# Patient Record
Sex: Female | Born: 1990 | Race: Black or African American | Hispanic: No | Marital: Single | Smoking: Never smoker
Health system: Southern US, Community
[De-identification: ages and names within clinical notes are randomized; demographics above are authoritative.]

## PROBLEM LIST (undated history)

## (undated) ENCOUNTER — Emergency Department (HOSPITAL_COMMUNITY): Discharge status: Against Medical Advice | Payer: BC Managed Care – PPO

## (undated) DIAGNOSIS — R87629 Unspecified abnormal cytological findings in specimens from vagina: Secondary | ICD-10-CM

## (undated) DIAGNOSIS — K219 Gastro-esophageal reflux disease without esophagitis: Secondary | ICD-10-CM

## (undated) DIAGNOSIS — Z8619 Personal history of other infectious and parasitic diseases: Secondary | ICD-10-CM

## (undated) HISTORY — PX: WISDOM TOOTH EXTRACTION: SHX21

---
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
## 2008-01-24 ENCOUNTER — Emergency Department (HOSPITAL_COMMUNITY): Admission: EM | Admit: 2008-01-24 | Discharge: 2008-01-24 | Payer: Self-pay | Admitting: Emergency Medicine

## 2008-07-31 ENCOUNTER — Emergency Department (HOSPITAL_COMMUNITY): Admission: EM | Admit: 2008-07-31 | Discharge: 2008-07-31 | Payer: Self-pay | Admitting: *Deleted

## 2008-08-02 ENCOUNTER — Emergency Department (HOSPITAL_COMMUNITY): Admission: EM | Admit: 2008-08-02 | Discharge: 2008-08-02 | Payer: Self-pay | Admitting: Emergency Medicine

## 2008-11-04 ENCOUNTER — Emergency Department (HOSPITAL_COMMUNITY): Admission: EM | Admit: 2008-11-04 | Discharge: 2008-11-04 | Payer: Self-pay | Admitting: Emergency Medicine

## 2008-11-05 ENCOUNTER — Emergency Department (HOSPITAL_COMMUNITY): Admission: EM | Admit: 2008-11-05 | Discharge: 2008-11-05 | Payer: Self-pay | Admitting: Emergency Medicine

## 2009-01-29 ENCOUNTER — Emergency Department (HOSPITAL_COMMUNITY): Admission: EM | Admit: 2009-01-29 | Discharge: 2009-01-30 | Payer: Self-pay | Admitting: Emergency Medicine

## 2009-02-28 ENCOUNTER — Emergency Department (HOSPITAL_COMMUNITY): Admission: EM | Admit: 2009-02-28 | Discharge: 2009-02-28 | Payer: Self-pay | Admitting: Emergency Medicine

## 2009-05-08 ENCOUNTER — Ambulatory Visit (HOSPITAL_COMMUNITY): Admission: RE | Admit: 2009-05-08 | Discharge: 2009-05-08 | Payer: Self-pay | Admitting: Obstetrics

## 2009-05-10 ENCOUNTER — Inpatient Hospital Stay (HOSPITAL_COMMUNITY): Admission: AD | Admit: 2009-05-10 | Discharge: 2009-05-10 | Payer: Self-pay | Admitting: Obstetrics

## 2009-07-22 ENCOUNTER — Ambulatory Visit (HOSPITAL_COMMUNITY): Admission: RE | Admit: 2009-07-22 | Discharge: 2009-07-22 | Payer: Self-pay | Admitting: Obstetrics

## 2009-10-01 ENCOUNTER — Inpatient Hospital Stay (HOSPITAL_COMMUNITY): Admission: AD | Admit: 2009-10-01 | Discharge: 2009-10-01 | Payer: Self-pay | Admitting: Obstetrics

## 2009-10-02 ENCOUNTER — Inpatient Hospital Stay (HOSPITAL_COMMUNITY)
Admission: AD | Admit: 2009-10-02 | Discharge: 2009-10-04 | Payer: Self-pay | Source: Home / Self Care | Admitting: Obstetrics

## 2009-10-02 ENCOUNTER — Encounter (INDEPENDENT_AMBULATORY_CARE_PROVIDER_SITE_OTHER): Payer: Self-pay | Admitting: Obstetrics

## 2010-06-21 LAB — RPR: RPR Ser Ql: NONREACTIVE

## 2010-06-21 LAB — CBC
HCT: 18.8 % — ABNORMAL LOW (ref 36.0–46.0)
HCT: 37 % (ref 36.0–46.0)
HCT: 40.4 % (ref 36.0–46.0)
Hemoglobin: 6.4 g/dL — CL (ref 12.0–15.0)
Hemoglobin: 12.3 g/dL (ref 12.0–15.0)
Hemoglobin: 13.4 g/dL (ref 12.0–15.0)
MCH: 26.7 pg (ref 26.0–34.0)
MCH: 26.7 pg (ref 26.0–34.0)
MCHC: 33.3 g/dL (ref 30.0–36.0)
MCV: 80.2 fL (ref 78.0–100.0)
MCV: 80.3 fL (ref 78.0–100.0)
RBC: 5.04 MIL/uL (ref 3.87–5.11)
WBC: 18.3 10*3/uL — ABNORMAL HIGH (ref 4.0–10.5)

## 2010-06-21 LAB — LACTATE DEHYDROGENASE: LDH: 225 U/L (ref 94–250)

## 2010-06-21 LAB — COMPREHENSIVE METABOLIC PANEL
ALT: 15 U/L (ref 0–35)
CO2: 23 mEq/L (ref 19–32)
Calcium: 8.6 mg/dL (ref 8.4–10.5)
GFR calc non Af Amer: >60 mL/min (ref >60)
Glucose, Bld: 78 mg/dL (ref 70–99)
Sodium: 134 mEq/L — ABNORMAL LOW (ref 135–145)

## 2010-06-21 LAB — MRSA PCR SCREENING: MRSA by PCR: NEGATIVE

## 2010-07-08 LAB — URINALYSIS, ROUTINE W REFLEX MICROSCOPIC
Bilirubin Urine: NEGATIVE
Hgb urine dipstick: NEGATIVE
Protein, ur: NEGATIVE mg/dL
Urobilinogen, UA: 1 mg/dL (ref 0.0–1.0)

## 2010-07-08 LAB — WET PREP, GENITAL
Clue Cells Wet Prep HPF POC: NONE SEEN
Trich, Wet Prep: NONE SEEN

## 2010-07-08 LAB — POCT PREGNANCY, URINE: Preg Test, Ur: POSITIVE

## 2010-07-08 LAB — HCG, QUANTITATIVE, PREGNANCY: hCG, Beta Chain, Quant, S: 153462 m[IU]/mL — ABNORMAL HIGH (ref <5)

## 2010-07-09 LAB — URINALYSIS, ROUTINE W REFLEX MICROSCOPIC
Nitrite: NEGATIVE
Protein, ur: NEGATIVE mg/dL
Urobilinogen, UA: 1 mg/dL (ref 0.0–1.0)

## 2010-07-09 LAB — WET PREP, GENITAL
Clue Cells Wet Prep HPF POC: NONE SEEN
Trich, Wet Prep: NONE SEEN
WBC, Wet Prep HPF POC: NONE SEEN
Yeast Wet Prep HPF POC: NONE SEEN

## 2010-07-09 LAB — RPR: RPR Ser Ql: NONREACTIVE

## 2010-07-11 LAB — URINALYSIS, ROUTINE W REFLEX MICROSCOPIC
Bilirubin Urine: NEGATIVE
Ketones, ur: NEGATIVE mg/dL
Nitrite: NEGATIVE
Protein, ur: NEGATIVE mg/dL
pH: 6 (ref 5.0–8.0)

## 2010-07-11 LAB — WET PREP, GENITAL: Yeast Wet Prep HPF POC: NONE SEEN

## 2010-07-11 LAB — GC/CHLAMYDIA PROBE AMP, GENITAL
Chlamydia, DNA Probe: NEGATIVE
GC Probe Amp, Genital: POSITIVE — AB

## 2010-07-11 LAB — URINE MICROSCOPIC-ADD ON

## 2010-07-15 LAB — COMPREHENSIVE METABOLIC PANEL
ALT: 27 U/L (ref 0–35)
AST: 21 U/L (ref 0–37)
Albumin: 4 g/dL (ref 3.5–5.2)
Albumin: 4 g/dL (ref 3.5–5.2)
Alkaline Phosphatase: 56 U/L (ref 39–117)
Alkaline Phosphatase: 65 U/L (ref 39–117)
BUN: 6 mg/dL (ref 6–23)
BUN: 11 mg/dL (ref 6–23)
Chloride: 110 mEq/L (ref 96–112)
Chloride: 103 mEq/L (ref 96–112)
Creatinine, Ser: 1.1 mg/dL (ref 0.4–1.2)
GFR calc Af Amer: >60 mL/min (ref >60)
Glucose, Bld: 106 mg/dL — ABNORMAL HIGH (ref 70–99)
Potassium: 3.6 mEq/L (ref 3.5–5.1)
Potassium: 3.2 mEq/L — ABNORMAL LOW (ref 3.5–5.1)
Sodium: 142 mEq/L (ref 135–145)
Total Bilirubin: 0.5 mg/dL (ref 0.3–1.2)
Total Bilirubin: 0.4 mg/dL (ref 0.3–1.2)
Total Protein: 7.1 g/dL (ref 6.0–8.3)
Total Protein: 7.9 g/dL (ref 6.0–8.3)

## 2010-07-15 LAB — DIFFERENTIAL
Basophils Absolute: 0.1 10*3/uL (ref 0.0–0.1)
Basophils Absolute: 0 10*3/uL (ref 0.0–0.1)
Basophils Relative: 1 % (ref 0–1)
Basophils Relative: 1 % (ref 0–1)
Eosinophils Absolute: 0 10*3/uL (ref 0.0–0.7)
Eosinophils Relative: 0 % (ref 0–5)
Lymphocytes Relative: 43 % (ref 12–46)
Monocytes Absolute: 0.2 10*3/uL (ref 0.1–1.0)
Monocytes Relative: 4 % (ref 3–12)
Neutro Abs: 5.3 10*3/uL (ref 1.7–7.7)
Neutro Abs: 2.9 10*3/uL (ref 1.7–7.7)
Neutrophils Relative %: 43 % (ref 43–77)

## 2010-07-15 LAB — CBC
HCT: 43.4 % (ref 36.0–46.0)
Hemoglobin: 13.3 g/dL (ref 12.0–15.0)
Hemoglobin: 14.2 g/dL (ref 12.0–15.0)
MCHC: 32.9 g/dL (ref 30.0–36.0)
MCV: 79.9 fL (ref 78.0–100.0)
MCV: 80.5 fL (ref 78.0–100.0)
Platelets: 315 10*3/uL (ref 150–400)
RBC: 5.05 MIL/uL (ref 3.87–5.11)
RDW: 13.5 % (ref 11.5–15.5)
RDW: 13.7 % (ref 11.5–15.5)

## 2010-07-15 LAB — URINALYSIS, ROUTINE W REFLEX MICROSCOPIC
Bilirubin Urine: NEGATIVE
Glucose, UA: NEGATIVE mg/dL
Hgb urine dipstick: NEGATIVE
Ketones, ur: NEGATIVE mg/dL
Leukocytes, UA: NEGATIVE
Nitrite: NEGATIVE
Protein, ur: NEGATIVE mg/dL
Protein, ur: NEGATIVE mg/dL
Specific Gravity, Urine: 1.031 — ABNORMAL HIGH (ref 1.005–1.030)
Urobilinogen, UA: 1 mg/dL (ref 0.0–1.0)
pH: 7.5 (ref 5.0–8.0)

## 2010-07-15 LAB — URINE MICROSCOPIC-ADD ON

## 2010-07-15 LAB — POCT I-STAT, CHEM 8
BUN: 11 mg/dL (ref 6–23)
Calcium, Ion: 1.15 mmol/L (ref 1.12–1.32)
Chloride: 104 mEq/L (ref 96–112)
Creatinine, Ser: 1.4 mg/dL — ABNORMAL HIGH (ref 0.4–1.2)
Glucose, Bld: 106 mg/dL — ABNORMAL HIGH (ref 70–99)

## 2011-12-17 ENCOUNTER — Encounter (HOSPITAL_COMMUNITY): Payer: Self-pay

## 2011-12-17 ENCOUNTER — Emergency Department (INDEPENDENT_AMBULATORY_CARE_PROVIDER_SITE_OTHER)
Admission: EM | Admit: 2011-12-17 | Discharge: 2011-12-17 | Disposition: A | Discharge status: Home / Self Care | Payer: Self-pay | Source: Home / Self Care

## 2011-12-17 DIAGNOSIS — A084 Viral intestinal infection, unspecified: Secondary | ICD-10-CM

## 2011-12-17 DIAGNOSIS — A088 Other specified intestinal infections: Secondary | ICD-10-CM

## 2011-12-17 MED ORDER — ONDANSETRON HCL 4 MG PO TABS: 4.0000 mg | ORAL_TABLET | Freq: Four times a day (QID) | ORAL | Status: AC

## 2011-12-17 NOTE — ED Notes (Signed)
C/o vomiting, diarrhea and headache since [place].  States her daughter is just getting over diarrhea but no vomiting.  Pt last vomited 2 hours ago and last diarrhea was 1 hour ago.  States some abdominal cramping when she has diarrhea or vomiting

## 2011-12-17 NOTE — ED Provider Notes (Signed)
History     CSN: [id number]  Arrival date & time 12/17/11  1254   None     Chief Complaint  Patient presents with  . Emesis  . Diarrhea    (Consider location/radiation/quality/duration/timing/severity/associated sxs/prior treatment) HPI Comments: Vomiting x 4 adn diarrheal stools x 7 since onset last PM. Her baby recently had diarrhea.   Patient is a 21 y.o. female presenting with vomiting and diarrhea. The history is provided by the patient.  Emesis  This is a new problem. The current episode started 6 to 12 hours ago. The problem occurs 5 to 10 times per day. Associated symptoms include diarrhea and headaches. Pertinent negatives include no abdominal pain, no chills, no cough, no fever, no myalgias, no sweats and no URI.  Diarrhea The primary symptoms include vomiting and diarrhea. Primary symptoms do not include fever, abdominal pain or myalgias.  The illness does not include chills.    History reviewed. No pertinent past medical history.  History reviewed. No pertinent past surgical history.  No family history on file.  History  Substance Use Topics  . Smoking status: Never Smoker   . Smokeless tobacco: Not on file  . Alcohol Use: No    OB History    Grav Para Term Preterm Abortions TAB SAB Ect Mult Living                  Review of Systems  Constitutional: Negative for fever and chills.  Respiratory: Negative for cough and shortness of breath.   Gastrointestinal: Positive for vomiting and diarrhea. Negative for abdominal pain.       Per HPI  Genitourinary: Negative.   Musculoskeletal: Negative.  Negative for myalgias.  Skin: Negative.   Neurological: Positive for weakness and headaches. Negative for dizziness and numbness.    Allergies  Review of patient's allergies indicates no known allergies.  Home Medications   Current Outpatient Rx  Name Route Sig Dispense Refill  . ONDANSETRON HCL 4 MG PO TABS Oral Take 1 tablet (4 mg total) by mouth every 6  (six) hours. 12 tablet 0    BP 116/75  Pulse 85  Temp 98.1 F (36.7 C) (Oral)  Resp 16  SpO2 97%  Physical Exam  Constitutional: She is oriented to person, place, and time. She appears well-developed and well-nourished. No distress.  HENT:  Mouth/Throat: Oropharynx is clear and moist. No oropharyngeal exudate.  Neck: Normal range of motion. Neck supple.  Cardiovascular: Normal rate and normal heart sounds.   Pulmonary/Chest: Effort normal and breath sounds normal. No respiratory distress.  Abdominal: Soft. She exhibits no distension and no mass. There is no tenderness. There is no rebound and no guarding.  Musculoskeletal: Normal range of motion. She exhibits no edema.  Lymphadenopathy:    She has no cervical adenopathy.  Neurological: She is alert and oriented to person, place, and time. No cranial nerve deficit.  Skin: Skin is warm and dry.    ED Course  Procedures (including critical care time)  Labs Reviewed - No data to display No results found.   1. Viral gastroenteritis       MDM  No working for 3 d immodium AD to slow but not stop diarrhea BRAT diet Zofran 4mg  for nausea Pedialyte, small frequent amounts.         Hayden Rasmussen, NP 12/17/11 1345

## 2011-12-18 NOTE — ED Provider Notes (Signed)
Medical screening examination/treatment/procedure(s) were performed by non-physician practitioner and as supervising physician I was immediately available for consultation/collaboration.  Raynald Blend, MD 12/18/11 1145

## 2011-12-26 ENCOUNTER — Encounter (HOSPITAL_COMMUNITY): Payer: Self-pay | Admitting: Emergency Medicine

## 2011-12-26 ENCOUNTER — Emergency Department (HOSPITAL_COMMUNITY)
Admission: EM | Admit: 2011-12-26 | Discharge: 2011-12-26 | Disposition: A | Discharge status: Home / Self Care | Payer: Self-pay | Attending: Emergency Medicine | Admitting: Emergency Medicine

## 2011-12-26 DIAGNOSIS — J069 Acute upper respiratory infection, unspecified: Secondary | ICD-10-CM | POA: Insufficient documentation

## 2011-12-26 MED ORDER — IBUPROFEN 800 MG PO TABS: 800.0000 mg | ORAL_TABLET | Freq: Three times a day (TID) | ORAL | Status: DC

## 2011-12-26 NOTE — ED Notes (Signed)
Pt reports new onset of cough that is causing chest wall and back pain

## 2011-12-26 NOTE — ED Provider Notes (Signed)
History     CSN: [id number]  Arrival date & time 12/26/11  1350   First MD Initiated Contact with Patient 12/26/11 1505      Chief Complaint  Patient presents with  . Back Pain  . Cough  . Chest Pain    with cough  . Chills    x 1 day    (Consider location/radiation/quality/duration/timing/severity/associated sxs/prior treatment) HPI Comments: 21 year old female presents the emergency department complaining of flulike symptoms as of last night. States she began having a slightly productive cough of clear phlegm last night that's causing chest pain only which coughs. Admits to bodyaches and fever of 100 last night. She has not tried any alleviating factors. Shortness of breath, nausea, vomiting, diarrhea, sore throat. Denies any sick contacts.  Patient is a 21 y.o. female presenting with back pain, cough, and chest pain. The history is provided by the patient.  Back Pain  Associated symptoms include chest pain and a fever. Pertinent negatives include no headaches.  Cough Associated symptoms include chest pain. Pertinent negatives include no ear pain, no headaches, no sore throat and no shortness of breath.  Chest Pain Primary symptoms include a fever, fatigue and cough. Pertinent negatives for primary symptoms include no shortness of breath, no nausea and no vomiting.     History reviewed. No pertinent past medical history.  History reviewed. No pertinent past surgical history.  Family History  Problem Relation Age of Onset  . Hypertension Other     History  Substance Use Topics  . Smoking status: Never Smoker   . Smokeless tobacco: Not on file  . Alcohol Use: No    OB History    Grav Para Term Preterm Abortions TAB SAB Ect Mult Living                  Review of Systems  Constitutional: Positive for fever, appetite change and fatigue.  HENT: Negative for ear pain, congestion, sore throat, neck pain, neck stiffness and sinus pressure.   Respiratory: Positive for  cough. Negative for shortness of breath.   Cardiovascular: Positive for chest pain.  Gastrointestinal: Negative for nausea and vomiting.  Musculoskeletal: Positive for arthralgias.  Skin: Negative for color change and rash.  Neurological: Negative for headaches.    Allergies  Review of patient's allergies indicates no known allergies.  Home Medications  No current outpatient prescriptions on file.  BP 126/82  Pulse 109  Temp 99.1 F (37.3 C) (Oral)  Resp 18  SpO2 97%  Physical Exam  Nursing note and vitals reviewed. Constitutional: She is oriented to person, place, and time. She appears well-developed and well-nourished. No distress.  HENT:  Head: Normocephalic and atraumatic.  Nose: Mucosal edema (buggy turbinates) present.  Mouth/Throat: Oropharynx is clear and moist and mucous membranes are normal. No oropharyngeal exudate or posterior oropharyngeal edema.  Eyes: Conjunctivae normal are normal.  Neck: Normal range of motion. Neck supple.  Cardiovascular: Normal rate, regular rhythm and normal heart sounds.   Pulmonary/Chest: Effort normal and breath sounds normal. She has no wheezes.  Abdominal: Soft. Bowel sounds are normal. There is no tenderness.  Musculoskeletal: Normal range of motion.  Lymphadenopathy:       Head (right side): No submandibular and no tonsillar adenopathy present.       Head (left side): No submandibular and no tonsillar adenopathy present.    She has no cervical adenopathy.  Neurological: She is alert and oriented to person, place, and time.  Skin: Skin is warm  and dry. No rash noted. She is not diaphoretic.  Psychiatric: She has a normal mood and affect. Her behavior is normal.    ED Course  Procedures (including critical care time)  Labs Reviewed - No data to display No results found.   1. Viral upper respiratory illness       MDM  21 year old female with flulike symptoms. She is in no apparent distress. No signs of infection on  exam. Lungs are clear. No cough during examination. Advised conservative treatment including Tylenol, ibuprofen, lots of fluids. Patient states her understanding and is agreeable to plan.        Trevor Mace, PA-C 12/26/11 1533

## 2011-12-28 NOTE — ED Provider Notes (Signed)
Medical screening examination/treatment/procedure(s) were performed by non-physician practitioner and as supervising physician I was immediately available for consultation/collaboration.  Flint Melter, MD 12/28/11 [phone]

## 2011-12-31 ENCOUNTER — Emergency Department (HOSPITAL_COMMUNITY): Payer: Self-pay

## 2011-12-31 ENCOUNTER — Emergency Department (HOSPITAL_COMMUNITY)
Admission: EM | Admit: 2011-12-31 | Discharge: 2011-12-31 | Disposition: A | Discharge status: Home / Self Care | Payer: Self-pay | Attending: Emergency Medicine | Admitting: Emergency Medicine

## 2011-12-31 DIAGNOSIS — M25519 Pain in unspecified shoulder: Secondary | ICD-10-CM | POA: Insufficient documentation

## 2011-12-31 DIAGNOSIS — S139XXA Sprain of joints and ligaments of unspecified parts of neck, initial encounter: Secondary | ICD-10-CM | POA: Insufficient documentation

## 2011-12-31 DIAGNOSIS — S335XXA Sprain of ligaments of lumbar spine, initial encounter: Secondary | ICD-10-CM | POA: Insufficient documentation

## 2011-12-31 DIAGNOSIS — M549 Dorsalgia, unspecified: Secondary | ICD-10-CM

## 2011-12-31 MED ORDER — CYCLOBENZAPRINE HCL 10 MG PO TABS: 10.0000 mg | ORAL_TABLET | Freq: Two times a day (BID) | ORAL | Status: DC | PRN

## 2011-12-31 MED ORDER — IBUPROFEN 800 MG PO TABS: 800.0000 mg | ORAL_TABLET | Freq: Three times a day (TID) | ORAL | Status: DC
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
## 2011-12-31 NOTE — ED Provider Notes (Signed)
Medical screening examination/treatment/procedure(s) were conducted as a shared visit with non-physician practitioner(s) and myself.  I personally evaluated the patient during the encounter  With the patient has mild tenderness to her chest wall bilaterally and requested chest x-ray, she is no cervical spine midline tenderness no midline back tenderness she has mild paracervical and trapezius soft tissue tenderness with minimal if any tenderness to her bilateral a.c. joints with good active range of motion of her shoulders was negative drop test of her shoulders. CXR ordered.  Hurman Horn, MD 01/01/12 2117
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
## 2011-12-31 NOTE — ED Notes (Signed)
MD at bedside. 
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
## 2012-01-12 ENCOUNTER — Emergency Department (INDEPENDENT_AMBULATORY_CARE_PROVIDER_SITE_OTHER)
Admission: EM | Admit: 2012-01-12 | Discharge: 2012-01-12 | Disposition: A | Discharge status: Home / Self Care | Payer: BC Managed Care – PPO | Source: Home / Self Care

## 2012-01-12 ENCOUNTER — Encounter (HOSPITAL_COMMUNITY): Payer: Self-pay | Admitting: *Deleted

## 2012-01-12 DIAGNOSIS — L259 Unspecified contact dermatitis, unspecified cause: Secondary | ICD-10-CM

## 2012-01-12 MED ORDER — METHYLPREDNISOLONE 4 MG PO KIT: PACK | ORAL | Status: DC

## 2012-01-12 MED ORDER — TRIAMCINOLONE ACETONIDE 40 MG/ML IJ SUSP
40.0000 mg | Freq: Once | INTRAMUSCULAR | Status: AC
  Administered 2012-01-12: 40 mg via INTRAMUSCULAR

## 2012-01-12 MED ORDER — TRIAMCINOLONE ACETONIDE 0.1 % EX CREA: TOPICAL_CREAM | Freq: Two times a day (BID) | CUTANEOUS | Status: DC

## 2012-01-12 MED ORDER — TRIAMCINOLONE ACETONIDE 40 MG/ML IJ SUSP
INTRAMUSCULAR | Status: AC
  Filled 2012-01-12: qty 5

## 2012-01-12 NOTE — ED Notes (Signed)
Red raised rash to L posterior shoulder and L lat chest wall onset 2 weeks ago with itching.  No pain.  States warm water makes it worse.

## 2012-01-12 NOTE — ED Provider Notes (Signed)
History     CSN: [id number]  Arrival date & time 01/12/12  1808   None     No chief complaint on file.   (Consider location/radiation/quality/duration/timing/severity/associated sxs/prior treatment) HPI Comments: 21 year old female presents with a rash for 2 weeks. Is a highly pruritic rash located on the left neck and upper back. It consists primarily of isolated papules some crops closely together in an annular pattern and extends from the left upper back to the left lower posterior lateral back. She denies other body surface areas with itching or rash. She denies any known exposure to allergens. She does have a long hair summer which in dreadlocks that extend down to the end of the rash. This was initially appear to be a potential calls from hair chemicals however it is not present on the right side of the back. There are no vesicles and there is no pain or tenderness.   No past medical history on file.  No past surgical history on file.  Family History  Problem Relation Age of Onset  . Hypertension Other     History  Substance Use Topics  . Smoking status: Never Smoker   . Smokeless tobacco: Not on file  . Alcohol Use: No    OB History    Grav Para Term Preterm Abortions TAB SAB Ect Mult Living                  Review of Systems  Constitutional: Negative for fever, activity change and fatigue.  HENT: Negative.   Respiratory: Negative.   Gastrointestinal: Negative.   Musculoskeletal: Negative.   Skin: Positive for rash.    Allergies  Review of patient's allergies indicates no known allergies.  Home Medications   Current Outpatient Rx  Name Route Sig Dispense Refill  . CYCLOBENZAPRINE HCL 10 MG PO TABS Oral Take 1 tablet (10 mg total) by mouth 2 (two) times daily as needed for muscle spasms. 20 tablet 0  . IBUPROFEN 800 MG PO TABS Oral Take 1 tablet (800 mg total) by mouth 3 (three) times daily. 21 tablet 0  . METHYLPREDNISOLONE 4 MG PO KIT  As directed.  Start 01/13/12 21 tablet 0  . TRIAMCINOLONE ACETONIDE 0.1 % EX CREA Topical Apply topically 2 (two) times daily. Apply for 2 weeks. May use on face 30 g 0    BP 105/65  Pulse 80  Temp 98.4 F (36.9 C) (Oral)  Resp 16  SpO2 100%  Physical Exam  Constitutional: She is oriented to person, place, and time. She appears well-developed and well-nourished. No distress.  Eyes: EOM are normal. Right eye exhibits no discharge. Left eye exhibits no discharge.  Neck: Normal range of motion. Neck supple.  Pulmonary/Chest: Effort normal.  Musculoskeletal: Normal range of motion. She exhibits no edema.  Neurological: She is alert and oriented to person, place, and time. No cranial nerve deficit. She exhibits normal muscle tone. Coordination normal.  Skin: Skin is warm and dry. Rash noted. No erythema.       Flesh-colored papular rash left upper back extending to the left lower posterior lateral back. The left upper back and shoulder as a dancer population of papules some in annular form. There is no flaking or desquamation or central clearing. No rash to the body surface areas.  Psychiatric: She has a normal mood and affect.    ED Course  Procedures (including critical care time)  Labs Reviewed - No data to display No results found.   1. Contact  dermatitis       MDM  Kenalog 40 mg IM now Medrol Dosepak start tomorrow Triamcinolone 0.1% cream apply to affected area twice a day Claritin 10 mg by mouth daily when necessary itching.        Hayden Rasmussen, NP 01/12/12 1906

## 2012-01-12 NOTE — ED Provider Notes (Signed)
Medical screening examination/treatment/procedure(s) were performed by non-physician practitioner and as supervising physician I was immediately available for consultation/collaboration.  Leslee Home, M.D.   Reuben Likes, MD 01/12/12 2256

## 2012-01-12 NOTE — ED Notes (Signed)
States she thought it was ringworm and applied Tinactin cream for athletes foot without relief.

## 2012-08-09 ENCOUNTER — Emergency Department (HOSPITAL_COMMUNITY): Payer: BC Managed Care – PPO

## 2012-08-09 ENCOUNTER — Encounter (HOSPITAL_COMMUNITY): Payer: Self-pay | Admitting: *Deleted

## 2012-08-09 ENCOUNTER — Emergency Department (HOSPITAL_COMMUNITY)
Admission: EM | Admit: 2012-08-09 | Discharge: 2012-08-09 | Disposition: A | Discharge status: Home / Self Care | Payer: BC Managed Care – PPO | Attending: Emergency Medicine | Admitting: Emergency Medicine

## 2012-08-09 DIAGNOSIS — K219 Gastro-esophageal reflux disease without esophagitis: Secondary | ICD-10-CM | POA: Insufficient documentation

## 2012-08-09 DIAGNOSIS — R12 Heartburn: Secondary | ICD-10-CM | POA: Insufficient documentation

## 2012-08-09 MED ORDER — GI COCKTAIL ~~LOC~~
30.0000 mL | Freq: Once | ORAL | Status: AC
  Administered 2012-08-09: 30 mL via ORAL
  Filled 2012-08-09: qty 30

## 2012-08-09 MED ORDER — RANITIDINE HCL 150 MG PO TABS: 150.0000 mg | ORAL_TABLET | Freq: Two times a day (BID) | ORAL | Status: DC

## 2012-08-09 NOTE — ED Notes (Signed)
MD at bedside. Dr. Plunkett at bedside.  
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
## 2012-08-09 NOTE — ED Provider Notes (Signed)
History     CSN: [id number]  Arrival date & time 08/09/12  1318   First MD Initiated Contact with Patient 08/09/12 1552      Chief Complaint  Patient presents with  . Chest Pain    (Consider location/radiation/quality/duration/timing/severity/associated sxs/prior treatment) Patient is a 22 y.o. female presenting with chest pain. The history is provided by the patient.  Chest Pain Pain location:  Substernal area Pain quality: burning   Pain radiates to:  Does not radiate Pain radiates to the back: no   Pain severity:  Moderate Onset quality:  Gradual Duration:  1 week Timing:  Constant Progression:  Worsening Chronicity:  Recurrent Context: eating   Context: not breathing, not lifting and no movement   Relieved by:  Nothing Exacerbated by: eating and lying down. Ineffective treatments:  None tried Associated symptoms: heartburn   Associated symptoms: no abdominal pain, no cough, no fever, no headache, no lower extremity edema, no nausea, no palpitations, no shortness of breath and not vomiting   Risk factors: birth control   Risk factors: no coronary artery disease, no high cholesterol, no hypertension, not obese, not pregnant and no smoking     History reviewed. No pertinent past medical history.  History reviewed. No pertinent past surgical history.  Family History  Problem Relation Age of Onset  . Hypertension Other     History  Substance Use Topics  . Smoking status: Never Smoker   . Smokeless tobacco: Not on file  . Alcohol Use: No    OB History   Grav Para Term Preterm Abortions TAB SAB Ect Mult Living                  Review of Systems  Constitutional: Negative for fever.  Respiratory: Negative for cough and shortness of breath.   Cardiovascular: Positive for chest pain. Negative for palpitations.  Gastrointestinal: Positive for heartburn. Negative for nausea, vomiting and abdominal pain.  Neurological: Negative for headaches.  All other systems  reviewed and are negative.    Allergies  Review of patient's allergies indicates no known allergies.  Home Medications   Current Outpatient Rx  Name  Route  Sig  Dispense  Refill  . cyclobenzaprine (FLEXERIL) 10 MG tablet   Oral   Take 1 tablet (10 mg total) by mouth 2 (two) times daily as needed for muscle spasms.   20 tablet   0   . ibuprofen (ADVIL,MOTRIN) 800 MG tablet   Oral   Take 1 tablet (800 mg total) by mouth 3 (three) times daily.   21 tablet   0   . methylPREDNISolone (MEDROL DOSEPAK) 4 MG tablet      As directed. Start 01/13/12   21 tablet   0   . triamcinolone cream (KENALOG) 0.1 %   Topical   Apply topically 2 (two) times daily. Apply for 2 weeks. May use on face   30 g   0     BP 108/72  Pulse 68  Temp(Src) 99.4 F (37.4 C) (Oral)  Resp 20  Wt 120 lb (54.432 kg)  SpO2 100%  Physical Exam  Nursing note and vitals reviewed. Constitutional: She is oriented to person, place, and time. She appears well-developed and well-nourished. No distress.  HENT:  Head: Normocephalic and atraumatic.  Mouth/Throat: Oropharynx is clear and moist.  Eyes: Conjunctivae and EOM are normal. Pupils are equal, round, and reactive to light.  Neck: Normal range of motion. Neck supple.  Cardiovascular: Normal rate, regular rhythm  and intact distal pulses.   No murmur heard. Pulmonary/Chest: Effort normal and breath sounds normal. No respiratory distress. She has no wheezes. She has no rales.  Abdominal: Soft. She exhibits no distension. There is no tenderness. There is no rebound and no guarding.  Musculoskeletal: Normal range of motion. She exhibits no edema and no tenderness.  Neurological: She is alert and oriented to person, place, and time.  Skin: Skin is warm and dry. No rash noted. No erythema.  Psychiatric: She has a normal mood and affect. Her behavior is normal.    ED Course  Procedures (including critical care time)  Labs Reviewed  POCT I-STAT TROPONIN  I   Dg Chest 2 View  08/09/2012  *RADIOLOGY REPORT*  Clinical Data: Chest pain  CHEST - 2 VIEW  Comparison:  December 31, 2011  Findings:  The lungs are clear.  The heart size and pulmonary vascularity are normal.  No adenopathy.  There is mid thoracic dextroscoliosis.  No pneumothorax.  IMPRESSION: No edema or consolidation.   Original Report Authenticated By: Bretta Bang, M.D.      Date: 08/09/2012  Rate: 62  Rhythm: normal sinus rhythm  QRS Axis: right  Intervals: normal  ST/T Wave abnormalities: normal  Conduction Disutrbances:none  Narrative Interpretation:   Old EKG Reviewed: none available   No diagnosis found.    MDM   Patient is here complaining of a burning sensation in her substernal area that is worse with eating spicy foods and heavy meals. She denies any shortness of breath, cough or fever. She states this has been ongoing for about one week and the only thing that makes this worse his eating and lying down. She denies any history of NSAID, aspirin goody powder use recently.  Normal-appearing with no epigastric pain. Patient is much improved after GI cocktail. Will be discharged home with Zantac. EKG is within normal limits, chest x-ray is normal an i-STAT troponin done in triage was negative        Gwyneth Sprout, MD 08/09/12 1610

## 2012-08-09 NOTE — ED Notes (Signed)
Pt c/o sternal/right chest pain x's 1 week. Reports hx of acid reflux. Reports pain is burning and tight.

## 2013-08-27 ENCOUNTER — Emergency Department (INDEPENDENT_AMBULATORY_CARE_PROVIDER_SITE_OTHER)
Admission: EM | Admit: 2013-08-27 | Discharge: 2013-08-27 | Disposition: A | Discharge status: Home / Self Care | Payer: Medicaid Other | Source: Home / Self Care

## 2013-08-27 ENCOUNTER — Encounter (HOSPITAL_COMMUNITY): Payer: Self-pay | Admitting: Emergency Medicine

## 2013-08-27 DIAGNOSIS — M25572 Pain in left ankle and joints of left foot: Secondary | ICD-10-CM

## 2013-08-27 DIAGNOSIS — M25579 Pain in unspecified ankle and joints of unspecified foot: Secondary | ICD-10-CM | POA: Diagnosis not present

## 2013-08-27 MED ORDER — DICLOFENAC POTASSIUM 50 MG PO TABS: 50.0000 mg | ORAL_TABLET | Freq: Three times a day (TID) | ORAL | Status: DC

## 2013-08-27 MED ORDER — TRAMADOL HCL 50 MG PO TABS: 50.0000 mg | ORAL_TABLET | Freq: Four times a day (QID) | ORAL | Status: DC | PRN

## 2013-08-27 NOTE — ED Provider Notes (Signed)
CSN: [id number]     Arrival date & time 08/27/13  1122 History   First MD Initiated Contact with Patient 08/27/13 1316     Chief Complaint  Patient presents with  . Foot Pain   (Consider location/radiation/quality/duration/timing/severity/associated sxs/prior Treatment) HPI Comments: 23 year old female is complaining of left ankle pain for 24 hours. She denies any known injury. She did not twist her ankle, fall or injure it in any way she is aware of. Her job requires her to stand 8 hours a day 5 days a week. She started this new job 4 weeks ago. She states that she wears a walking type cheese and not high heels. Pain is located in the ankle. There is no pain and the forefoot or over the metatarsals. Yesterday she states that she had full range of motion, but  today it is too painful to obtain a range of motion.   History reviewed. No pertinent past medical history. History reviewed. No pertinent past surgical history. Family History  Problem Relation Age of Onset  . Hypertension Other    History  Substance Use Topics  . Smoking status: Never Smoker   . Smokeless tobacco: Not on file  . Alcohol Use: No   OB History   Grav Para Term Preterm Abortions TAB SAB Ect Mult Living                 Review of Systems  Constitutional: Positive for activity change. Negative for fever.  HENT: Negative.   Respiratory: Negative for shortness of breath.   Musculoskeletal:       As per HPI  Skin: Negative for color change and pallor.  Neurological: Negative.     Allergies  Review of patient's allergies indicates no known allergies.  Home Medications   Prior to Admission medications   Medication Sig Start Date End Date Taking? Authorizing Provider  cyclobenzaprine (FLEXERIL) 10 MG tablet Take 1 tablet (10 mg total) by mouth 2 (two) times daily as needed for muscle spasms. 12/31/11   Trevor Maceobyn M Albert, PA-C  ibuprofen (ADVIL,MOTRIN) 800 MG tablet Take 1 tablet (800 mg total) by mouth 3 (three)  times daily. 12/31/11   Trevor Maceobyn M Albert, PA-C  methylPREDNISolone (MEDROL DOSEPAK) 4 MG tablet As directed. Start 01/13/12 01/12/12   Hayden Rasmussenavid Charl Wellen, NP  ranitidine (ZANTAC) 150 MG tablet Take 1 tablet (150 mg total) by mouth 2 (two) times daily. 08/09/12   Gwyneth SproutWhitney Plunkett, MD  triamcinolone cream (KENALOG) 0.1 % Apply topically 2 (two) times daily. Apply for 2 weeks. May use on face 01/12/12   Hayden Rasmussenavid Jacquise Rarick, NP   BP 124/66  Pulse 82  Temp(Src) 99 F (37.2 C) (Oral)  Resp 16  SpO2 100%  LMP 08/07/2013 Physical Exam  Nursing note and vitals reviewed. Constitutional: She is oriented to person, place, and time. She appears well-developed and well-nourished. No distress.  HENT:  Head: Normocephalic and atraumatic.  Eyes: EOM are normal.  Neck: Normal range of motion. Neck supple.  Cardiovascular: Normal rate.   Pulmonary/Chest: Effort normal. No respiratory distress.  Musculoskeletal:  Puffiness surrounding the medial and lateral malleolus. No bony tenderness. Tenderness to the soft tissues of the ankle. Tenderness to the Achilles tendon. No discoloration, ecchymosis or erythema. No deformity. Distal neurovascular and sensory is intact. Able to move her toes. Toes are warm with brisk capillary refill. Pedal pulse 2+.  Neurological: She is alert and oriented to person, place, and time. No cranial nerve deficit.  Skin: Skin is warm and dry.  ED Course  Procedures (including critical care time) Labs Review Labs Reviewed - No data to display  Imaging Review No results found.   MDM   1. Left ankle pain     Probably overuse injury, new job with prolonged standing. Possibly may have experienced a mild sprain not noticed yesterday and after walking on it remainder of day it has manifested itself.  RICE ACE      Hayden Rasmussen, NP 08/27/13 1335

## 2013-08-27 NOTE — ED Notes (Signed)
C/o left foot pain.  On set yesterday.  Pt states that she started a new job and has been on her feet a lot.  Denies any known injury.  No relief with otc pain meds.

## 2013-08-27 NOTE — Discharge Instructions (Signed)
Ankle Pain Rest, ice, elevation, limit weight bearing for a few days. Ankle pain is a common symptom. The bones, cartilage, tendons, and muscles of the ankle joint perform a lot of work each day. The ankle joint holds your body weight and allows you to move around. Ankle pain can occur on either side or back of 1 or both ankles. Ankle pain may be sharp and burning or dull and aching. There may be tenderness, stiffness, redness, or warmth around the ankle. The pain occurs more often when a person walks or puts pressure on the ankle. CAUSES  There are many reasons ankle pain can develop. It is important to work with your caregiver to identify the cause since many conditions can impact the bones, cartilage, muscles, and tendons. Causes for ankle pain include:  Injury, including a break (fracture), sprain, or strain often due to a fall, sports, or a high-impact activity.  Swelling (inflammation) of a tendon (tendonitis).  Achilles tendon rupture.  Ankle instability after repeated sprains and strains.  Poor foot alignment.  Pressure on a nerve (tarsal tunnel syndrome).  Arthritis in the ankle or the lining of the ankle.  Crystal formation in the ankle (gout or pseudogout). DIAGNOSIS  A diagnosis is based on your medical history, your symptoms, results of your physical exam, and results of diagnostic tests. Diagnostic tests may include X-ray exams or a computerized magnetic scan (magnetic resonance imaging, MRI). TREATMENT  Treatment will depend on the cause of your ankle pain and may include:  Keeping pressure off the ankle and limiting activities.  Using crutches or other walking support (a cane or brace).  Using rest, ice, compression, and elevation.  Participating in physical therapy or home exercises.  Wearing shoe inserts or special shoes.  Losing weight.  Taking medications to reduce pain or swelling or receiving an injection.  Undergoing surgery. HOME CARE INSTRUCTIONS    Only take over-the-counter or prescription medicines for pain, discomfort, or fever as directed by your caregiver.  Put ice on the injured area.  Put ice in a plastic bag.  Place a towel between your skin and the bag.  Leave the ice on for 15-20 minutes at a time, 03-04 times a day.  Keep your leg raised (elevated) when possible to lessen swelling.  Avoid activities that cause ankle pain.  Follow specific exercises as directed by your caregiver.  Record how often you have ankle pain, the location of the pain, and what it feels like. This information may be helpful to you and your caregiver.  Ask your caregiver about returning to work or sports and whether you should drive.  Follow up with your caregiver for further examination, therapy, or testing as directed. SEEK MEDICAL CARE IF:   Pain or swelling continues or worsens beyond 1 week.  You have an oral temperature above 102 F (38.9 C).  You are feeling unwell or have chills.  You are having an increasingly difficult time with walking.  You have loss of sensation or other new symptoms.  You have questions or concerns. MAKE SURE YOU:   Understand these instructions.  Will watch your condition.  Will get help right away if you are not doing well or get worse. Document Released: 09/09/2009 Document Revised: 06/14/2011 Document Reviewed: 09/09/2009 [facility] Patient Information 2014 [place], [place].

## 2013-08-29 NOTE — ED Provider Notes (Signed)
Medical screening examination/treatment/procedure(s) were performed by a resident physician or non-physician practitioner and as the supervising physician I was immediately available for consultation/collaboration.  Ellyce Lafevers, MD    Nichol Ator S Milaina Sher, MD 08/29/13 0747 

## 2013-12-29 ENCOUNTER — Encounter (HOSPITAL_COMMUNITY): Payer: Self-pay | Admitting: Emergency Medicine

## 2013-12-29 ENCOUNTER — Emergency Department (HOSPITAL_COMMUNITY)
Admission: EM | Admit: 2013-12-29 | Discharge: 2013-12-29 | Disposition: A | Discharge status: Home / Self Care | Payer: Medicaid Other | Attending: Emergency Medicine | Admitting: Emergency Medicine

## 2013-12-29 ENCOUNTER — Emergency Department (HOSPITAL_COMMUNITY): Payer: Medicaid Other

## 2013-12-29 DIAGNOSIS — Y92410 Unspecified street and highway as the place of occurrence of the external cause: Secondary | ICD-10-CM | POA: Insufficient documentation

## 2013-12-29 DIAGNOSIS — Z791 Long term (current) use of non-steroidal anti-inflammatories (NSAID): Secondary | ICD-10-CM | POA: Diagnosis not present

## 2013-12-29 DIAGNOSIS — S199XXA Unspecified injury of neck, initial encounter: Secondary | ICD-10-CM | POA: Diagnosis not present

## 2013-12-29 DIAGNOSIS — S298XXA Other specified injuries of thorax, initial encounter: Secondary | ICD-10-CM | POA: Diagnosis not present

## 2013-12-29 DIAGNOSIS — I10 Essential (primary) hypertension: Secondary | ICD-10-CM | POA: Diagnosis not present

## 2013-12-29 DIAGNOSIS — IMO0002 Reserved for concepts with insufficient information to code with codable children: Secondary | ICD-10-CM | POA: Diagnosis not present

## 2013-12-29 DIAGNOSIS — Y9389 Activity, other specified: Secondary | ICD-10-CM | POA: Diagnosis not present

## 2013-12-29 DIAGNOSIS — Z79899 Other long term (current) drug therapy: Secondary | ICD-10-CM | POA: Insufficient documentation

## 2013-12-29 DIAGNOSIS — S0993XA Unspecified injury of face, initial encounter: Secondary | ICD-10-CM | POA: Diagnosis not present

## 2013-12-29 DIAGNOSIS — F411 Generalized anxiety disorder: Secondary | ICD-10-CM | POA: Insufficient documentation

## 2013-12-29 HISTORY — DX: Gastro-esophageal reflux disease without esophagitis: K21.9

## 2013-12-29 MED ORDER — HYDROCODONE-ACETAMINOPHEN 5-325 MG PO TABS
2.0000 | ORAL_TABLET | Freq: Once | ORAL | Status: DC
  Filled 2013-12-29: qty 2

## 2013-12-29 MED ORDER — HYDROCODONE-ACETAMINOPHEN 5-325 MG PO TABS: 1.0000 | ORAL_TABLET | ORAL | Status: DC | PRN

## 2013-12-29 MED ORDER — MORPHINE SULFATE 4 MG/ML IJ SOLN
4.0000 mg | Freq: Once | INTRAMUSCULAR | Status: AC
  Administered 2013-12-29: 4 mg via INTRAVENOUS
  Filled 2013-12-29: qty 1
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
## 2014-02-05 LAB — CYTOLOGY - PAP: PAP SMEAR: NEGATIVE

## 2014-07-05 DIAGNOSIS — Z8619 Personal history of other infectious and parasitic diseases: Secondary | ICD-10-CM

## 2014-07-05 HISTORY — DX: Personal history of other infectious and parasitic diseases: Z86.19

## 2014-07-27 ENCOUNTER — Inpatient Hospital Stay (HOSPITAL_COMMUNITY)
Admission: AD | Admit: 2014-07-27 | Discharge: 2014-07-27 | Disposition: A | Discharge status: Home / Self Care | Payer: Medicaid Other | Source: Ambulatory Visit | Attending: Obstetrics | Admitting: Obstetrics

## 2014-07-27 ENCOUNTER — Encounter (HOSPITAL_COMMUNITY): Payer: Self-pay | Admitting: *Deleted

## 2014-07-27 DIAGNOSIS — A5901 Trichomonal vulvovaginitis: Secondary | ICD-10-CM

## 2014-07-27 DIAGNOSIS — Z202 Contact with and (suspected) exposure to infections with a predominantly sexual mode of transmission: Secondary | ICD-10-CM | POA: Diagnosis not present

## 2014-07-27 DIAGNOSIS — R109 Unspecified abdominal pain: Secondary | ICD-10-CM | POA: Diagnosis present

## 2014-07-27 DIAGNOSIS — Z113 Encounter for screening for infections with a predominantly sexual mode of transmission: Secondary | ICD-10-CM | POA: Diagnosis not present

## 2014-07-27 LAB — URINALYSIS, ROUTINE W REFLEX MICROSCOPIC
BILIRUBIN URINE: NEGATIVE
GLUCOSE, UA: NEGATIVE mg/dL
KETONES UR: NEGATIVE mg/dL
LEUKOCYTES UA: NEGATIVE
Nitrite: NEGATIVE
PH: 5.5 (ref 5.0–8.0)
Protein, ur: NEGATIVE mg/dL
Specific Gravity, Urine: <1.005 — ABNORMAL LOW (ref 1.005–1.030)
Urobilinogen, UA: 0.2 mg/dL (ref 0.0–1.0)

## 2014-07-27 LAB — POCT PREGNANCY, URINE: Preg Test, Ur: NEGATIVE

## 2014-07-27 LAB — CBC
HEMATOCRIT: 37.3 % (ref 36.0–46.0)
Hemoglobin: 12.3 g/dL (ref 12.0–15.0)
MCH: 26.8 pg (ref 26.0–34.0)
MCHC: 33 g/dL (ref 30.0–36.0)
MCV: 81.3 fL (ref 78.0–100.0)
PLATELETS: 238 10*3/uL (ref 150–400)
RBC: 4.59 MIL/uL (ref 3.87–5.11)
RDW: 13.6 % (ref 11.5–15.5)
WBC: 7.4 10*3/uL (ref 4.0–10.5)

## 2014-07-27 LAB — URINE MICROSCOPIC-ADD ON

## 2014-07-27 LAB — WET PREP, GENITAL: Clue Cells Wet Prep HPF POC: NONE SEEN

## 2014-07-27 MED ORDER — METRONIDAZOLE 500 MG PO TABS
2000.0000 mg | ORAL_TABLET | Freq: Once | ORAL | Status: AC
  Administered 2014-07-27: 2000 mg via ORAL
  Filled 2014-07-27: qty 4

## 2014-07-27 MED ORDER — ONDANSETRON HCL 4 MG PO TABS
4.0000 mg | ORAL_TABLET | Freq: Once | ORAL | Status: AC
  Administered 2014-07-27: 4 mg via ORAL
  Filled 2014-07-27: qty 1

## 2014-07-27 MED ORDER — AZITHROMYCIN 250 MG PO TABS
1000.0000 mg | ORAL_TABLET | Freq: Once | ORAL | Status: AC
  Administered 2014-07-27: 1000 mg via ORAL
  Filled 2014-07-27: qty 4

## 2014-07-27 NOTE — MAU Provider Note (Signed)
Chief Complaint: Abdominal Pain   First Provider Initiated Contact with Patient 07/27/14 2015      SUBJECTIVE HPI: Sara Higgins is a 24 y.o. female who presents to Maternity Admissions reporting Chlamydia exposure, mild cramping and irregular vaginal bleeding. Boyfriend was recently incarcerated and tested positive for chlamydia. Patient requesting treatment. She uses Depo-Provera for birth control. Last injection was around February 14. Has been having irregular bleeding, cramping and spotting ever since. Has been on Depo-Provera Provera before and did not have irregular bleeding. Hasn't taken anything for the pain. There are no aggravating or alleviating factors.  Denies fever, chills or dyspareunia. Not currently having any abdominal pain.  Past Medical History  Diagnosis Date  . Acid reflux    OB History  No data available   History reviewed. No pertinent past surgical history. History   Social History  . Marital Status: Single    Spouse Name: N/A  . Number of Children: N/A  . Years of Education: N/A   Occupational History  . Not on file.   Social History Main Topics  . Smoking status: Never Smoker   . Smokeless tobacco: Never Used  . Alcohol Use: No  . Drug Use: No  . Sexual Activity: Yes    Birth Control/ Protection: Injection   Other Topics Concern  . Not on file   Social History Narrative   No current facility-administered medications on file prior to encounter.   Current Outpatient Prescriptions on File Prior to Encounter  Medication Sig Dispense Refill  . HYDROcodone-acetaminophen (NORCO) 5-325 MG per tablet Take 1-2 tablets by mouth every 4 (four) hours as needed for moderate pain or severe pain. (Patient not taking: Reported on 07/27/2014) 20 tablet 0   No Known Allergies  Review of Systems  Constitutional: Negative for fever and chills.  Gastrointestinal: Negative for abdominal pain.  Genitourinary:       Positive for vaginal bleeding. Negative  for vaginal discharge or dyspareunia.   OBJECTIVE Blood pressure 116/69, pulse 91, temperature 98.6 F (37 C), resp. rate 18, height 5\' 1"  (1.549 m), weight 141 lb (63.957 kg). GENERAL: Well-developed, well-nourished female in no acute distress.  HEART: normal rate RESP: normal effort GI: Abdomen soft, non-tender. Positive bowel sounds 4. MS: Nontender, no edema NEURO: Alert and oriented SPECULUM EXAM: NEFG, normal amount of dark red blood noted, cervix friable. No lesions. BIMANUAL: cervix closed; uterus normal size, no adnexal tenderness or masses. No cervical motion tenderness.  LAB RESULTS Results for orders placed or performed during the hospital encounter of 07/27/14 (from the past 24 hour(s))  Urinalysis, Routine w reflex microscopic     Status: Abnormal   Collection Time: 07/27/14  7:33 PM  Result Value Ref Range   Color, Urine YELLOW YELLOW   APPearance CLEAR CLEAR   Specific Gravity, Urine <1.005 (L) 1.005 - 1.030   pH 5.5 5.0 - 8.0   Glucose, UA NEGATIVE NEGATIVE mg/dL   Hgb urine dipstick SMALL (A) NEGATIVE   Bilirubin Urine NEGATIVE NEGATIVE   Ketones, ur NEGATIVE NEGATIVE mg/dL   Protein, ur NEGATIVE NEGATIVE mg/dL   Urobilinogen, UA 0.2 0.0 - 1.0 mg/dL   Nitrite NEGATIVE NEGATIVE   Leukocytes, UA NEGATIVE NEGATIVE  Urine microscopic-add on     Status: None   Collection Time: 07/27/14  7:33 PM  Result Value Ref Range   Squamous Epithelial / LPF RARE RARE   WBC, UA 0-2 <3 WBC/hpf   RBC / HPF 0-2 <3 RBC/hpf   Bacteria,  UA RARE RARE  Pregnancy, urine POC     Status: None   Collection Time: 07/27/14  7:42 PM  Result Value Ref Range   Preg Test, Ur NEGATIVE NEGATIVE  CBC     Status: None   Collection Time: 07/27/14  8:20 PM  Result Value Ref Range   WBC 7.4 4.0 - 10.5 K/uL   RBC 4.59 3.87 - 5.11 MIL/uL   Hemoglobin 12.3 12.0 - 15.0 g/dL   HCT 19.1 47.8 - 29.5 %   MCV 81.3 78.0 - 100.0 fL   MCH 26.8 26.0 - 34.0 pg   MCHC 33.0 30.0 - 36.0 g/dL   RDW 62.1  30.8 - 65.7 %   Platelets 238 150 - 400 K/uL  Wet prep, genital     Status: Abnormal   Collection Time: 07/27/14  8:25 PM  Result Value Ref Range   Yeast Wet Prep HPF POC FEW (A) NONE SEEN   Trich, Wet Prep FEW (A) NONE SEEN   Clue Cells Wet Prep HPF POC NONE SEEN NONE SEEN   WBC, Wet Prep HPF POC MODERATE (A) NONE SEEN    IMAGING No results found.  MAU COURSE GC/chlamydia culture, wet prep, HIV testing.  Azithromycin given for chlamydia exposure. Wet prep positive for trichomoniasis. Flagyl given.  ASSESSMENT 1. Exposure to chlamydia   2. Abdominal pain in female   3. Trichomonal vaginitis     PLAN Discharge home in stable condition. Lengthy conversation with patient regarding safe sex sex practices. Strongly recommend condoms. No sex for one week after treatment. Partner needs to be informed about Trichomonas exposure and treated. GC/Chlamydia cultures pending.     Follow-up Information    Follow up with Kathreen Cosier, MD.   Specialty:  Obstetrics and Gynecology   Why:  in the next 2-3 weeks for Depo provera   Contact information:   [address] [place] [address] [phone]       Follow up with [facility].   Why:  As needed in emergencies   Contact information:   [address] [id number] mc [place] [address] [phone]       Medication List    STOP taking these medications        HYDROcodone-acetaminophen 5-325 MG per tablet  Commonly known as:  NORCO      TAKE these medications        omeprazole 20 MG capsule  Commonly known as:  PRILOSEC  Take 20 mg by mouth daily.         [place], [place] 07/27/2014  9:50 PM

## 2014-07-27 NOTE — Discharge Instructions (Signed)
Chlamydia Chlamydia is an infection. It is spread through sexual contact. Chlamydia can be in different areas of the body. These areas include the cervix, urethra, throat, or rectum. You may not know you have chlamydia because many people never develop the symptoms. Chlamydia is not difficult to treat once you know you have it. However, if it is left untreated, chlamydia can lead to more serious health problems.  CAUSES  Chlamydia is caused by bacteria. It is a sexually transmitted disease. It is passed from an infected partner during intimate contact. This contact could be with the genitals, mouth, or rectal area. Chlamydia can also be passed from mothers to babies during birth. SIGNS AND SYMPTOMS  There may not be any symptoms. This is often the case early in the infection. If symptoms develop, they may include:  Mild pain and discomfort when urinating.  Redness, soreness, and swelling (inflammation) of the rectum.  Vaginal discharge.  Painful intercourse.  Abdominal pain.  Bleeding between menstrual periods. DIAGNOSIS  To diagnose this infection, your health care provider will do a pelvic exam. Cultures will be taken of the vagina, cervix, urine, and possibly the rectum to verify the diagnosis.  TREATMENT You will be given antibiotic medicines. If you are pregnant, certain types of antibiotics will need to be avoided. Any sexual partners should also be treated, even if they do not show symptoms.  HOME CARE INSTRUCTIONS   Take your antibiotic medicine as directed by your health care provider. Finish the antibiotic even if you start to feel better.  Take medicines only as directed by your health care provider.  Inform any sexual partners about the infection. They should also be treated.  Do not have sexual contact until your health care provider tells you it is okay.  Get plenty of rest.  Eat a well-balanced diet.  Drink enough fluids to keep your urine clear or pale  yellow.  Keep all follow-up visits as directed by your health care provider. SEEK MEDICAL CARE IF:  You have painful urination.  You have abdominal pain.  You have vaginal discharge.  You have painful sexual intercourse.  You have bleeding between periods and after sex.  You have a fever. SEEK IMMEDIATE MEDICAL CARE IF:   You experience nausea or vomiting.  You experience excessive sweating (diaphoresis).  You have difficulty swallowing. MAKE SURE YOU:   Understand these instructions.  Will watch your condition.  Will get help right away if you are not doing well or get worse. Document Released: 12/30/2004 Document Revised: 08/06/2013 Document Reviewed: 11/27/2012 [facility] Patient Information 2015 [place], [place]. This information is not intended to replace advice given to you by your health care provider. Make sure you discuss any questions you have with your health care provider.  Trichomoniasis Trichomoniasis is an infection caused by an organism called Trichomonas. The infection can affect both women and men. In women, the outer female genitalia and the vagina are affected. In men, the penis is mainly affected, but the prostate and other reproductive organs can also be involved. Trichomoniasis is a sexually transmitted infection (STI) and is most often passed to another person through sexual contact.  RISK FACTORS  Having unprotected sexual intercourse.  Having sexual intercourse with an infected partner. SIGNS AND SYMPTOMS  Symptoms of trichomoniasis in women include:  Abnormal gray-green frothy vaginal discharge.  Itching and irritation of the vagina.  Itching and irritation of the area outside the vagina. Symptoms of trichomoniasis in men include:   Penile discharge with  or without pain.  Pain during urination. This results from inflammation of the urethra. DIAGNOSIS  Trichomoniasis may be found during a Pap test or physical exam. Your health care provider  may use one of the following methods to help diagnose this infection:  Examining vaginal discharge under a microscope. For men, urethral discharge would be examined.  Testing the pH of the vagina with a test tape.  Using a vaginal swab test that checks for the Trichomonas organism. A test is available that provides results within a few minutes.  Doing a culture test for the organism. This is not usually needed. TREATMENT   You may be given medicine to fight the infection. Women should inform their health care provider if they could be or are pregnant. Some medicines used to treat the infection should not be taken during pregnancy.  Your health care provider may recommend over-the-counter medicines or creams to decrease itching or irritation.  Your sexual partner will need to be treated if infected. HOME CARE INSTRUCTIONS   Take medicines only as directed by your health care provider.  Take over-the-counter medicine for itching or irritation as directed by your health care provider.  Do not have sexual intercourse while you have the infection.  Women should not douche or wear tampons while they have the infection.  Discuss your infection with your partner. Your partner may have gotten the infection from you, or you may have gotten it from your partner.  Have your sex partner get examined and treated if necessary.  Practice safe, informed, and protected sex.  See your health care provider for other STI testing. SEEK MEDICAL CARE IF:   You still have symptoms after you finish your medicine.  You develop abdominal pain.  You have pain when you urinate.  You have bleeding after sexual intercourse.  You develop a rash.  Your medicine makes you sick or makes you throw up (vomit). MAKE SURE YOU:  Understand these instructions.  Will watch your condition.  Will get help right away if you are not doing well or get worse. Document Released: 09/15/2000 Document Revised:  08/06/2013 Document Reviewed: 01/01/2013 [facility]bExitCare Patient Information 2015 [place], [place]. This information is not intended to replace advice given to you by your health care provider. Make sure you discuss any questions you have with your health care provider.

## 2014-07-27 NOTE — MAU Note (Signed)
Boyfriend just got incarcerated and was told had chlamydia. Pt having lower abd pain off and on for couple wks. Occ bloody d/c. On depo. Last shot around Valentine's day.

## 2014-07-28 LAB — HIV ANTIBODY (ROUTINE TESTING W REFLEX): HIV SCREEN 4TH GENERATION: NONREACTIVE

## 2014-07-29 LAB — GC/CHLAMYDIA PROBE AMP (~~LOC~~) NOT AT ARMC
CHLAMYDIA, DNA PROBE: POSITIVE — AB
NEISSERIA GONORRHEA: NEGATIVE

## 2014-09-17 ENCOUNTER — Encounter (HOSPITAL_COMMUNITY): Payer: Self-pay | Admitting: *Deleted

## 2014-09-17 ENCOUNTER — Inpatient Hospital Stay (HOSPITAL_COMMUNITY)
Admission: AD | Admit: 2014-09-17 | Discharge: 2014-09-17 | Disposition: A | Discharge status: Home / Self Care | Payer: Medicaid Other | Source: Ambulatory Visit | Attending: Obstetrics | Admitting: Obstetrics

## 2014-09-17 DIAGNOSIS — N921 Excessive and frequent menstruation with irregular cycle: Secondary | ICD-10-CM

## 2014-09-17 DIAGNOSIS — R1032 Left lower quadrant pain: Secondary | ICD-10-CM | POA: Diagnosis present

## 2014-09-17 DIAGNOSIS — N939 Abnormal uterine and vaginal bleeding, unspecified: Secondary | ICD-10-CM | POA: Diagnosis not present

## 2014-09-17 DIAGNOSIS — K219 Gastro-esophageal reflux disease without esophagitis: Secondary | ICD-10-CM | POA: Diagnosis not present

## 2014-09-17 LAB — URINALYSIS, ROUTINE W REFLEX MICROSCOPIC
Bilirubin Urine: NEGATIVE
Glucose, UA: NEGATIVE mg/dL
KETONES UR: 15 mg/dL — AB
Leukocytes, UA: NEGATIVE
Nitrite: NEGATIVE
PH: 6 (ref 5.0–8.0)
Protein, ur: NEGATIVE mg/dL
Specific Gravity, Urine: >1.03 — ABNORMAL HIGH (ref 1.005–1.030)
Urobilinogen, UA: 0.2 mg/dL (ref 0.0–1.0)

## 2014-09-17 LAB — URINE MICROSCOPIC-ADD ON

## 2014-09-17 LAB — WET PREP, GENITAL
Clue Cells Wet Prep HPF POC: NONE SEEN
Trich, Wet Prep: NONE SEEN
YEAST WET PREP: NONE SEEN

## 2014-09-17 LAB — POCT PREGNANCY, URINE: Preg Test, Ur: NEGATIVE

## 2014-09-17 MED ORDER — IBUPROFEN 800 MG PO TABS
800.0000 mg | ORAL_TABLET | Freq: Once | ORAL | Status: AC
  Administered 2014-09-17: 800 mg via ORAL
  Filled 2014-09-17: qty 1

## 2014-09-17 NOTE — Discharge Instructions (Signed)

## 2014-09-17 NOTE — MAU Note (Addendum)
PT SAYS SHE GOT  DEPO SHOT ON 6-6- 2016    AT  DR  MARSHALL'S  OFFICE.   VAGB BLEEDING  STARTED   ON 6-8 -SPOTTING..  NO PAD ON IN  TRIAGE -  SEES BLOOD  WHEN  SHE  WIPES.    ABD PAIN  STARTED   ON 6-7.  NO MEDS  FOR  PAIN.   NO HPT .  LAST  SEX-    6-12.     NO  CYCLES  - HAS BEEN GETTING  DEPO  SHOTS  SINCE  2011

## 2014-09-17 NOTE — MAU Note (Signed)
June 6 got depo shot. June 7th started to bleed and lower abdominal cramping. Pt. States she has had this on and off for a week. Dr. Gaynell Face gave the shot. To see him June 20th. Denies nausea or vomiting. No diarrhea.

## 2014-09-17 NOTE — MAU Provider Note (Signed)
History     CSN: [id number]  Arrival date and time: 09/17/14 1858   First Provider Initiated Contact with Patient 09/17/14 2029      No chief complaint on file.  HPI  [name] is a 24 y.o. G1P0 who presents to MAU today with complaint of LLQ abdominal pain off and on since 09/10/14. The patient states that she has been on Depo Provera since 2011. She had her last injection on 09/09/14. She states she has been spotting with wiping since then. She denies N/V/D or constipation, fever, discharge or UTI symptoms.   OB History    Gravida Para Term Preterm AB TAB SAB Ectopic Multiple Living   1         1      Past Medical History  Diagnosis Date  . Acid reflux     Past Surgical History  Procedure Laterality Date  . No past surgeries      Family History  Problem Relation Age of Onset  . Hypertension Other     History  Substance Use Topics  . Smoking status: Never Smoker   . Smokeless tobacco: Never Used  . Alcohol Use: No    Allergies: No Known Allergies  Prescriptions prior to admission  Medication Sig Dispense Refill Last Dose  . medroxyPROGESTERone (DEPO-PROVERA) 150 MG/ML injection Inject 150 mg into the muscle every 3 (three) months.   Past Month at Unknown time  . omeprazole (PRILOSEC) 20 MG capsule Take 20 mg by mouth daily.   Past Month at Unknown time    Review of Systems  Constitutional: Negative for fever and malaise/fatigue.  Gastrointestinal: Positive for abdominal pain. Negative for nausea, vomiting, diarrhea and constipation.  Genitourinary: Negative for dysuria, urgency and frequency.       + vaginal bleeding Neg - vaginal discharge   Physical Exam   Blood pressure 108/68, pulse 73, temperature 98.5 F (36.9 C), temperature source Oral, resp. rate 20, height 5' (1.524 m), weight 141 lb 4 oz (64.071 kg).  Physical Exam  Nursing note and vitals reviewed. Constitutional: She is oriented to person, place, and time. She appears  well-developed and well-nourished. No distress.  HENT:  Head: Normocephalic and atraumatic.  Cardiovascular: Normal rate.   Respiratory: Effort normal.  GI: Soft. She exhibits no distension and no mass. There is no tenderness. There is no rebound and no guarding.  Genitourinary: Uterus is not enlarged and not tender. Cervix exhibits no motion tenderness, no discharge and no friability. Right adnexum displays no mass and no tenderness. Left adnexum displays no mass and no tenderness. There is bleeding (scant light brown spotting) in the vagina. No vaginal discharge found.  Neurological: She is alert and oriented to person, place, and time.  Skin: Skin is warm and dry. No erythema.  Psychiatric: She has a normal mood and affect.   Results for orders placed or performed during the hospital encounter of 09/17/14 (from the past 24 hour(s))  Urinalysis, Routine w reflex microscopic (not at [facility])     Status: Abnormal   Collection Time: 09/17/14  7:22 PM  Result Value Ref Range   Color, Urine YELLOW YELLOW   APPearance CLEAR CLEAR   Specific Gravity, Urine >1.030 (H) 1.005 - 1.030   pH 6.0 5.0 - 8.0   Glucose, UA NEGATIVE NEGATIVE mg/dL   Hgb urine dipstick LARGE (A) NEGATIVE   Bilirubin Urine NEGATIVE NEGATIVE   Ketones, ur 15 (A) NEGATIVE mg/dL   Protein, ur NEGATIVE NEGATIVE  mg/dL   Urobilinogen, UA 0.2 0.0 - 1.0 mg/dL   Nitrite NEGATIVE NEGATIVE   Leukocytes, UA NEGATIVE NEGATIVE  Urine microscopic-add on     Status: Abnormal   Collection Time: 09/17/14  7:22 PM  Result Value Ref Range   Squamous Epithelial / LPF FEW (A) RARE   WBC, UA 0-2 <3 WBC/hpf   RBC / HPF 3-6 <3 RBC/hpf   Bacteria, UA FEW (A) RARE  Pregnancy, urine POC     Status: None   Collection Time: 09/17/14  7:37 PM  Result Value Ref Range   Preg Test, Ur NEGATIVE NEGATIVE  Wet prep, genital     Status: Abnormal   Collection Time: 09/17/14  8:36 PM  Result Value Ref Range   Yeast Wet Prep HPF POC NONE SEEN NONE SEEN    Trich, Wet Prep NONE SEEN NONE SEEN   Clue Cells Wet Prep HPF POC NONE SEEN NONE SEEN   WBC, Wet Prep HPF POC FEW (A) NONE SEEN    MAU Course  Procedures None  MDM UPT - negative UA, wet prep, GC/Chlamydia today Patient declines HIV  Assessment and Plan  A: Breakthrough bleeding with Depo Provera  P: Discharge home Ibuprofen PRN for pain Bleeding precautions discussed Patient advised to follow-up with Dr. Gaynell Face as scheduled or sooner if symptoms worsen Patient may return to MAU as needed or if her condition were to change or worsen   Marny Lowenstein, PA-C  09/17/2014, 9:08 PM

## 2014-09-18 LAB — GC/CHLAMYDIA PROBE AMP (~~LOC~~) NOT AT ARMC
Chlamydia: NEGATIVE
Neisseria Gonorrhea: NEGATIVE

## 2014-09-22 ENCOUNTER — Encounter (HOSPITAL_COMMUNITY): Payer: Self-pay | Admitting: *Deleted

## 2014-09-22 ENCOUNTER — Emergency Department (HOSPITAL_COMMUNITY)
Admission: EM | Admit: 2014-09-22 | Discharge: 2014-09-22 | Disposition: A | Discharge status: Home / Self Care | Payer: Medicaid Other | Source: Home / Self Care | Attending: Family Medicine | Admitting: Family Medicine

## 2014-09-22 DIAGNOSIS — H6001 Abscess of right external ear: Secondary | ICD-10-CM | POA: Diagnosis not present

## 2014-09-22 MED ORDER — DOXYCYCLINE HYCLATE 100 MG PO CAPS: 100.0000 mg | ORAL_CAPSULE | Freq: Two times a day (BID) | ORAL | Status: DC

## 2014-09-22 NOTE — Discharge Instructions (Signed)
Warm compress twice a day when you take the antibiotic, take all of medicine, return as needed. °

## 2014-09-22 NOTE — ED Provider Notes (Signed)
CSN: [id number]     Arrival date & time 09/22/14  1533 History   First MD Initiated Contact with Patient 09/22/14 1643     No chief complaint on file.  (Consider location/radiation/quality/duration/timing/severity/associated sxs/prior Treatment) Patient is a 24 y.o. female presenting with rash.  Rash Location:  Head/neck Head/neck rash location:  R ear Quality: painful, redness and swelling   Pain details:    Quality:  Throbbing   Severity:  Moderate   Onset quality:  Sudden   Duration:  2 days Severity:  Mild Onset quality:  Gradual Progression:  Worsening Relieved by:  None tried Worsened by:  Nothing tried Ineffective treatments:  None tried Associated symptoms: no fever     Past Medical History  Diagnosis Date  . Acid reflux    Past Surgical History  Procedure Laterality Date  . No past surgeries     Family History  Problem Relation Age of Onset  . Hypertension Other    History  Substance Use Topics  . Smoking status: Never Smoker   . Smokeless tobacco: Never Used  . Alcohol Use: No   OB History    Gravida Para Term Preterm AB TAB SAB Ectopic Multiple Living   1         1     Review of Systems  Constitutional: Negative.  Negative for fever.  HENT: Positive for ear pain. Negative for facial swelling.   Skin: Positive for rash.  Hematological: Positive for adenopathy.    Allergies  Review of patient's allergies indicates no known allergies.  Home Medications   Prior to Admission medications   Medication Sig Start Date End Date Taking? Authorizing Provider  doxycycline (VIBRAMYCIN) 100 MG capsule Take 1 capsule (100 mg total) by mouth 2 (two) times daily. 09/22/14   Linna Hoff, MD  medroxyPROGESTERone (DEPO-PROVERA) 150 MG/ML injection Inject 150 mg into the muscle every 3 (three) months.    Historical Provider, MD  omeprazole (PRILOSEC) 20 MG capsule Take 20 mg by mouth daily.    Historical Provider, MD   BP 131/89 mmHg  Pulse 80  Temp(Src) 99.8  F (37.7 C) (Oral)  Resp 16  SpO2 99% Physical Exam  Constitutional: She is oriented to person, place, and time. She appears well-developed and well-nourished. She appears distressed.  HENT:  Head: Normocephalic.  Left Ear: External ear normal.  Nose: Nose normal.  Mouth/Throat: Oropharynx is clear and moist. No oropharyngeal exudate.  2cm tender fluctuant abscess behind right pinna.  Eyes: Pupils are equal, round, and reactive to light.  Neck: Normal range of motion. Neck supple.  Lymphadenopathy:    She has cervical adenopathy.  Neurological: She is alert and oriented to person, place, and time. Coordination normal.  Skin: Skin is warm and dry.  Nursing note and vitals reviewed.   ED Course  INCISION AND DRAINAGE Date/Time: 09/22/2014 5:13 PM Performed by: Linna Hoff Authorized by: Bradd Canary D Consent: Verbal consent obtained. Consent given by: patient Type: abscess Body area: head/neck Location details: right external ear Local anesthetic: topical anesthetic Patient sedated: no Scalpel size: 11 Incision type: single straight Complexity: simple Drainage: purulent Drainage amount: moderate Wound treatment: wound left open Patient tolerance: Patient tolerated the procedure well with no immediate complications Comments: Culture obtained.   (including critical care time) Labs Review Labs Reviewed  CULTURE, ROUTINE-ABSCESS    Imaging Review No results found.   MDM   1. Abscess, earlobe, right        Quita Skye  Cassidy Tashiro, MD 09/22/14 1719

## 2014-09-22 NOTE — ED Notes (Signed)
Pt  Has  A  Boil     Behind  r   Ear        That     Is   Swollen  And  Tender  To  The  Touch         She  Reports  It  Has  Been there  For  Several  Days

## 2014-09-26 LAB — CULTURE, ROUTINE-ABSCESS

## 2014-09-26 NOTE — ED Notes (Signed)
Final report shows multiple species. Treatment adequate w Rx provided day of visit

## 2014-11-29 ENCOUNTER — Encounter (HOSPITAL_COMMUNITY): Payer: Self-pay | Admitting: Emergency Medicine

## 2014-11-29 ENCOUNTER — Emergency Department (HOSPITAL_COMMUNITY)
Admission: EM | Admit: 2014-11-29 | Discharge: 2014-11-29 | Disposition: A | Discharge status: Home / Self Care | Payer: Medicaid Other | Source: Home / Self Care | Attending: Family Medicine | Admitting: Family Medicine

## 2014-11-29 DIAGNOSIS — H6001 Abscess of right external ear: Secondary | ICD-10-CM | POA: Diagnosis not present

## 2014-11-29 MED ORDER — DOXYCYCLINE HYCLATE 100 MG PO CAPS: 100.0000 mg | ORAL_CAPSULE | Freq: Two times a day (BID) | ORAL | Status: DC

## 2014-11-29 NOTE — ED Notes (Signed)
Pt left w/o d/c instructions and Rx for Doxycycline LM on number on file for call back Left d/c instructions in the front

## 2014-11-29 NOTE — ED Provider Notes (Signed)
CSN: [id number]     Arrival date & time 11/29/14  1409 History   First MD Initiated Contact with Patient 11/29/14 1559     Chief Complaint  Patient presents with  . Abscess   (Consider location/radiation/quality/duration/timing/severity/associated sxs/prior Treatment) HPI Comments: Patient presents with an "abscess" behind right ear. Painful. Has a history of this in the past and did not finish her antibiotics, this was 2 months ago. This swelling began 2 days ago. No fever or chills. No ear canal pain.   The history is provided by the patient.    Past Medical History  Diagnosis Date  . Acid reflux    Past Surgical History  Procedure Laterality Date  . No past surgeries     Family History  Problem Relation Age of Onset  . Hypertension Other    Social History  Substance Use Topics  . Smoking status: Never Smoker   . Smokeless tobacco: Never Used  . Alcohol Use: No   OB History    Gravida Para Term Preterm AB TAB SAB Ectopic Multiple Living   1         1     Review of Systems  All other systems reviewed and are negative.   Allergies  Review of patient's allergies indicates no known allergies.  Home Medications   Prior to Admission medications   Medication Sig Start Date End Date Taking? Authorizing Provider  doxycycline (VIBRAMYCIN) 100 MG capsule Take 1 capsule (100 mg total) by mouth 2 (two) times daily. 11/29/14   Riki Sheer, PA-C  medroxyPROGESTERone (DEPO-PROVERA) 150 MG/ML injection Inject 150 mg into the muscle every 3 (three) months.    Historical Provider, MD  omeprazole (PRILOSEC) 20 MG capsule Take 20 mg by mouth daily.    Historical Provider, MD   Meds Ordered and Administered this Visit  Medications - No data to display  BP 114/73 mmHg  Pulse 81  Temp(Src) 97.7 F (36.5 C) (Oral)  Resp 16  SpO2 100% No data found.   Physical Exam  Constitutional: She is oriented to person, place, and time. She appears well-developed and well-nourished.  No distress.  HENT:  Head: Normocephalic and atraumatic.  Right Ear: External ear normal.  Left Ear: External ear normal.  Neck: Normal range of motion. Neck supple.  Lymphadenopathy:    She has no cervical adenopathy.  Neurological: She is alert and oriented to person, place, and time.  Skin: Skin is warm and dry. She is not diaphoretic.  Small abscess located just inferior to pinna and posterior. Fluctuant, warm. No erythema. Tender to palpation  Psychiatric: Her behavior is normal.  Nursing note and vitals reviewed.   ED Course  INCISION AND DRAINAGE Date/Time: 11/29/2014 4:34 PM Performed by: Riki Sheer Authorized by: Bradd Canary D Consent: Verbal consent obtained. Written consent obtained. Consent given by: patient Patient understanding: patient states understanding of the procedure being performed Patient consent: the patient's understanding of the procedure matches consent given Procedure consent: procedure consent matches procedure scheduled Patient identity confirmed: verbally with patient Type: abscess Body area: head Local anesthetic: topical anesthetic and lidocaine spray Patient sedated: no Scalpel size: 11 Incision type: single straight Incision depth: subcutaneous Drainage: purulent Drainage amount: moderate Wound treatment: wound left open Packing material: none Patient tolerance: Patient tolerated the procedure well with no immediate complications Comments: Did not require packing.    (including critical care time)  Labs Review Labs Reviewed - No data to display  Imaging Review No results  found.   Visual Acuity Review  Right Eye Distance:   Left Eye Distance:   Bilateral Distance:    Right Eye Near:   Left Eye Near:    Bilateral Near:         MDM   1. Abscess, earlobe, right     I&D of retro ear abscess. No complications. Cultured and treated with Doxycycline. Wound care discussed. F/U if needed.    Riki Sheer,  PA-C 11/29/14 1635

## 2014-11-29 NOTE — Discharge Instructions (Signed)

## 2014-11-29 NOTE — ED Notes (Signed)
C/o abscess behind right ear onset 2 days Hx of abscess in the same place Getting worse and more painful Denies fevers, chills, drainage Alert... No acute distress

## 2014-12-02 LAB — WOUND CULTURE: CULTURE: NO GROWTH

## 2014-12-02 NOTE — ED Notes (Signed)
Final report of wound culture negative  

## 2015-01-09 ENCOUNTER — Emergency Department (HOSPITAL_COMMUNITY)
Admission: EM | Admit: 2015-01-09 | Discharge: 2015-01-09 | Disposition: A | Discharge status: Home / Self Care | Payer: Medicaid Other | Attending: Physician Assistant | Admitting: Physician Assistant

## 2015-01-09 ENCOUNTER — Encounter (HOSPITAL_COMMUNITY): Payer: Self-pay | Admitting: Emergency Medicine

## 2015-01-09 DIAGNOSIS — Z202 Contact with and (suspected) exposure to infections with a predominantly sexual mode of transmission: Secondary | ICD-10-CM | POA: Insufficient documentation

## 2015-01-09 DIAGNOSIS — Z792 Long term (current) use of antibiotics: Secondary | ICD-10-CM | POA: Insufficient documentation

## 2015-01-09 DIAGNOSIS — Z79899 Other long term (current) drug therapy: Secondary | ICD-10-CM | POA: Insufficient documentation

## 2015-01-09 DIAGNOSIS — K219 Gastro-esophageal reflux disease without esophagitis: Secondary | ICD-10-CM | POA: Insufficient documentation

## 2015-01-09 DIAGNOSIS — Z3202 Encounter for pregnancy test, result negative: Secondary | ICD-10-CM | POA: Insufficient documentation

## 2015-01-09 DIAGNOSIS — Z793 Long term (current) use of hormonal contraceptives: Secondary | ICD-10-CM | POA: Insufficient documentation

## 2015-01-09 DIAGNOSIS — Z711 Person with feared health complaint in whom no diagnosis is made: Secondary | ICD-10-CM

## 2015-01-09 LAB — URINE MICROSCOPIC-ADD ON

## 2015-01-09 LAB — WET PREP, GENITAL
Clue Cells Wet Prep HPF POC: NONE SEEN
Trich, Wet Prep: NONE SEEN
YEAST WET PREP: NONE SEEN

## 2015-01-09 LAB — URINALYSIS, ROUTINE W REFLEX MICROSCOPIC
Bilirubin Urine: NEGATIVE
GLUCOSE, UA: NEGATIVE mg/dL
Ketones, ur: NEGATIVE mg/dL
Leukocytes, UA: NEGATIVE
NITRITE: NEGATIVE
PH: 6 (ref 5.0–8.0)
Protein, ur: NEGATIVE mg/dL
SPECIFIC GRAVITY, URINE: 1.029 (ref 1.005–1.030)
Urobilinogen, UA: 1 mg/dL (ref 0.0–1.0)

## 2015-01-09 LAB — PREGNANCY, URINE: Preg Test, Ur: NEGATIVE

## 2015-01-09 NOTE — ED Notes (Addendum)
Pt sts here with boyfriend and wants STD check; pt denies vaginal discharge or other sx; pt admits to unprotected intercourse

## 2015-01-09 NOTE — ED Provider Notes (Signed)
CSN: [id number]     Arrival date & time 01/09/15  1113 History   First MD Initiated Contact with Patient 01/09/15 1128     Chief Complaint  Patient presents with  . Exposure to STD     (Consider location/radiation/quality/duration/timing/severity/associated sxs/prior Treatment) HPI   Patient is a pleasant 24 year old female here with her partner. Her partner was having some dysuria so he wanted to come get tested. She came to get tested as well. She is having no symptoms. No fevers. No dysuria. No discharge.  She is requesting a vaginal exam to make sure she looks healthy today.  Past Medical History  Diagnosis Date  . Acid reflux    Past Surgical History  Procedure Laterality Date  . No past surgeries     Family History  Problem Relation Age of Onset  . Hypertension Other    Social History  Substance Use Topics  . Smoking status: Never Smoker   . Smokeless tobacco: Never Used  . Alcohol Use: No   OB History    Gravida Para Term Preterm AB TAB SAB Ectopic Multiple Living   1         1     Review of Systems  Constitutional: Negative for activity change and fatigue.  HENT: Negative for congestion and drooling.   Eyes: Negative for discharge.  Respiratory: Negative for cough and chest tightness.   Cardiovascular: Negative for chest pain.  Gastrointestinal: Negative for abdominal distention.  Genitourinary: Negative for dysuria, urgency, frequency, hematuria, enuresis, difficulty urinating, genital sores, menstrual problem, pelvic pain and dyspareunia.  Musculoskeletal: Negative for joint swelling.  Skin: Negative for rash.  Allergic/Immunologic: Negative for immunocompromised state.  Neurological: Negative for seizures and speech difficulty.  Psychiatric/Behavioral: Negative for behavioral problems and agitation.      Allergies  Review of patient's allergies indicates no known allergies.  Home Medications   Prior to Admission medications   Medication Sig  Start Date End Date Taking? Authorizing Provider  doxycycline (VIBRAMYCIN) 100 MG capsule Take 1 capsule (100 mg total) by mouth 2 (two) times daily. 11/29/14   Riki Sheer, PA-C  medroxyPROGESTERone (DEPO-PROVERA) 150 MG/ML injection Inject 150 mg into the muscle every 3 (three) months.    Historical Provider, MD  omeprazole (PRILOSEC) 20 MG capsule Take 20 mg by mouth daily.    Historical Provider, MD   BP 111/66 mmHg  Pulse 108  Temp(Src) 98.4 F (36.9 C) (Oral)  Resp 20  Ht 5' (1.524 m)  Wt 148 lb (67.132 kg)  BMI 28.90 kg/m2  SpO2 99% Physical Exam  Constitutional: She is oriented to person, place, and time. She appears well-developed and well-nourished.  HENT:  Head: Normocephalic and atraumatic.  Eyes: Conjunctivae are normal. Right eye exhibits no discharge.  Neck: Neck supple.  Cardiovascular: Normal rate, regular rhythm and normal heart sounds.   No murmur heard. Pulmonary/Chest: Effort normal and breath sounds normal. She has no wheezes. She has no rales.  Abdominal: Soft. She exhibits no distension. There is no tenderness.  Genitourinary: Vagina normal.  No CMT.  Musculoskeletal: Normal range of motion. She exhibits no edema.  Neurological: She is oriented to person, place, and time. No cranial nerve deficit.  Skin: Skin is warm and dry. No rash noted. She is not diaphoretic.  Psychiatric: She has a normal mood and affect. Her behavior is normal.  Nursing note and vitals reviewed.   ED Course  Procedures (including critical care time) Labs Review Labs Reviewed  WET  PREP, GENITAL  HIV ANTIBODY (ROUTINE TESTING)  RPR  GC/CHLAMYDIA PROBE AMP ([place]) NOT AT [facility]    Imaging Review No results found. I have personally reviewed and evaluated these images and lab results as part of my medical decision-making.   EKG Interpretation None      MDM   Final diagnoses:  None    Patient is a pleasant 24 year old female presenting for STD check. Patient  would like to get a physical exam and check today. Patient's had no symptoms herself. We'll not treat empirically.  Vaginal exam appears within normal limits. We will send RPR HIV and GC chlamydia. Urine pregnancy is pending.    Sara Randall An, MD 01/09/15 1142

## 2015-01-10 LAB — GC/CHLAMYDIA PROBE AMP (~~LOC~~) NOT AT ARMC
Chlamydia: NEGATIVE
Neisseria Gonorrhea: NEGATIVE

## 2015-01-10 LAB — HIV ANTIBODY (ROUTINE TESTING W REFLEX): HIV SCREEN 4TH GENERATION: NONREACTIVE

## 2015-01-10 LAB — RPR: RPR: NONREACTIVE

## 2015-01-11 LAB — URINE CULTURE

## 2015-02-24 ENCOUNTER — Inpatient Hospital Stay (HOSPITAL_COMMUNITY)
Admission: AD | Admit: 2015-02-24 | Discharge: 2015-02-24 | Disposition: A | Discharge status: Home / Self Care | Payer: Self-pay | Source: Ambulatory Visit | Attending: Family Medicine | Admitting: Family Medicine

## 2015-02-24 ENCOUNTER — Encounter (HOSPITAL_COMMUNITY): Payer: Self-pay | Admitting: *Deleted

## 2015-02-24 DIAGNOSIS — B3731 Acute candidiasis of vulva and vagina: Secondary | ICD-10-CM

## 2015-02-24 DIAGNOSIS — B373 Candidiasis of vulva and vagina: Secondary | ICD-10-CM | POA: Insufficient documentation

## 2015-02-24 LAB — WET PREP, GENITAL
Clue Cells Wet Prep HPF POC: NONE SEEN
SPERM: NONE SEEN
TRICH WET PREP: NONE SEEN

## 2015-02-24 LAB — URINALYSIS, ROUTINE W REFLEX MICROSCOPIC
Bilirubin Urine: NEGATIVE
GLUCOSE, UA: NEGATIVE mg/dL
Ketones, ur: 15 mg/dL — AB
Leukocytes, UA: NEGATIVE
NITRITE: NEGATIVE
PH: 6 (ref 5.0–8.0)
PROTEIN: NEGATIVE mg/dL

## 2015-02-24 LAB — URINE MICROSCOPIC-ADD ON: WBC UA: NONE SEEN WBC/hpf (ref 0–5)

## 2015-02-24 LAB — POCT PREGNANCY, URINE: PREG TEST UR: NEGATIVE

## 2015-02-24 MED ORDER — FLUCONAZOLE 150 MG PO TABS: 150.0000 mg | ORAL_TABLET | Freq: Once | ORAL | Status: DC

## 2015-02-24 MED ORDER — FLUCONAZOLE 150 MG PO TABS
150.0000 mg | ORAL_TABLET | Freq: Once | ORAL | Status: AC
  Administered 2015-02-24: 150 mg via ORAL
  Filled 2015-02-24: qty 1

## 2015-02-24 NOTE — MAU Provider Note (Signed)
History     CSN: [id number]  Arrival date and time: 02/24/15 [id number]   First Provider Initiated Contact with Patient 02/24/15 2132      Chief Complaint  Patient presents with  . Vaginal Discharge   Vaginal Discharge The patient's primary symptoms include vaginal discharge. This is a new problem. The current episode started yesterday. The problem occurs constantly. The problem has been unchanged. The pain is mild. The problem affects both sides. She is not pregnant. Pertinent negatives include no abdominal pain, chills, constipation, diarrhea, dysuria, fever, frequency, nausea, urgency or vomiting. The vaginal discharge was white and thick. There has been no bleeding (LMP: on depo, no periods ). She uses progestin injections for contraception.    Past Medical History  Diagnosis Date  . Acid reflux     Past Surgical History  Procedure Laterality Date  . No past surgeries      Family History  Problem Relation Age of Onset  . Hypertension Other     Social History  Substance Use Topics  . Smoking status: Never Smoker   . Smokeless tobacco: Never Used  . Alcohol Use: No    Allergies: No Known Allergies  Prescriptions prior to admission  Medication Sig Dispense Refill Last Dose  . doxycycline (VIBRAMYCIN) 100 MG capsule Take 1 capsule (100 mg total) by mouth 2 (two) times daily. (Patient not taking: Reported on 02/24/2015) 20 capsule 0   . medroxyPROGESTERone (DEPO-PROVERA) 150 MG/ML injection Inject 150 mg into the muscle every 3 (three) months.   Unknown at Unknown time    Review of Systems  Constitutional: Negative for fever and chills.  Gastrointestinal: Negative for nausea, vomiting, abdominal pain, diarrhea and constipation.  Genitourinary: Positive for vaginal discharge. Negative for dysuria, urgency and frequency.   Physical Exam   Blood pressure 112/77, pulse 84, temperature 99 F (37.2 C), temperature source Oral, resp. rate 16, height 5' (1.524 m), weight  69.4 kg (153 lb), SpO2 100 %.  Physical Exam  Nursing note and vitals reviewed. Constitutional: She is oriented to person, place, and time. She appears well-developed and well-nourished. No distress.  HENT:  Head: Normocephalic.  Eyes: Pupils are equal, round, and reactive to light.  Cardiovascular: Normal rate.   Respiratory: Effort normal.  GI: Soft.  Musculoskeletal: Normal range of motion.  Neurological: She is alert and oriented to person, place, and time.  Skin: Skin is warm and dry.  Psychiatric: She has a normal mood and affect.   Results for orders placed or performed during the hospital encounter of 02/24/15 (from the past 24 hour(s))  Wet prep, genital     Status: Abnormal   Collection Time: 02/24/15  8:53 PM  Result Value Ref Range   Yeast Wet Prep HPF POC PRESENT (A) NONE SEEN   Trich, Wet Prep NONE SEEN NONE SEEN   Clue Cells Wet Prep HPF POC NONE SEEN NONE SEEN   WBC, Wet Prep HPF POC MANY (A) NONE SEEN   Sperm NONE SEEN   Urinalysis, Routine w reflex microscopic ([facility])     Status: Abnormal   Collection Time: 02/24/15  9:00 PM  Result Value Ref Range   Color, Urine YELLOW YELLOW   APPearance CLEAR CLEAR   Specific Gravity, Urine >1.030 (H) 1.005 - 1.030   pH 6.0 5.0 - 8.0   Glucose, UA NEGATIVE NEGATIVE mg/dL   Hgb urine dipstick MODERATE (A) NEGATIVE   Bilirubin Urine NEGATIVE NEGATIVE   Ketones, ur 15 (A) NEGATIVE mg/dL  Protein, ur NEGATIVE NEGATIVE mg/dL   Nitrite NEGATIVE NEGATIVE   Leukocytes, UA NEGATIVE NEGATIVE  Urine microscopic-add on     Status: Abnormal   Collection Time: 02/24/15  9:00 PM  Result Value Ref Range   Squamous Epithelial / LPF 0-5 (A) NONE SEEN   WBC, UA NONE SEEN 0 - 5 WBC/hpf   RBC / HPF 0-5 0 - 5 RBC/hpf   Bacteria, UA RARE (A) NONE SEEN  Pregnancy, urine POC     Status: None   Collection Time: 02/24/15  9:21 PM  Result Value Ref Range   Preg Test, Ur NEGATIVE NEGATIVE    MAU Course  Procedures  MDM Patient  given 150 mg diflucan here.    Assessment and Plan   1. Yeast infection involving the vagina and surrounding area    DC home Comfort measures reviewed  RX: diflucan #1 0RF    Follow-up Information    Follow up with [facility]   Contact information:   [address] [place] [address] [phone]         Tawnya Crook 02/24/2015, 9:34 PM

## 2015-02-24 NOTE — Discharge Instructions (Signed)

## 2015-02-24 NOTE — MAU Note (Signed)
Pt reports heavy vaginal discharge, discharge is thick and pt reports vaginal itching.

## 2015-02-26 LAB — GC/CHLAMYDIA PROBE AMP (~~LOC~~) NOT AT ARMC
CHLAMYDIA, DNA PROBE: NEGATIVE
NEISSERIA GONORRHEA: NEGATIVE

## 2015-04-01 ENCOUNTER — Encounter (HOSPITAL_COMMUNITY): Payer: Self-pay | Admitting: *Deleted

## 2015-04-01 ENCOUNTER — Inpatient Hospital Stay (HOSPITAL_COMMUNITY)
Admission: AD | Admit: 2015-04-01 | Discharge: 2015-04-01 | Disposition: A | Discharge status: Home / Self Care | Payer: Self-pay | Source: Ambulatory Visit | Attending: Family Medicine | Admitting: Family Medicine

## 2015-04-01 DIAGNOSIS — A499 Bacterial infection, unspecified: Secondary | ICD-10-CM

## 2015-04-01 DIAGNOSIS — B9689 Other specified bacterial agents as the cause of diseases classified elsewhere: Secondary | ICD-10-CM

## 2015-04-01 DIAGNOSIS — N760 Acute vaginitis: Secondary | ICD-10-CM

## 2015-04-01 DIAGNOSIS — N939 Abnormal uterine and vaginal bleeding, unspecified: Secondary | ICD-10-CM | POA: Insufficient documentation

## 2015-04-01 HISTORY — DX: Unspecified abnormal cytological findings in specimens from vagina: R87.629

## 2015-04-01 LAB — URINE MICROSCOPIC-ADD ON: WBC UA: NONE SEEN WBC/hpf (ref 0–5)

## 2015-04-01 LAB — URINALYSIS, ROUTINE W REFLEX MICROSCOPIC
Bilirubin Urine: NEGATIVE
Glucose, UA: NEGATIVE mg/dL
KETONES UR: NEGATIVE mg/dL
LEUKOCYTES UA: NEGATIVE
NITRITE: NEGATIVE
PROTEIN: NEGATIVE mg/dL
Specific Gravity, Urine: 1.02 (ref 1.005–1.030)
pH: 6.5 (ref 5.0–8.0)

## 2015-04-01 LAB — WET PREP, GENITAL
SPERM: NONE SEEN
Trich, Wet Prep: NONE SEEN
Yeast Wet Prep HPF POC: NONE SEEN

## 2015-04-01 LAB — POCT PREGNANCY, URINE: Preg Test, Ur: NEGATIVE

## 2015-04-01 MED ORDER — METRONIDAZOLE 500 MG PO TABS: 500.0000 mg | ORAL_TABLET | Freq: Two times a day (BID) | ORAL | Status: DC

## 2015-04-01 NOTE — Discharge Instructions (Signed)

## 2015-04-01 NOTE — MAU Provider Note (Signed)
  History     CSN: [id number]  Arrival date and time: 04/01/15 1528   First Provider Initiated Contact with Patient 04/01/15 1745      No chief complaint on file.  HPI Ms Sara Higgins is a 24yo G1P1 who presents for eval of vag spotting and abd cramping that began last week. She was due for a Depo inj the first week of December and didn't receive it; she is not planning to use contraception currently. No fever or N/V/D.   OB History    Gravida Para Term Preterm AB TAB SAB Ectopic Multiple Living   1         1      Past Medical History  Diagnosis Date  . Acid reflux   . Vaginal Pap smear, abnormal     Past Surgical History  Procedure Laterality Date  . No past surgeries      Family History  Problem Relation Age of Onset  . Hypertension Other     Social History  Substance Use Topics  . Smoking status: Never Smoker   . Smokeless tobacco: Never Used  . Alcohol Use: No    Allergies: No Known Allergies  Prescriptions prior to admission  Medication Sig Dispense Refill Last Dose  . fluconazole (DIFLUCAN) 150 MG tablet Take 1 tablet (150 mg total) by mouth once. (Patient not taking: Reported on 04/01/2015) 1 tablet 0     ROS Physical Exam   Blood pressure 95/64, pulse 89, temperature 98.2 F (36.8 C), temperature source Oral, resp. rate 18, height 5\' 1"  (1.549 m), weight 71.396 kg (157 lb 6.4 oz), last menstrual period 03/25/2015.  Physical Exam  Constitutional: She is oriented to person, place, and time. She appears well-developed.  HENT:  Head: Normocephalic.  Neck: Normal range of motion.  Cardiovascular: Normal rate.   Respiratory: Effort normal.  GI: Soft.  Genitourinary:  SE: sm reddish/brown blood at cx, cx NT  Musculoskeletal: Normal range of motion.  Neurological: She is alert and oriented to person, place, and time.  Skin: Skin is warm and dry.  Psychiatric: She has a normal mood and affect. Her behavior is normal. Thought content normal.    Urinalysis    Component Value Date/Time   COLORURINE YELLOW 04/01/2015 1610   APPEARANCEUR CLEAR 04/01/2015 1610   LABSPEC 1.020 04/01/2015 1610   PHURINE 6.5 04/01/2015 1610   GLUCOSEU NEGATIVE 04/01/2015 1610   HGBUR SMALL* 04/01/2015 1610   BILIRUBINUR NEGATIVE 04/01/2015 1610   KETONESUR NEGATIVE 04/01/2015 1610   PROTEINUR NEGATIVE 04/01/2015 1610   UROBILINOGEN 1.0 01/09/2015 1210   NITRITE NEGATIVE 04/01/2015 1610   LEUKOCYTESUR NEGATIVE 04/01/2015 1610   Micro: 0-5 SE, rare bacteria  Wet prep: clue cells present, few WBC, mod bacteria  UPT: neg  MAU Course  Procedures  MDM Wet prep GC/chlam UA UPT  Assessment and Plan  Withdrawal bleed from Depo BV  D/C home with reassurance Rx Flagyl BID x 7d Motrin prn cramping GC/chlam pending Rec starting another contraceptive method ASAP if not desiring pregnancy  Cam HaiSHAW, KIMBERLY CNM 04/01/2015, 5:49 PM

## 2015-04-01 NOTE — MAU Note (Signed)
Been having an odor, about a week. Been on depo. Wasn't able to get her shot, was due beginning of Dec. Usually doesn't have periods with depo.  Started bleeding about a wk ago, still bleeding a little

## 2015-04-05 LAB — GC/CHLAMYDIA PROBE AMP (~~LOC~~) NOT AT ARMC
CHLAMYDIA, DNA PROBE: NEGATIVE
NEISSERIA GONORRHEA: NEGATIVE

## 2015-06-20 ENCOUNTER — Inpatient Hospital Stay (HOSPITAL_COMMUNITY)
Admission: AD | Admit: 2015-06-20 | Discharge: 2015-06-20 | Disposition: A | Discharge status: Home / Self Care | Payer: Medicaid Other | Source: Ambulatory Visit | Attending: Obstetrics & Gynecology | Admitting: Obstetrics & Gynecology

## 2015-06-20 ENCOUNTER — Encounter (HOSPITAL_COMMUNITY): Payer: Self-pay | Admitting: *Deleted

## 2015-06-20 DIAGNOSIS — Z113 Encounter for screening for infections with a predominantly sexual mode of transmission: Secondary | ICD-10-CM | POA: Insufficient documentation

## 2015-06-20 DIAGNOSIS — K219 Gastro-esophageal reflux disease without esophagitis: Secondary | ICD-10-CM | POA: Insufficient documentation

## 2015-06-20 DIAGNOSIS — B373 Candidiasis of vulva and vagina: Secondary | ICD-10-CM | POA: Insufficient documentation

## 2015-06-20 DIAGNOSIS — B3731 Acute candidiasis of vulva and vagina: Secondary | ICD-10-CM

## 2015-06-20 DIAGNOSIS — Z3202 Encounter for pregnancy test, result negative: Secondary | ICD-10-CM | POA: Insufficient documentation

## 2015-06-20 HISTORY — DX: Personal history of other infectious and parasitic diseases: Z86.19

## 2015-06-20 LAB — URINALYSIS, ROUTINE W REFLEX MICROSCOPIC
Bilirubin Urine: NEGATIVE
GLUCOSE, UA: NEGATIVE mg/dL
Ketones, ur: NEGATIVE mg/dL
Nitrite: NEGATIVE
PROTEIN: NEGATIVE mg/dL
Specific Gravity, Urine: 1.025 (ref 1.005–1.030)
pH: 6 (ref 5.0–8.0)

## 2015-06-20 LAB — WET PREP, GENITAL
CLUE CELLS WET PREP: NONE SEEN
Sperm: NONE SEEN
Trich, Wet Prep: NONE SEEN
Yeast Wet Prep HPF POC: NONE SEEN

## 2015-06-20 LAB — CBC
HCT: 40.3 % (ref 36.0–46.0)
Hemoglobin: 13 g/dL (ref 12.0–15.0)
MCH: 26.5 pg (ref 26.0–34.0)
MCHC: 32.3 g/dL (ref 30.0–36.0)
MCV: 82.1 fL (ref 78.0–100.0)
Platelets: 260 10*3/uL (ref 150–400)
RBC: 4.91 MIL/uL (ref 3.87–5.11)
RDW: 13.6 % (ref 11.5–15.5)
WBC: 6.3 10*3/uL (ref 4.0–10.5)

## 2015-06-20 LAB — URINE MICROSCOPIC-ADD ON

## 2015-06-20 LAB — POCT PREGNANCY, URINE: Preg Test, Ur: NEGATIVE

## 2015-06-20 MED ORDER — FLUCONAZOLE 150 MG PO TABS: 150.0000 mg | ORAL_TABLET | Freq: Every day | ORAL | Status: DC

## 2015-06-20 NOTE — MAU Note (Signed)
Pt states the vaginal discharge and itching started about 2-3 days ago.  Pt states it keeps happening on and off.  She states she will get treated and the symptoms keep coming back.

## 2015-06-20 NOTE — Discharge Instructions (Signed)
Pap smear Clinic - [phone]  Use condoms Take over the counter probiotics with lactobacillus If symptoms don't improve in 3 days, take second Diflucan tablet      Monilial Vaginitis Vaginitis in a soreness, swelling and redness (inflammation) of the vagina and vulva. Monilial vaginitis is not a sexually transmitted infection. CAUSES  Yeast vaginitis is caused by yeast (candida) that is normally found in your vagina. With a yeast infection, the candida has overgrown in number to a point that upsets the chemical balance. SYMPTOMS   White, thick vaginal discharge.  Swelling, itching, redness and irritation of the vagina and possibly the lips of the vagina (vulva).  Burning or painful urination.  Painful intercourse. DIAGNOSIS  Things that may contribute to monilial vaginitis are:  Postmenopausal and virginal states.  Pregnancy.  Infections.  Being tired, sick or stressed, especially if you had monilial vaginitis in the past.  Diabetes. Good control will help lower the chance.  Birth control pills.  Tight fitting garments.  Using bubble bath, feminine sprays, douches or deodorant tampons.  Taking certain medications that kill germs (antibiotics).  Sporadic recurrence can occur if you become ill. TREATMENT  Your caregiver will give you medication.  There are several kinds of anti monilial vaginal creams and suppositories specific for monilial vaginitis. For recurrent yeast infections, use a suppository or cream in the vagina 2 times a week, or as directed.  Anti-monilial or steroid cream for the itching or irritation of the vulva may also be used. Get your caregiver's permission.  Painting the vagina with methylene blue solution may help if the monilial cream does not work.  Eating yogurt may help prevent monilial vaginitis. HOME CARE INSTRUCTIONS   Finish all medication as prescribed.  Do not have sex until treatment is completed or after your caregiver tells  you it is okay.  Take warm sitz baths.  Do not douche.  Do not use tampons, especially scented ones.  Wear cotton underwear.  Avoid tight pants and panty hose.  Tell your sexual partner that you have a yeast infection. They should go to their caregiver if they have symptoms such as mild rash or itching.  Your sexual partner should be treated as well if your infection is difficult to eliminate.  Practice safer sex. Use condoms.  Some vaginal medications cause latex condoms to fail. Vaginal medications that harm condoms are:  Cleocin cream.  Butoconazole (Femstat).  Terconazole (Terazol) vaginal suppository.  Miconazole (Monistat) (may be purchased over the counter). SEEK MEDICAL CARE IF:   You have a temperature by mouth above 102 F (38.9 C).  The infection is getting worse after 2 days of treatment.  The infection is not getting better after 3 days of treatment.  You develop blisters in or around your vagina.  You develop vaginal bleeding, and it is not your menstrual period.  You have pain when you urinate.  You develop intestinal problems.  You have pain with sexual intercourse.   This information is not intended to replace advice given to you by your health care provider. Make sure you discuss any questions you have with your health care provider.   Document Released: 12/30/2004 Document Revised: 06/14/2011 Document Reviewed: 09/23/2014 Elsevier Interactive Patient Education [employer]2016 Elsevier Inc.

## 2015-06-20 NOTE — MAU Provider Note (Signed)
History     CSN: [id number]  Arrival date and time: 06/20/15 4098   First Provider Initiated Contact with Patient 06/20/15 1010      Chief Complaint  Patient presents with  . Vaginal Discharge  . Vaginal Itching   HPI Comments: Sara Higgins is a 25 y.o. Female who presents for vaginal irritation & discharge. States this has been a recurrent problem since being with her current partner for the last year. PMH significant for yeast infection, bacterial vaginosis, trichomoniasis, & chlamydia, all within the last year. Desires STD testing today.  Also states had abnormal Pap smear last June at Dr. Elsie Stain office & was supposed to follow up for repeat pap in 6 months but has since lost her insurance.   Reports no changes in soaps (uses Dove bar soap) or laundry detergent & does not douche.   Female GU Problem The patient's primary symptoms include genital itching, a genital odor, missed menses (recently missed depo shot) and vaginal discharge. The patient's pertinent negatives include no genital lesions, genital rash or vaginal bleeding. This is a recurrent problem. The current episode started in the past 7 days. The problem has been unchanged. The patient is experiencing no pain. She is not pregnant. Pertinent negatives include no abdominal pain, chills, constipation, diarrhea, dysuria, fever, painful intercourse or urgency. The vaginal discharge was thick, white and malodorous. There has been no bleeding. Nothing aggravates the symptoms. She has tried nothing for the symptoms. She is sexually active. It is possible that her partner has an STD. She uses nothing for contraception. Her past medical history is significant for an STD and vaginosis.       Past Medical History  Diagnosis Date  . Acid reflux   . Vaginal Pap smear, abnormal     Past Surgical History  Procedure Laterality Date  . No past surgeries      Family History  Problem Relation Age of Onset  . Hypertension  Other     Social History  Substance Use Topics  . Smoking status: Never Smoker   . Smokeless tobacco: Never Used  . Alcohol Use: No    Allergies: No Known Allergies  Prescriptions prior to admission  Medication Sig Dispense Refill Last Dose  . metroNIDAZOLE (FLAGYL) 500 MG tablet Take 1 tablet (500 mg total) by mouth 2 (two) times daily. 14 tablet 0     Review of Systems  Constitutional: Negative.  Negative for fever and chills.  Gastrointestinal: Negative.  Negative for abdominal pain, diarrhea and constipation.  Genitourinary: Positive for vaginal discharge and missed menses (recently missed depo shot). Negative for dysuria and urgency.       + vaginal discharge & irritation No vaginal bleeding, dyspareunia, or post coital bleeding   Physical Exam   Blood pressure 114/64, pulse 81, temperature 98 F (36.7 C), temperature source Oral, resp. rate 16, last menstrual period 05/08/2015.  Physical Exam  Nursing note and vitals reviewed. Constitutional: She is oriented to person, place, and time. She appears well-developed and well-nourished. No distress.  HENT:  Head: Normocephalic and atraumatic.  Eyes: Conjunctivae are normal. Right eye exhibits no discharge. Left eye exhibits no discharge. No scleral icterus.  Neck: Normal range of motion.  Cardiovascular: Normal rate.   Respiratory: Effort normal. No respiratory distress.  GI: Soft. She exhibits no distension. There is no tenderness.  Genitourinary: Uterus normal. Cervix exhibits no motion tenderness and no friability. Right adnexum displays no mass and no tenderness. Left adnexum displays no  mass and no tenderness. Vaginal discharge (small amount of clumpy white discharge & yellow mucoid discharge) found.  Neurological: She is alert and oriented to person, place, and time.  Skin: Skin is warm and dry. She is not diaphoretic.  Psychiatric: She has a normal mood and affect. Her behavior is normal. Judgment and thought content  normal.    MAU Course  Procedures Results for orders placed or performed during the hospital encounter of 06/20/15 (from the past 24 hour(s))  Urinalysis, Routine w reflex microscopic (not at [facility])     Status: Abnormal   Collection Time: 06/20/15 10:00 AM  Result Value Ref Range   Color, Urine YELLOW YELLOW   APPearance CLEAR CLEAR   Specific Gravity, Urine 1.025 1.005 - 1.030   pH 6.0 5.0 - 8.0   Glucose, UA NEGATIVE NEGATIVE mg/dL   Hgb urine dipstick MODERATE (A) NEGATIVE   Bilirubin Urine NEGATIVE NEGATIVE   Ketones, ur NEGATIVE NEGATIVE mg/dL   Protein, ur NEGATIVE NEGATIVE mg/dL   Nitrite NEGATIVE NEGATIVE   Leukocytes, UA MODERATE (A) NEGATIVE  Urine microscopic-add on     Status: Abnormal   Collection Time: 06/20/15 10:00 AM  Result Value Ref Range   Squamous Epithelial / LPF 0-5 (A) NONE SEEN   WBC, UA 0-5 0 - 5 WBC/hpf   RBC / HPF 6-30 0 - 5 RBC/hpf   Bacteria, UA FEW (A) NONE SEEN   Urine-Other MUCOUS PRESENT   Pregnancy, urine POC     Status: None   Collection Time: 06/20/15 10:02 AM  Result Value Ref Range   Preg Test, Ur NEGATIVE NEGATIVE  CBC     Status: None   Collection Time: 06/20/15 10:49 AM  Result Value Ref Range   WBC 6.3 4.0 - 10.5 K/uL   RBC 4.91 3.87 - 5.11 MIL/uL   Hemoglobin 13.0 12.0 - 15.0 g/dL   HCT 16.140.3 09.636.0 - 04.546.0 %   MCV 82.1 78.0 - 100.0 fL   MCH 26.5 26.0 - 34.0 pg   MCHC 32.3 30.0 - 36.0 g/dL   RDW 40.913.6 81.111.5 - 91.415.5 %   Platelets 260 150 - 400 K/uL  Wet prep, genital     Status: Abnormal   Collection Time: 06/20/15 11:05 AM  Result Value Ref Range   Yeast Wet Prep HPF POC NONE SEEN NONE SEEN   Trich, Wet Prep NONE SEEN NONE SEEN   Clue Cells Wet Prep HPF POC NONE SEEN NONE SEEN   WBC, Wet Prep HPF POC FEW (A) NONE SEEN   Sperm NONE SEEN     MDM UPT negative CBC, HIV, RPR, GC/CT, wet prep Wet prep negative - will treat for yeast based on symptoms & exam.   Assessment and Plan  A: 1. Vaginal yeast infection   2. Screening  for STD (sexually transmitted disease)     P: Discharge home Rx diflucan  GC/CT pending  Judeth Hornrin Kary Sugrue 06/20/2015, 10:34 AM

## 2015-06-20 NOTE — Progress Notes (Signed)
CC: vaginal discharge and itching  HPI: Sara Higgins is a 25 y.o. G1P1001 being seen today for vaginal discharge and itching for the past 2-3 days. The discharge is thick and white. She says it looks similar to when she previously had a yeast infection. She denies dysuria, increased frequency, and bleeding. She has been sexually active with the same partner for the past year and they do not use condoms. She does not have any pain or bleeding with sex. She denies fever, nausea/vomiting, abdominal pain, diarrhea, constipation, and chest pain. She has a history of yeast, BV, chlamydia, gonorrhea, and trichomonas infections and is concerned about resolving her recurrent infections.  Her LMP was 05/08/15 and she has not had a period since then. She was getting Depo-Provera injections up until December and has not used birth control since then.  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  PMH: Acid reflux (occasionally treated with OTC meds)  Past OB hx: One term pregnancy in 2011, no complications  Past Surgical Hx: None  Meds: Occasional OTC meds for acid reflux  Allergies: None  Family Hx: HTN-paternal grandfather  Social Hx: Does not smoke, use alcohol, or use other drugs  Vitals:  06/20/15 1002  BP: 114/64  Pulse: 81  Temp: 98 F (36.7 C)  TempSrc: Oral  Resp: 16   Physical Exam: General:   Alert, oriented and cooperative. Patient is in no acute distress.  Skin:  Skin is warm and dry.  Cardiovascular:  RRR  Respiratory:  CTAB  Abdomen:  Soft, nontender. Normal bowel sounds.       Pelvic:  Creamy white vaginal discharge seen on speculum exam   Mental Status: Normal mood and affect. Normal behavior.    Labs: Urine pregnancy test- negative UA- few bacteria and moderate leukocytes CBC- wnl Wet prep- negative for yeast, trich, and clue cells; few  WBCs; no sperm  Assessment and Plan: Sara PriestLatisha Douty is a 25 y.o. G1P1001 being seen today for vaginal discharge and itching that is likely due to infection. To assess infection, UA, GC/chlamydia amplification, wet prep, RPR, HIV antibody, and CBC were ordered. Pt was given tx for yeast infection based on clinical suspicion although wet prep was negative. A urine pregnancy test was ordered because she missed her last period; the result was negative.  -Tx with Diflucan 150 mg once; advised patient to take second dose in 3 days if symptoms do not resolve -Referred pt to [facility] for free pap smear and followup -Discharge home  Felton ClintonKali Tyjai Matuszak, [place] 06/20/15 10:55am

## 2015-06-21 LAB — HIV ANTIBODY (ROUTINE TESTING W REFLEX): HIV SCREEN 4TH GENERATION: NONREACTIVE

## 2015-06-21 LAB — RPR: RPR Ser Ql: NONREACTIVE

## 2015-06-23 LAB — GC/CHLAMYDIA PROBE AMP (~~LOC~~) NOT AT ARMC
CHLAMYDIA, DNA PROBE: NEGATIVE
Neisseria Gonorrhea: NEGATIVE

## 2015-07-15 ENCOUNTER — Encounter (HOSPITAL_COMMUNITY): Payer: Self-pay | Admitting: *Deleted

## 2015-07-24 ENCOUNTER — Ambulatory Visit (HOSPITAL_COMMUNITY)
Admission: RE | Admit: 2015-07-24 | Discharge: 2015-07-24 | Disposition: A | Discharge status: Home / Self Care | Payer: Self-pay | Source: Ambulatory Visit | Attending: Obstetrics and Gynecology | Admitting: Obstetrics and Gynecology

## 2015-07-24 ENCOUNTER — Encounter (HOSPITAL_COMMUNITY): Payer: Self-pay

## 2015-07-24 VITALS — BP 112/64 | Temp 98.8°F | Ht 60.0 in | Wt 153.2 lb

## 2015-07-24 DIAGNOSIS — Z1239 Encounter for other screening for malignant neoplasm of breast: Secondary | ICD-10-CM

## 2015-07-24 DIAGNOSIS — IMO0002 Reserved for concepts with insufficient information to code with codable children: Secondary | ICD-10-CM

## 2015-07-24 NOTE — Patient Instructions (Signed)
Educational materials on self breast awareness given. Explained to Elizebeth BrookingLatisha D Rake the colposcopy the needed follow up for her abnormal Pap smear on 06/27/2015. [facility]. Appointment scheduled for Monday, Aug 25, 2015 at 1310. Patient aware of appointment and will be there. Let patient know that a screening mammogram is recommended at age 25 unless clinically indicated prior. Daylene PoseyLatisha D Engdahl verbalized understanding.  Chanda Laperle, Kathaleen Maserhristine Poll, RN 5:20 PM

## 2015-07-24 NOTE — Addendum Note (Signed)
Encounter addended by: Priscille Heidelberghristine P Cormac Wint, RN on: 07/24/2015  5:36 PM<BR>     Documentation filed: Notes Section

## 2015-07-24 NOTE — Progress Notes (Addendum)
[facility] due to having an abnormal Pap smear 06/27/2015 that a colposcopy is recommended for follow up.  Pap Smear: Pap smear not completed today. [facility] with positive HPV. [facility] for a [facility]. Appointment scheduled for Monday, Aug 25, 2015 at 1310. Per patient has a history of an abnormal Pap smear around September 2016 that a colposcopy was completed and a LEEP was recommended for follow up. Patient did not follow up and have the LEEP completed. Last Pap smear result is in EPIC.  Physical exam: Breasts Breasts symmetrical. No skin abnormalities bilateral breasts. No nipple retraction bilateral breasts. No nipple discharge bilateral breasts. No lymphadenopathy. No lumps palpated bilateral breasts. No complaints of pain or tenderness on exam. Screening mammogram recommended at age 25 unless clinically indicated prior.  Pelvic/Bimanual No Pap smear completed today since last Pap smear was 06/27/2015. Pap smear not indicated per [facility] guidelines.   Smoking History: Patient has never smoked.  Patient Navigation: Patient education provided. Access to services [facility] through [facility].

## 2015-07-29 ENCOUNTER — Encounter (HOSPITAL_COMMUNITY): Payer: Self-pay | Admitting: *Deleted

## 2015-08-04 ENCOUNTER — Other Ambulatory Visit: Payer: Self-pay | Admitting: Advanced Practice Midwife

## 2015-08-08 ENCOUNTER — Inpatient Hospital Stay (HOSPITAL_COMMUNITY)
Admission: AD | Admit: 2015-08-08 | Discharge: 2015-08-08 | Disposition: A | Discharge status: Home / Self Care | Payer: Medicaid Other | Source: Ambulatory Visit | Attending: Obstetrics & Gynecology | Admitting: Obstetrics & Gynecology

## 2015-08-08 ENCOUNTER — Encounter (HOSPITAL_COMMUNITY): Payer: Self-pay

## 2015-08-08 DIAGNOSIS — N760 Acute vaginitis: Secondary | ICD-10-CM

## 2015-08-08 DIAGNOSIS — B3731 Acute candidiasis of vulva and vagina: Secondary | ICD-10-CM

## 2015-08-08 DIAGNOSIS — B373 Candidiasis of vulva and vagina: Secondary | ICD-10-CM

## 2015-08-08 DIAGNOSIS — B9689 Other specified bacterial agents as the cause of diseases classified elsewhere: Secondary | ICD-10-CM

## 2015-08-08 DIAGNOSIS — K219 Gastro-esophageal reflux disease without esophagitis: Secondary | ICD-10-CM | POA: Insufficient documentation

## 2015-08-08 DIAGNOSIS — A499 Bacterial infection, unspecified: Secondary | ICD-10-CM

## 2015-08-08 LAB — URINALYSIS, ROUTINE W REFLEX MICROSCOPIC
Bilirubin Urine: NEGATIVE
GLUCOSE, UA: NEGATIVE mg/dL
Ketones, ur: NEGATIVE mg/dL
LEUKOCYTES UA: NEGATIVE
Nitrite: NEGATIVE
PROTEIN: NEGATIVE mg/dL
Specific Gravity, Urine: 1.015 (ref 1.005–1.030)
pH: 7 (ref 5.0–8.0)

## 2015-08-08 LAB — URINE MICROSCOPIC-ADD ON: WBC, UA: NONE SEEN WBC/hpf (ref 0–5)

## 2015-08-08 LAB — WET PREP, GENITAL
CLUE CELLS WET PREP: NONE SEEN
Sperm: NONE SEEN
Sperm: NONE SEEN
TRICH WET PREP: NONE SEEN
Trich, Wet Prep: NONE SEEN
WBC WET PREP: NONE SEEN
WBC, Wet Prep HPF POC: NONE SEEN
YEAST WET PREP: NONE SEEN
Yeast Wet Prep HPF POC: NONE SEEN

## 2015-08-08 LAB — POCT PREGNANCY, URINE: PREG TEST UR: NEGATIVE

## 2015-08-08 MED ORDER — FLUCONAZOLE 150 MG PO TABS
150.0000 mg | ORAL_TABLET | Freq: Once | ORAL | Status: AC
  Administered 2015-08-08: 150 mg via ORAL
  Filled 2015-08-08: qty 1

## 2015-08-08 MED ORDER — METRONIDAZOLE 0.75 % VA GEL: 1.0000 | Freq: Two times a day (BID) | VAGINAL | Status: DC

## 2015-08-08 NOTE — Discharge Instructions (Signed)

## 2015-08-08 NOTE — MAU Provider Note (Signed)
Chief Complaint: Vaginal Discharge   First Provider Initiated Contact with Patient 08/08/15 0845     SUBJECTIVE HPI: Sara Higgins is a 25 y.o. G58P1001 female who presents to Maternity Admissions reporting recurrent vaginal odor that is extremely troubling to her. She lost her Medicaid and hasn't been able to go to Dr. Gaynell Higgins since then. Odor is most notable after intercourse she finds it embarrassing. States that she keeps being treated with Flagyl tablet, but it does not work as well as Sara Higgins. Use some left over MetroGel last week and symptoms improved, but symptoms returned again.  Modifying factors: Resolved after MetroGel Associated signs and symptoms: Positive for vaginal irritation. Negative for fever, chills, dyspareunia, vaginal bleeding or abdominal pain.  Past Medical History  Diagnosis Date  . Acid reflux   . Vaginal Pap smear, abnormal   . History of trichomoniasis 07/2014  . History of chlamydia 07/2014   OB History  Gravida Para Term Preterm AB SAB TAB Ectopic Multiple Living  1 1 1       1     # Outcome Date GA Lbr Len/2nd Weight Sex Delivery Anes PTL Lv  1 Term      Vag-Spont        Past Surgical History  Procedure Laterality Date  . No past surgeries     Social History   Social History  . Marital Status: Single    Spouse Name: N/A  . Number of Children: N/A  . Years of Education: N/A   Occupational History  . Not on file.   Social History Main Topics  . Smoking status: Never Smoker   . Smokeless tobacco: Never Used  . Alcohol Use: No  . Drug Use: No  . Sexual Activity: Yes    Birth Control/ Protection: None   Other Topics Concern  . Not on file   Social History Narrative   No current facility-administered medications on file prior to encounter.   Current Outpatient Prescriptions on File Prior to Encounter  Medication Sig Dispense Refill  . fluconazole (DIFLUCAN) 150 MG tablet Take 1 tablet (150 mg total) by mouth daily. (Patient not  taking: Reported on 08/08/2015) 1 tablet 1   No Known Allergies  I have reviewed the past Medical Hx, Surgical Hx, Social Hx, Allergies and Medications.   Review of Systems  Constitutional: Negative for fever and chills.  Gastrointestinal: Negative for abdominal pain.  Genitourinary: Positive for vaginal discharge. Negative for vaginal bleeding and menstrual problem.       Recurrent vaginal odor.   Musculoskeletal: Negative for back pain.    OBJECTIVE Patient Vitals for the past 24 hrs:  BP Temp Pulse Resp Height Weight  08/08/15 0809 117/73 mmHg 98.2 F (36.8 C) 80 16 - -  08/08/15 0803 - - - - 5' (1.524 m) 151 lb 1.3 oz (68.529 kg)   Constitutional: Well-developed, well-nourished female in no acute distress.  Cardiovascular: normal rate Respiratory: normal rate and effort.  GI: Abd soft, non-tender. MS: Extremities nontender, no edema, normal ROM Neurologic: Alert and oriented x 4.  GU: Neg CVAT.  SPECULUM EXAM: NEFG, Small amount of thick, white, curd-like, odorless discharge consistent with yeast, no blood noted, cervix clean  BIMANUAL: cervix closed; uterus normal size, no adnexal tenderness or masses. No CMT.  LAB RESULTS Results for orders placed or performed during the hospital encounter of 08/08/15 (from the past 24 hour(s))  Urinalysis, Routine w reflex microscopic (not at [facility])     Status: Abnormal  Collection Time: 08/08/15  8:05 AM  Result Value Ref Range   Color, Urine YELLOW YELLOW   APPearance CLEAR CLEAR   Specific Gravity, Urine 1.015 1.005 - 1.030   pH 7.0 5.0 - 8.0   Glucose, UA NEGATIVE NEGATIVE mg/dL   Hgb urine dipstick MODERATE (A) NEGATIVE   Bilirubin Urine NEGATIVE NEGATIVE   Ketones, ur NEGATIVE NEGATIVE mg/dL   Protein, ur NEGATIVE NEGATIVE mg/dL   Nitrite NEGATIVE NEGATIVE   Leukocytes, UA NEGATIVE NEGATIVE  Urine microscopic-add on     Status: Abnormal   Collection Time: 08/08/15  8:05 AM  Result Value Ref Range   Squamous Epithelial /  LPF 0-5 (A) NONE SEEN   WBC, UA NONE SEEN 0 - 5 WBC/hpf   RBC / HPF 0-5 0 - 5 RBC/hpf   Bacteria, UA FEW (A) NONE SEEN   Urine-Other MUCOUS PRESENT   Pregnancy, urine POC     Status: None   Collection Time: 08/08/15  8:07 AM  Result Value Ref Range   Preg Test, Ur NEGATIVE NEGATIVE  Wet prep, genital     Status: None   Collection Time: 08/08/15  8:20 AM  Result Value Ref Range   Yeast Wet Prep HPF POC NONE SEEN NONE SEEN   Trich, Wet Prep NONE SEEN NONE SEEN   Clue Cells Wet Prep HPF POC NONE SEEN NONE SEEN   WBC, Wet Prep HPF POC NONE SEEN NONE SEEN   Sperm NONE SEEN   Wet prep, genital     Status: Abnormal   Collection Time: 08/08/15  8:53 AM  Result Value Ref Range   Yeast Wet Prep HPF POC NONE SEEN NONE SEEN   Trich, Wet Prep NONE SEEN NONE SEEN   Clue Cells Wet Prep HPF POC PRESENT (A) NONE SEEN   WBC, Wet Prep HPF POC NONE SEEN NONE SEEN   Sperm NONE SEEN     Blind Collection of wet prep was negative. Wet prep repeated with speculum exam.  IMAGING N/A  MAU COURSE Wet prep, GC/chlamydia cultures, UPT.  Clinical diagnosis of yeast infection. Diflucan given.  MDM - Recurrent bacterial vaginosis. No evidence of PID or other emergent condition. - Diagnosis of yeast infection, treated with Diflucan.  ASSESSMENT 1. BV (bacterial vaginosis)   2. Vaginal yeast infection     PLAN Discharge home in stable condition. Will do extended course of treatment for recurrent bacterial vaginosis. Per patient request wll do full course of MetroGel followed by MetroGel twice a week for 6 months. Pelvic rest 1 week. GC/Chlamydia cultures pending. Recommend Probiotic and RepHresh.  Follow-up Information    Follow up with [facility].   Why:  For non-emergent gynecology care   Contact information:   [address] [place] [address] [phone]       Follow up with [facility] [place] [facility].   Why:  As needed in emergencies    Contact information:   [address] [id number] mc [place] [address] [phone]       Medication List    STOP taking these medications        fluconazole 150 MG tablet  Commonly known as:  DIFLUCAN      TAKE these medications        metroNIDAZOLE 0.75 % vaginal gel  Commonly known as:  METROGEL VAGINAL  Place 1 Applicatorful vaginally 2 (two) times daily. Take twice a day x 5 days, then twice a week for 6  months for recurrent bacterial vaginosis.         LaneVirginia Vinicio Lynk, CNM 08/08/2015  9:41 AM

## 2015-08-11 LAB — GC/CHLAMYDIA PROBE AMP (~~LOC~~) NOT AT ARMC
Chlamydia: NEGATIVE
NEISSERIA GONORRHEA: NEGATIVE

## 2015-08-19 ENCOUNTER — Encounter (HOSPITAL_COMMUNITY): Payer: Self-pay | Admitting: Emergency Medicine

## 2015-08-19 ENCOUNTER — Emergency Department (HOSPITAL_COMMUNITY)
Admission: EM | Admit: 2015-08-19 | Discharge: 2015-08-19 | Disposition: A | Discharge status: Home / Self Care | Payer: Medicaid Other | Attending: Emergency Medicine | Admitting: Emergency Medicine

## 2015-08-19 DIAGNOSIS — Z8619 Personal history of other infectious and parasitic diseases: Secondary | ICD-10-CM | POA: Insufficient documentation

## 2015-08-19 DIAGNOSIS — Z8719 Personal history of other diseases of the digestive system: Secondary | ICD-10-CM | POA: Insufficient documentation

## 2015-08-19 DIAGNOSIS — H6001 Abscess of right external ear: Secondary | ICD-10-CM | POA: Insufficient documentation

## 2015-08-19 MED ORDER — SULFAMETHOXAZOLE-TRIMETHOPRIM 800-160 MG PO TABS: 2.0000 | ORAL_TABLET | Freq: Two times a day (BID) | ORAL | Status: AC

## 2015-08-19 MED ORDER — LIDOCAINE HCL (PF) 1 % IJ SOLN
5.0000 mL | Freq: Once | INTRAMUSCULAR | Status: DC
  Filled 2015-08-19: qty 5

## 2015-08-19 NOTE — ED Provider Notes (Signed)
CSN: [id number]     Arrival date & time 08/19/15  4098 History  By signing my name below, I, Placido Sou, attest that this documentation has been prepared under the direction and in the presence of [employer], PA-C. Electronically Signed: Placido Sou, ED Scribe. 08/19/2015. 10:09 AM.   Chief Complaint  Patient presents with  . Ear Problem   The history is provided by the patient. No language interpreter was used.    HPI Comments: Sara Higgins is a 25 y.o. female who presents to the Emergency Department complaining of a worsening, mild, abscess to her posterior right ear x 4 days. Pt reports a hx of similar symptoms 1 year ago and received an I&D and was d/c with abx stating that her symptoms fully alleviated. Pt denies fever, inner ear pain, n/v, discharge or any other associated symptoms at this time.  Past Medical History  Diagnosis Date  . Acid reflux   . Vaginal Pap smear, abnormal   . History of trichomoniasis 07/2014  . History of chlamydia 07/2014   Past Surgical History  Procedure Laterality Date  . No past surgeries     Family History  Problem Relation Age of Onset  . Hypertension Other   . Diabetes Paternal Grandmother   . Hypertension Paternal Grandmother    Social History  Substance Use Topics  . Smoking status: Never Smoker   . Smokeless tobacco: Never Used  . Alcohol Use: No   OB History    Gravida Para Term Preterm AB TAB SAB Ectopic Multiple Living   Review of Systems  Constitutional: Negative for fever and chills.  HENT: Positive for ear pain. Negative for ear discharge.   Gastrointestinal: Negative for nausea and vomiting.  Skin: Positive for rash.    Allergies  Review of patient's allergies indicates no known allergies.  Home Medications   Prior to Admission medications   Medication Sig Start Date End Date Taking? Authorizing Provider  metroNIDAZOLE (METROGEL VAGINAL) 0.75 % vaginal gel Place 1 Applicatorful  vaginally 2 (two) times daily. Take twice a day x 5 days, then twice a week for 6 months for recurrent bacterial vaginosis. 08/08/15   Virginia Smith, CNM   BP 118/68 mmHg  Pulse 79  Temp(Src) 98.4 F (36.9 C) (Oral)  Resp 18  Ht 5' (1.524 m)  Wt 157 lb (71.215 kg)  BMI 30.66 kg/m2  SpO2 100%  LMP 05/09/2015    Physical Exam  Constitutional: She is oriented to person, place, and time. She appears well-developed and well-nourished.  HENT:  Head: Normocephalic and atraumatic.  Right Ear: Tympanic membrane and ear canal normal.  Left Ear: Tympanic membrane, external ear and ear canal normal.  1 cm abscess of the posterior pinna of right ear; no surrounding erythema, edema, or warmth.  No mastoid tenderness or erythema   Eyes: EOM are normal.  Neck: Normal range of motion.  Cardiovascular: Normal rate, regular rhythm and normal heart sounds.  Exam reveals no gallop and no friction rub.   No murmur heard. Pulmonary/Chest: Effort normal and breath sounds normal. No respiratory distress. She has no wheezes. She has no rales. She exhibits no tenderness.  Abdominal: Soft.  Musculoskeletal: Normal range of motion.  Neurological: She is alert and oriented to person, place, and time.  Skin: Skin is warm and dry.  Psychiatric: She has a normal mood and affect.  Nursing note and vitals reviewed.  ED Course  Procedures  DIAGNOSTIC STUDIES: Oxygen Saturation is 100% on RA, normal by my interpretation.    COORDINATION OF CARE: 9:46 AM Discussed next steps with pt. She verbalized understanding and is agreeable with the plan.   INCISION AND DRAINAGE PROCEDURE NOTE: Patient identification was confirmed and verbal consent was obtained. This procedure was performed by Santiago GladHeather Jeff Mccallum, PA-C at 9:55 AM. Site: Posterior pinna of right ear Sterile procedures observed Anesthetic used (type and amt): topical lidocaine, patient refused infiltration of lidocaine Blade size: 11 Drainage: purulent  drainage Complexity: basic Site anesthetized, incision made over site, wound drained and explored loculations, rinsed with copious amounts of normal saline, covered with dry, sterile dressing.  Pt tolerated procedure well without complications.  Instructions for care discussed verbally and pt provided with additional written instructions for homecare and f/u.  Labs Review Labs Reviewed - No data to display  Imaging Review No results found.   EKG Interpretation None      MDM   Final diagnoses:  None    Patient with skin abscess of the pinna of the left ear. Incision and drainage performed in the ED today.  Abscess was not large enough to warrant packing or drain placement. Wound recheck in 2 days. Supportive care and return precautions discussed.  Pt sent home with antibiotics. No mastoid tenderness or erythema.  The patient appears reasonably screened and/or stabilized for discharge and I doubt any other emergent medical condition requiring further screening, evaluation, or treatment in the ED prior to discharge.  I personally performed the services described in this documentation, which was scribed in my presence. The recorded information has been reviewed and is accurate.    Santiago GladHeather Rad Gramling, PA-C 08/20/15 2139  Santiago GladHeather Kaydenn Mclear, PA-C 08/20/15 [id number]  Leta BaptistEmily Roe Nguyen, MD 08/31/15 2026

## 2015-08-19 NOTE — ED Notes (Signed)
Pt refused Lidocaine.

## 2015-08-19 NOTE — ED Notes (Signed)
C/o abscess to right ear, posteriorly. Hx of same requiring I&D.

## 2015-08-25 ENCOUNTER — Ambulatory Visit (INDEPENDENT_AMBULATORY_CARE_PROVIDER_SITE_OTHER): Payer: Medicaid Other | Admitting: Obstetrics and Gynecology

## 2015-08-25 ENCOUNTER — Other Ambulatory Visit (HOSPITAL_COMMUNITY)
Admission: RE | Admit: 2015-08-25 | Discharge: 2015-08-25 | Disposition: A | Discharge status: Home / Self Care | Payer: Self-pay | Source: Ambulatory Visit | Attending: Obstetrics and Gynecology | Admitting: Obstetrics and Gynecology

## 2015-08-25 DIAGNOSIS — R87611 Atypical squamous cells cannot exclude high grade squamous intraepithelial lesion on cytologic smear of cervix (ASC-H): Secondary | ICD-10-CM

## 2015-08-25 DIAGNOSIS — Z3202 Encounter for pregnancy test, result negative: Secondary | ICD-10-CM

## 2015-08-25 DIAGNOSIS — IMO0002 Reserved for concepts with insufficient information to code with codable children: Secondary | ICD-10-CM

## 2015-08-25 LAB — POCT PREGNANCY, URINE
PREG TEST UR: NEGATIVE
PREG TEST UR: NEGATIVE

## 2015-08-25 NOTE — Progress Notes (Signed)
GYNECOLOGY CLINIC COLPOSCOPY VISIT AND PROCEDURE NOTE  25 y.o. G1P1001 here for colposcopy for asc-h pap smear on 3/17. Prior cervical cytology and/or colposcopy findings: abnormal pap smear 3 months ago, with colposcopy, told abnormal, sounds like needed a leep, but no medicaid and so didn't show up for procedure. The patient reports the following prior treatments to the vulva/vagina/cervix: previous colpo w/ biopsy. The patient is not a cigarette smoker. The patient is not immunosuppressed. The patient is not pregnant. The patient is not taking anticoagulants and denies allergy to iodine.  Patient given informed consent, signed copy in the chart, time out was performed. Urine pregnancy test performed and confirmed to be negative.  Placed in lithotomy position.   Gross findings:   Vagina: normal  Vulva: normal  Cervix: normal  Visualization after:  Acetic acid: small lesion w/ mosaicism and sharp acetowhite borders 12 o clock  Green or blue filter: no abnormal vessels   Upper one-third of vagina examined, revealing: normal mucosa  Biopsies obtained: one @ 12 o clock  ECC specimen obtained: yes  Colposcopy adequate? Yes  All specimens were labelled and sent to pathology.  Patient was given post procedure instructions.  Will follow up pathology and manage accordingly.      Shonna ChockNoah Trichelle Lehan, MD OB/GYN Fellow

## 2015-08-26 ENCOUNTER — Encounter: Payer: Self-pay | Admitting: Obstetrics and Gynecology

## 2015-08-26 ENCOUNTER — Telehealth: Payer: Self-pay | Admitting: *Deleted

## 2015-08-26 ENCOUNTER — Telehealth: Payer: Self-pay | Admitting: Obstetrics and Gynecology

## 2015-08-26 DIAGNOSIS — IMO0002 Reserved for concepts with insufficient information to code with codable children: Secondary | ICD-10-CM | POA: Insufficient documentation

## 2015-08-26 DIAGNOSIS — R87610 Atypical squamous cells of undetermined significance on cytologic smear of cervix (ASC-US): Secondary | ICD-10-CM | POA: Insufficient documentation

## 2015-08-26 NOTE — Telephone Encounter (Signed)
Per Dr. Ashok PallWouk, pt had normal Colpo and needs Pap w/co-testing in 1 year. Pt was called and given these results. Pt was also advised that we would like to have a copy on file of her Pap and Colpo performed @  Dr. Elsie StainMarshall's office one year ago. She would need to sign ROI in order for us to obtain those records. Pt voiced understanding of all information given.

## 2015-08-26 NOTE — Telephone Encounter (Signed)
I called Dr. 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 Repeat colpo this week (pap last month showed asc-h) shows no CIN. Will need pap with co-test in 12 months. Nursing has notified.

## 2015-09-08 ENCOUNTER — Encounter (HOSPITAL_COMMUNITY): Payer: Self-pay | Admitting: Emergency Medicine

## 2015-09-08 ENCOUNTER — Ambulatory Visit (HOSPITAL_COMMUNITY)
Admission: EM | Admit: 2015-09-08 | Discharge: 2015-09-08 | Disposition: A | Discharge status: Home / Self Care | Payer: Self-pay | Attending: Emergency Medicine | Admitting: Emergency Medicine

## 2015-09-08 DIAGNOSIS — M542 Cervicalgia: Secondary | ICD-10-CM | POA: Insufficient documentation

## 2015-09-08 DIAGNOSIS — J029 Acute pharyngitis, unspecified: Secondary | ICD-10-CM | POA: Insufficient documentation

## 2015-09-08 LAB — POCT RAPID STREP A: Streptococcus, Group A Screen (Direct): NEGATIVE

## 2015-09-08 MED ORDER — DEXAMETHASONE SODIUM PHOSPHATE 10 MG/ML IJ SOLN
INTRAMUSCULAR | Status: AC
  Filled 2015-09-08: qty 1

## 2015-09-08 MED ORDER — KETOROLAC TROMETHAMINE 60 MG/2ML IM SOLN
INTRAMUSCULAR | Status: AC
  Filled 2015-09-08: qty 2

## 2015-09-08 MED ORDER — TRAMADOL HCL 50 MG PO TABS: 50.0000 mg | ORAL_TABLET | Freq: Four times a day (QID) | ORAL | Status: DC | PRN

## 2015-09-08 MED ORDER — DEXAMETHASONE SODIUM PHOSPHATE 10 MG/ML IJ SOLN
10.0000 mg | Freq: Once | INTRAMUSCULAR | Status: AC
  Administered 2015-09-08: 10 mg via INTRAMUSCULAR

## 2015-09-08 MED ORDER — IBUPROFEN 600 MG PO TABS: 600.0000 mg | ORAL_TABLET | Freq: Four times a day (QID) | ORAL | Status: DC | PRN

## 2015-09-08 MED ORDER — KETOROLAC TROMETHAMINE 60 MG/2ML IM SOLN
60.0000 mg | Freq: Once | INTRAMUSCULAR | Status: AC
  Administered 2015-09-08: 60 mg via INTRAMUSCULAR

## 2015-09-08 MED ORDER — CYCLOBENZAPRINE HCL 5 MG PO TABS: 5.0000 mg | ORAL_TABLET | Freq: Three times a day (TID) | ORAL | Status: DC | PRN

## 2015-09-08 NOTE — Discharge Instructions (Signed)
Some of your pain is coming from the muscles in your neck. You also likely have a virus causing the sore throat. Your strep test is negative.  We will call if the culture comes back positive. Take ibuprofen every 6 hours as needed for pain. Use the flexeril every 8 hours for muscle spasms or pain. Use the tramadol every 6-8 hours as needed for severe pain. If you develop fevers, confusion, or a rash, please go to the emergency room.

## 2015-09-08 NOTE — ED Notes (Signed)
[facility] a [facility] of a sore throat and stiff neck x 2 days.

## 2015-09-08 NOTE — ED Provider Notes (Signed)
CSN: [id number]     Arrival date & time 09/08/15  1720 History   First MD Initiated Contact with Patient 09/08/15 1727     Chief Complaint  Patient presents with  . Sore Throat  . Torticollis   (Consider location/radiation/quality/duration/timing/severity/associated sxs/prior Treatment) HPI She is a 25 year old woman here for evaluation of throat and neck pain. She states this started 2 days ago and has gradually been getting worse. She is tearful during the history.  She states the pain is in her throat as well as throughout her neck and upper shoulders. She reports a mild cough. No nasal congestion or rhinorrhea. No ear pain. No known fevers. She reports some nausea, but no vomiting. She has not tried any medications. She denies any recent tick bites or bug bites.  Past Medical History  Diagnosis Date  . Acid reflux   . Vaginal Pap smear, abnormal   . History of trichomoniasis 07/2014  . History of chlamydia 07/2014   Past Surgical History  Procedure Laterality Date  . No past surgeries     Family History  Problem Relation Age of Onset  . Hypertension Other   . Diabetes Paternal Grandmother   . Hypertension Paternal Grandmother    Social History  Substance Use Topics  . Smoking status: Never Smoker   . Smokeless tobacco: Never Used  . Alcohol Use: No   OB History    Gravida Para Term Preterm AB TAB SAB Ectopic Multiple Living   Review of Systems As in history of present illness Allergies  Review of patient's allergies indicates no known allergies.  Home Medications   Prior to Admission medications   Medication Sig Start Date End Date Taking? Authorizing Provider  cyclobenzaprine (FLEXERIL) 5 MG tablet Take 1 tablet (5 mg total) by mouth 3 (three) times daily as needed for muscle spasms. 09/08/15   Charm Rings, MD  ibuprofen (ADVIL,MOTRIN) 600 MG tablet Take 1 tablet (600 mg total) by mouth every 6 (six) hours as needed for moderate pain. 09/08/15   Charm Rings, MD  traMADol (ULTRAM) 50 MG tablet Take 1 tablet (50 mg total) by mouth every 6 (six) hours as needed. 09/08/15   Charm Rings, MD   Meds Ordered and Administered this Visit   Medications  ketorolac (TORADOL) injection 60 mg (60 mg Intramuscular Given 09/08/15 1753)  dexamethasone (DECADRON) injection 10 mg (10 mg Intramuscular Given 09/08/15 1753)    BP 140/87 mmHg  Pulse 94  Temp(Src) 98.3 F (36.8 C) (Oral)  Resp 20  SpO2 95% No data found.   Physical Exam  Constitutional: She is oriented to person, place, and time. She appears well-developed and well-nourished. She appears distressed (earful. When asked to lay down the patient holds her neck as she does so. She is unable to lie on the exam table without towels under her neck. She is unable to flex or extend her neck due to pain.).  HENT:  Mouth/Throat: Oropharynx is clear and moist. No oropharyngeal exudate.  Neck:  Patient refuses range of motion testing due to pain. She is diffusely tender to palpation around the neck and upper shoulder area.  Cardiovascular: Normal rate and regular rhythm.   Pulmonary/Chest: Effort normal and breath sounds normal. No respiratory distress. She has no wheezes. She has no rales.  Lymphadenopathy:    She has no cervical adenopathy.  Neurological: She is alert and oriented  to person, place, and time.  Negative Kernig sign    ED Course  Procedures (including critical care time)  Labs Review Labs Reviewed  POCT RAPID STREP A    Imaging Review No results found.   MDM   1. Neck pain   2. Sore throat    Rapid strep is negative. She appears much more comfortable and is able to move her neck more after the Toradol and Decadron. She states the pain is improved. No fevers or meningeal signs to suggest retropharyngeal abscess or meningitis. We'll treat symptomatically with ibuprofen and Flexeril. Tramadol as needed for severe pain. Throat culture sent. Reviewed reasons to go to the  ER.    Charm RingsErin J Arina Torry, MD 09/08/15 [phone]

## 2015-09-11 LAB — CULTURE, GROUP A STREP (THRC)

## 2015-10-05 ENCOUNTER — Inpatient Hospital Stay (HOSPITAL_COMMUNITY)
Admission: AD | Admit: 2015-10-05 | Discharge: 2015-10-05 | Disposition: A | Discharge status: Home / Self Care | Payer: Self-pay | Source: Ambulatory Visit | Attending: Obstetrics & Gynecology | Admitting: Obstetrics & Gynecology

## 2015-10-05 ENCOUNTER — Encounter (HOSPITAL_COMMUNITY): Payer: Self-pay | Admitting: *Deleted

## 2015-10-05 DIAGNOSIS — Z3202 Encounter for pregnancy test, result negative: Secondary | ICD-10-CM | POA: Insufficient documentation

## 2015-10-05 DIAGNOSIS — A499 Bacterial infection, unspecified: Secondary | ICD-10-CM

## 2015-10-05 DIAGNOSIS — N760 Acute vaginitis: Secondary | ICD-10-CM | POA: Insufficient documentation

## 2015-10-05 DIAGNOSIS — B9689 Other specified bacterial agents as the cause of diseases classified elsewhere: Secondary | ICD-10-CM

## 2015-10-05 LAB — URINALYSIS, ROUTINE W REFLEX MICROSCOPIC
BILIRUBIN URINE: NEGATIVE
Glucose, UA: NEGATIVE mg/dL
KETONES UR: NEGATIVE mg/dL
Leukocytes, UA: NEGATIVE
NITRITE: NEGATIVE
PROTEIN: NEGATIVE mg/dL
Specific Gravity, Urine: 1.02 (ref 1.005–1.030)
pH: 6.5 (ref 5.0–8.0)

## 2015-10-05 LAB — WET PREP, GENITAL
Sperm: NONE SEEN
TRICH WET PREP: NONE SEEN
Yeast Wet Prep HPF POC: NONE SEEN

## 2015-10-05 LAB — URINE MICROSCOPIC-ADD ON

## 2015-10-05 LAB — POCT PREGNANCY, URINE: Preg Test, Ur: NEGATIVE

## 2015-10-05 MED ORDER — METRONIDAZOLE 500 MG PO TABS: 500.0000 mg | ORAL_TABLET | Freq: Two times a day (BID) | ORAL | Status: DC

## 2015-10-05 NOTE — Discharge Instructions (Signed)

## 2015-10-05 NOTE — MAU Provider Note (Signed)
History     CSN: [id number]  Arrival date and time: 10/05/15 2154   First Provider Initiated Contact with Patient 10/05/15 2248      Chief Complaint  Patient presents with  . Vaginal Discharge  . Abdominal Pain   HPI  [name] is a 25 y.o. G1P1001 who presents to MAU today with complaint of abdominal pain and vaginal discharge x 3-4 days. The patient states thick, white discharge with odor. She denies itching or irritation. She denies vaginal bleeding. She has not had a period since discontinuing Depo Provera last year. She is sexually active. She rates her pain at 7/10 now and has taken Ibuprofen with minimal relief. She denies fever or UTI symptoms.    OB History    Gravida Para Term Preterm AB TAB SAB Ectopic Multiple Living   1 1 1       1       Past Medical History  Diagnosis Date  . Acid reflux   . Vaginal Pap smear, abnormal   . History of trichomoniasis 07/2014  . History of chlamydia 07/2014    Past Surgical History  Procedure Laterality Date  . No past surgeries      Family History  Problem Relation Age of Onset  . Hypertension Other   . Diabetes Paternal Grandmother   . Hypertension Paternal Grandmother     Social History  Substance Use Topics  . Smoking status: Never Smoker   . Smokeless tobacco: Never Used  . Alcohol Use: No    Allergies: No Known Allergies  Prescriptions prior to admission  Medication Sig Dispense Refill Last Dose  . cyclobenzaprine (FLEXERIL) 5 MG tablet Take 1 tablet (5 mg total) by mouth 3 (three) times daily as needed for muscle spasms. 30 tablet 0   . ibuprofen (ADVIL,MOTRIN) 600 MG tablet Take 1 tablet (600 mg total) by mouth every 6 (six) hours as needed for moderate pain. 30 tablet 0   . traMADol (ULTRAM) 50 MG tablet Take 1 tablet (50 mg total) by mouth every 6 (six) hours as needed. 15 tablet 0     Review of Systems  Constitutional: Negative for fever and malaise/fatigue.  Gastrointestinal: Positive for  abdominal pain. Negative for nausea, vomiting, diarrhea and constipation.  Genitourinary: Negative for dysuria, urgency and frequency.       + vaginal discharge Neg - vaginal bleeding   Physical Exam   Blood pressure 116/81, pulse 71, temperature 98.2 F (36.8 C), temperature source Oral, resp. rate 18, height 5' (1.524 m), weight 148 lb (67.132 kg), SpO2 100 %.  Physical Exam  Nursing note and vitals reviewed. Constitutional: She is oriented to person, place, and time. She appears well-developed and well-nourished. No distress.  HENT:  Head: Normocephalic and atraumatic.  Cardiovascular: Normal rate.   Respiratory: Effort normal.  GI: Soft. She exhibits no distension and no mass. There is no tenderness. There is no rebound and no guarding.  Genitourinary: Uterus is not enlarged and not tender. Cervix exhibits no motion tenderness, no discharge and no friability. Right adnexum displays no mass and no tenderness. Left adnexum displays no mass and no tenderness. No bleeding in the vagina. Vaginal discharge (small amount of thin, white discharge with mild odor) found.  Neurological: She is alert and oriented to person, place, and time.  Skin: Skin is warm and dry. No erythema.  Psychiatric: She has a normal mood and affect.    Results for orders placed or performed during the hospital  encounter of 10/05/15 (from the past 24 hour(s))  Urinalysis, Routine w reflex microscopic ([facility])     Status: Abnormal   Collection Time: 10/05/15 10:06 PM  Result Value Ref Range   Color, Urine YELLOW YELLOW   APPearance CLEAR CLEAR   Specific Gravity, Urine 1.020 1.005 - 1.030   pH 6.5 5.0 - 8.0   Glucose, UA NEGATIVE NEGATIVE mg/dL   Hgb urine dipstick MODERATE (A) NEGATIVE   Bilirubin Urine NEGATIVE NEGATIVE   Ketones, ur NEGATIVE NEGATIVE mg/dL   Protein, ur NEGATIVE NEGATIVE mg/dL   Nitrite NEGATIVE NEGATIVE   Leukocytes, UA NEGATIVE NEGATIVE  Urine microscopic-add on     Status:  Abnormal   Collection Time: 10/05/15 10:06 PM  Result Value Ref Range   Squamous Epithelial / LPF 0-5 (A) NONE SEEN   WBC, UA 0-5 0 - 5 WBC/hpf   RBC / HPF 0-5 0 - 5 RBC/hpf   Bacteria, UA RARE (A) NONE SEEN  Pregnancy, urine POC     Status: None   Collection Time: 10/05/15 10:17 PM  Result Value Ref Range   Preg Test, Ur NEGATIVE NEGATIVE  Wet prep, genital     Status: Abnormal   Collection Time: 10/05/15 10:52 PM  Result Value Ref Range   Yeast Wet Prep HPF POC NONE SEEN NONE SEEN   Trich, Wet Prep NONE SEEN NONE SEEN   Clue Cells Wet Prep HPF POC PRESENT (A) NONE SEEN   WBC, Wet Prep HPF POC FEW (A) NONE SEEN   Sperm NONE SEEN     MAU Course  Procedures None  MDM UPT - negative UA, wet prep, GC/Chlamydia today Patient refused blood draw for HIV and RPR today   Assessment and Plan  A: Bacterial vaginosis  P: Discharge home Rx for Flagyl given to patient  Continue Ibuprofen PRN for pain Patient advised to follow-up with [facility] as needed  Patient may return to MAU as needed or if her condition were to change or worsen  Marny LowensteinJulie N Wenzel, PA-C  10/05/2015, 11:07 PM

## 2015-10-05 NOTE — MAU Note (Signed)
Pt c/o abdominal pain and vaginal discharge that started 3 days ago-more in right side. Sharp, intermittent, rates 7/10. Pt took ibuprofen around 5pm-states that it helped some. Denies vaginal bleeding. LMP: unsure-not on birth control. States that she has been off depo since November.

## 2015-10-06 LAB — GC/CHLAMYDIA PROBE AMP (~~LOC~~) NOT AT ARMC
Chlamydia: NEGATIVE
Neisseria Gonorrhea: NEGATIVE
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
## 2015-12-26 ENCOUNTER — Encounter: Payer: Self-pay | Admitting: *Deleted

## 2016-02-19 ENCOUNTER — Inpatient Hospital Stay (HOSPITAL_COMMUNITY)
Admission: AD | Admit: 2016-02-19 | Discharge: 2016-02-19 | Disposition: A | Discharge status: Home / Self Care | Payer: Self-pay | Source: Ambulatory Visit | Attending: Obstetrics & Gynecology | Admitting: Obstetrics & Gynecology

## 2016-02-19 ENCOUNTER — Encounter (HOSPITAL_COMMUNITY): Payer: Self-pay

## 2016-02-19 DIAGNOSIS — N926 Irregular menstruation, unspecified: Secondary | ICD-10-CM

## 2016-02-19 DIAGNOSIS — Z3202 Encounter for pregnancy test, result negative: Secondary | ICD-10-CM

## 2016-02-19 DIAGNOSIS — N912 Amenorrhea, unspecified: Secondary | ICD-10-CM | POA: Insufficient documentation

## 2016-02-19 LAB — URINALYSIS, ROUTINE W REFLEX MICROSCOPIC
Bilirubin Urine: NEGATIVE
Glucose, UA: NEGATIVE mg/dL
Ketones, ur: NEGATIVE mg/dL
Leukocytes, UA: NEGATIVE
Nitrite: NEGATIVE
PH: 8 (ref 5.0–8.0)
Protein, ur: NEGATIVE mg/dL
SPECIFIC GRAVITY, URINE: 1.02 (ref 1.005–1.030)

## 2016-02-19 LAB — URINE MICROSCOPIC-ADD ON
BACTERIA UA: NONE SEEN
WBC, UA: NONE SEEN WBC/hpf (ref 0–5)

## 2016-02-19 LAB — POCT PREGNANCY, URINE: PREG TEST UR: NEGATIVE

## 2016-02-19 NOTE — Progress Notes (Signed)
Pt refused to have vitals taken for discharge.

## 2016-02-19 NOTE — MAU Provider Note (Signed)
History     CSN: [id number]  Arrival date and time: 02/19/16 1438   First Provider Initiated Contact with Patient 02/19/16 1522     Chief Complaint  Patient presents with  . Abdominal Pain   HPI Sara Higgins is a 25 y.o. female who presents with possible pregnancy and abdominal cramping. Patient states LMP 9/29; has not bled since and not using contraception. Had negative HPT 3 weeks ago. When asked what her menstrual history was like she states she has "never ever" missed a period & that her period is "always" on the 28th of the month. Questioned patient regarding her hx of depo injections & amenhorrhea per her last MAU visit in July. Pt clarified stating she did not have a period x 1+ yr while on depo provera (last injection 02/2016) & she just started having cycles again the end of July 2017.  During interview patient states that she just wants a blood test to see if she's pregnant. Denies vaginal bleeding, vaginal discharge, nausea, diarrhea, or fever. Vomited once last week. Feels lower abdominal cramping that lasts for a few seconds at a time. Pain is intermittent and occurs "maybe" once a week.   Past Medical History:  Diagnosis Date  . Acid reflux   . History of chlamydia 07/2014  . History of trichomoniasis 07/2014  . Vaginal Pap smear, abnormal     Past Surgical History:  Procedure Laterality Date  . NO PAST SURGERIES      Family History  Problem Relation Age of Onset  . Hypertension Other   . Diabetes Paternal Grandmother   . Hypertension Paternal Grandmother     Social History  Substance Use Topics  . Smoking status: Never Smoker  . Smokeless tobacco: Never Used  . Alcohol use No    Allergies: No Known Allergies  Prescriptions Prior to Admission  Medication Sig Dispense Refill Last Dose  . cyclobenzaprine (FLEXERIL) 5 MG tablet Take 1 tablet (5 mg total) by mouth 3 (three) times daily as needed for muscle spasms. 30 tablet 0   . ibuprofen  (ADVIL,MOTRIN) 600 MG tablet Take 1 tablet (600 mg total) by mouth every 6 (six) hours as needed for moderate pain. 30 tablet 0   . metroNIDAZOLE (FLAGYL) 500 MG tablet Take 1 tablet (500 mg total) by mouth 2 (two) times daily. 14 tablet 0   . traMADol (ULTRAM) 50 MG tablet Take 1 tablet (50 mg total) by mouth every 6 (six) hours as needed. 15 tablet 0     Review of Systems  Constitutional: Negative.   Gastrointestinal: Negative.        No symptoms in the last week  Genitourinary: Negative.    Physical Exam   Blood pressure 135/84, pulse 96, temperature 98.6 F (37 C), temperature source Oral, resp. rate 18, last menstrual period 01/02/2016.  Physical Exam  Nursing note and vitals reviewed. Constitutional: She is oriented to person, place, and time. She appears well-developed and well-nourished. No distress.  HENT:  Head: Normocephalic and atraumatic.  Eyes: Conjunctivae are normal. Right eye exhibits no discharge. Left eye exhibits no discharge. No scleral icterus.  Neck: Normal range of motion.  Cardiovascular: Normal rate, regular rhythm and normal heart sounds.   No murmur heard. Respiratory: Effort normal and breath sounds normal. No respiratory distress. She has no wheezes.  GI: Soft. Bowel sounds are normal. She exhibits no distension. There is no tenderness. There is no rebound.  Neurological: She is alert and oriented to person,  place, and time.  Skin: Skin is warm and dry. She is not diaphoretic.  Psychiatric: She has a normal mood and affect. Her behavior is normal. Judgment and thought content normal.    MAU Course  Procedures Results for orders placed or performed during the hospital encounter of 02/19/16 (from the past 24 hour(s))  Urinalysis, Routine w reflex microscopic (not [facility])     Status: Abnormal   Collection Time: 02/19/16  2:51 PM  Result Value Ref Range   Color, Urine YELLOW YELLOW   APPearance CLEAR CLEAR   Specific Gravity, Urine 1.020 1.005 -  1.030   pH 8.0 5.0 - 8.0   Glucose, UA NEGATIVE NEGATIVE mg/dL   Hgb urine dipstick MODERATE (A) NEGATIVE   Bilirubin Urine NEGATIVE NEGATIVE   Ketones, ur NEGATIVE NEGATIVE mg/dL   Protein, ur NEGATIVE NEGATIVE mg/dL   Nitrite NEGATIVE NEGATIVE   Leukocytes, UA NEGATIVE NEGATIVE  Urine microscopic-add on     Status: Abnormal   Collection Time: 02/19/16  2:51 PM  Result Value Ref Range   Squamous Epithelial / LPF 0-5 (A) NONE SEEN   WBC, UA NONE SEEN 0 - 5 WBC/hpf   RBC / HPF 0-5 0 - 5 RBC/hpf   Bacteria, UA NONE SEEN NONE SEEN   Urine-Other MUCOUS PRESENT   Pregnancy, urine POC     Status: None   Collection Time: 02/19/16  2:53 PM  Result Value Ref Range   Preg Test, Ur NEGATIVE NEGATIVE    MDM UPT negative Discussed evaluation to be done today; initially pt agreeable to being swabbed for STDs; after talking about blood work, pt doesn't want blood work unless it includes BHCG. BHCG not indicated today.  VSS & NAD. Pt completely asymptomatic today & no emergency condition present. Patient requesting to leave. [facility] as she has previously been a patient there & would like to follow up with them.   Assessment and Plan  A: 1. Negative pregnancy test   2. Missed menses    P: Discharge home Repeat pregnancy test in 1 week if no menses [facility] or as needed Discussed purpose of MAU  Sara Higgins 02/19/2016, 3:22 PM

## 2016-02-19 NOTE — MAU Note (Signed)
Pt reports she has had achey lower abd pain off and on x 1 week.LMP 01/01/16. Pregnancy test at home negative 3 weeks ago.

## 2016-02-19 NOTE — Discharge Instructions (Signed)

## 2016-03-31 ENCOUNTER — Emergency Department (HOSPITAL_COMMUNITY)
Admission: EM | Admit: 2016-03-31 | Discharge: 2016-03-31 | Disposition: A | Discharge status: Home / Self Care | Payer: Self-pay | Attending: Emergency Medicine | Admitting: Emergency Medicine

## 2016-03-31 ENCOUNTER — Encounter (HOSPITAL_COMMUNITY): Payer: Self-pay

## 2016-03-31 DIAGNOSIS — H6001 Abscess of right external ear: Secondary | ICD-10-CM | POA: Insufficient documentation

## 2016-03-31 MED ORDER — SULFAMETHOXAZOLE-TRIMETHOPRIM 800-160 MG PO TABS: 1.0000 | ORAL_TABLET | Freq: Two times a day (BID) | ORAL | 0 refills | Status: AC

## 2016-03-31 MED ORDER — IBUPROFEN 600 MG PO TABS: 600.0000 mg | ORAL_TABLET | Freq: Four times a day (QID) | ORAL | 0 refills | Status: DC | PRN

## 2016-03-31 NOTE — ED Triage Notes (Signed)
Pt with earache. States feels knot below ear also. Started about a week ago. No fever.

## 2016-03-31 NOTE — Discharge Instructions (Signed)
Warm moist compresses several times a day. Take bactrim as prescribed until all gone. Ibuprofen and tylenol for pain. Follow up with your doctor or general surgery or dermatologist for definitive treatment.

## 2016-03-31 NOTE — ED Provider Notes (Signed)
[facility]-EMERGENCY DEPT Provider Note   CSN: [id number] Arrival date & time: 03/31/16  1148   By signing my name below, I, Cynda AcresHailei Fulton, attest that this documentation has been prepared under the direction and in the presence of Jaynie Crumbleatyana Kaeleb Emond, PA-C Electronically Signed: Cynda AcresHailei Fulton, Scribe. 03/31/16. 12:10 PM.   History   Chief Complaint Chief Complaint  Patient presents with  . Otalgia    HPI Comments: Sara Higgins is a 25 y.o. female with a PMHx of GERD, who presents to the Emergency Department complaining of gradually worsening behind the ear pain that began one week ago. Patient does have a history of multiple ear abscess. Patient states she hasn't tried any medications or heating pad for relief. Patient denies any fever or other symptoms.    The history is provided by the patient. No language interpreter was used.    Past Medical History:  Diagnosis Date  . Acid reflux   . History of chlamydia 07/2014  . History of trichomoniasis 07/2014  . Vaginal Pap smear, abnormal     Patient Active Problem List   Diagnosis Date Noted  . Pap smear with atypical squamous cells, cannot exclude high grade squamous intraepithelial lesion (ASC-H) 08/26/2015    Past Surgical History:  Procedure Laterality Date  . NO PAST SURGERIES      OB History    Gravida Para Term Preterm AB Living   1 1 1     1    SAB TAB Ectopic Multiple Live Births                   Home Medications    Prior to Admission medications   Medication Sig Start Date End Date Taking? Authorizing Provider  cyclobenzaprine (FLEXERIL) 5 MG tablet Take 1 tablet (5 mg total) by mouth 3 (three) times daily as needed for muscle spasms. 09/08/15   Charm RingsErin J Honig, MD  ibuprofen (ADVIL,MOTRIN) 600 MG tablet Take 1 tablet (600 mg total) by mouth every 6 (six) hours as needed for moderate pain. 09/08/15   Charm RingsErin J Honig, MD  metroNIDAZOLE (FLAGYL) 500 MG tablet Take 1 tablet (500 mg total) by mouth 2 (two) times  daily. 10/05/15   Marny LowensteinJulie N Wenzel, PA-C  traMADol (ULTRAM) 50 MG tablet Take 1 tablet (50 mg total) by mouth every 6 (six) hours as needed. 09/08/15   Charm RingsErin J Honig, MD    Family History Family History  Problem Relation Age of Onset  . Hypertension Other   . Diabetes Paternal Grandmother   . Hypertension Paternal Grandmother     Social History Social History  Substance Use Topics  . Smoking status: Never Smoker  . Smokeless tobacco: Never Used  . Alcohol use No     Allergies   Patient has no known allergies.   Review of Systems Review of Systems  Constitutional: Negative for chills and fever.  HENT: Positive for ear pain and facial swelling. Negative for ear discharge.   Musculoskeletal: Positive for neck pain.  Neurological: Negative for headaches.     Physical Exam Updated Vital Signs BP 113/63 (BP Location: Right Arm)   Pulse 70   Temp 98.3 F (36.8 C) (Oral)   Resp 18   Ht 5' (1.524 m)   Wt 149 lb (67.6 kg)   LMP 02/13/2016   SpO2 97%   BMI 29.10 kg/m   Physical Exam  Constitutional: She is oriented to person, place, and time. She appears well-developed and well-nourished. No distress.  HENT:  2x2 cm area of erythema, induration, flucuance, just posterior to the right ear. TTP. Mild tenderness to postauricular lymph nodes. Normal right ear canal and TM  Eyes: Conjunctivae are normal.  Neck: Normal range of motion. Neck supple.  Neurological: She is alert and oriented to person, place, and time.  Skin: Skin is warm and dry.  Nursing note and vitals reviewed.    ED Treatments / Results  DIAGNOSTIC STUDIES: Oxygen Saturation is 97% on RA, normal by my interpretation.    COORDINATION OF CARE: 12:29 PM Discussed treatment plan with pt at bedside and pt agreed to plan.  Labs (all labs ordered are listed, but only abnormal results are displayed) Labs Reviewed - No data to display  EKG  EKG Interpretation None       Radiology No results  found.  Procedures Procedures (including critical care time)  INCISION AND DRAINAGE Performed by: Jaynie CrumbleKIRICHENKO, Zackariah Vanderpol A Consent: Verbal consent obtained. Risks and benefits: risks, benefits and alternatives were discussed Type: abscess  Body area: right posterior ear  Anesthesia: topical  Incision was made with a scalpel.  Local anesthetic: topical anesthetic freeze spray (pt refused injected lidocaine)  Complexity: simple Blunt dissection to break up loculations  Drainage: purulent  Drainage amount: large   Patient tolerance: Patient tolerated the procedure well with no immediate complications.    Medications Ordered in ED Medications - No data to display   Initial Impression / Assessment and Plan / ED Course  I have reviewed the triage vital signs and the nursing notes.  Pertinent labs & imaging results that were available during my care of the patient were reviewed by me and considered in my medical decision making (see chart for details).  Clinical Course     Patient with recurrent abscess to the right posterior ear. No other associated symptoms. Incised and drained with large purulent drainage. We'll place on Bactrim, ibuprofen or Tylenol for pain, follow up as needed.  Vitals:   03/31/16 1154  BP: 113/63  Pulse: 70  Resp: 18  Temp: 98.3 F (36.8 C)  TempSrc: Oral  SpO2: 97%  Weight: 67.6 kg  Height: 5' (1.524 m)     Final Clinical Impressions(s) / ED Diagnoses   Final diagnoses:  Abscess of external ear, right    New Prescriptions Discharge Medication List as of 03/31/2016 12:30 PM    START taking these medications   Details  sulfamethoxazole-trimethoprim (BACTRIM DS,SEPTRA DS) 800-160 MG tablet Take 1 tablet by mouth 2 (two) times daily., Starting Wed 03/31/2016, Until Wed 04/07/2016, Print        I personally performed the services described in this documentation, which was scribed in my presence. The recorded information has been  reviewed and is accurate.    Jaynie Crumbleatyana Shanavia Makela, PA-C 03/31/16 1301    Loren Raceravid Yelverton, MD 03/31/16 1600

## 2016-05-29 ENCOUNTER — Encounter (HOSPITAL_COMMUNITY): Payer: Self-pay | Admitting: Emergency Medicine

## 2016-05-29 ENCOUNTER — Emergency Department (HOSPITAL_COMMUNITY)
Admission: EM | Admit: 2016-05-29 | Discharge: 2016-05-29 | Disposition: A | Discharge status: Home / Self Care | Payer: Self-pay | Attending: Emergency Medicine | Admitting: Emergency Medicine

## 2016-05-29 DIAGNOSIS — H6001 Abscess of right external ear: Secondary | ICD-10-CM | POA: Insufficient documentation

## 2016-05-29 DIAGNOSIS — Z79899 Other long term (current) drug therapy: Secondary | ICD-10-CM | POA: Insufficient documentation

## 2016-05-29 MED ORDER — LIDOCAINE HCL 2 % IJ SOLN: 20.0000 mL | Freq: Once | INTRAMUSCULAR | Status: DC

## 2016-05-29 MED ORDER — LIDOCAINE HCL 2 % IJ SOLN
INTRAMUSCULAR | Status: AC
  Administered 2016-05-29: 400 mg
  Filled 2016-05-29: qty 20

## 2016-05-29 MED ORDER — SULFAMETHOXAZOLE-TRIMETHOPRIM 800-160 MG PO TABS: 1.0000 | ORAL_TABLET | Freq: Two times a day (BID) | ORAL | 0 refills | Status: AC

## 2016-05-29 NOTE — ED Provider Notes (Signed)
[facility]-EMERGENCY DEPT Provider Note    By signing my name below, I, Earmon PhoenixJennifer Waddell, attest that this documentation has been prepared under the direction and in the presence of Audry Piliyler Luwana Butrick, PA-C. Electronically Signed: Earmon PhoenixJennifer Waddell, ED Scribe. 05/29/16. 10:26 AM.    History   Chief Complaint Chief Complaint  Patient presents with  . Abscess    swelling behind r/ear   The history is provided by the patient and medical records. No language interpreter was used.    Sara Higgins is a 26 y.o. female who presents to the Emergency Department complaining of an abscess to the back of the right ear lobe that appeared three days ago. She reports associated moderate throbbing pain. She states she has had this problem in the past, last occurrence about three months ago. She has not done anything to treat the symptoms. Touching the area increases the pain. She denies alleviating factors. She denies fever, chills, nausea, vomiting, facial swelling, difficulty breathing or swallowing. LMP was last week and states she is on birth control pills.   Past Medical History:  Diagnosis Date  . Acid reflux   . History of chlamydia 07/2014  . History of trichomoniasis 07/2014  . Vaginal Pap smear, abnormal     Patient Active Problem List   Diagnosis Date Noted  . Pap smear with atypical squamous cells, cannot exclude high grade squamous intraepithelial lesion (ASC-H) 08/26/2015    Past Surgical History:  Procedure Laterality Date  . NO PAST SURGERIES    . WISDOM TOOTH EXTRACTION      OB History    Gravida Para Term Preterm AB Living   1 1 1     1    SAB TAB Ectopic Multiple Live Births                   Home Medications    Prior to Admission medications   Medication Sig Start Date End Date Taking? Authorizing Provider  cyclobenzaprine (FLEXERIL) 5 MG tablet Take 1 tablet (5 mg total) by mouth 3 (three) times daily as needed for muscle spasms. 09/08/15   Charm RingsErin J Honig, MD  ibuprofen  (ADVIL,MOTRIN) 600 MG tablet Take 1 tablet (600 mg total) by mouth every 6 (six) hours as needed. 03/31/16   Tatyana Kirichenko, PA-C  metroNIDAZOLE (FLAGYL) 500 MG tablet Take 1 tablet (500 mg total) by mouth 2 (two) times daily. 10/05/15   Marny LowensteinJulie N Wenzel, PA-C  traMADol (ULTRAM) 50 MG tablet Take 1 tablet (50 mg total) by mouth every 6 (six) hours as needed. 09/08/15   Charm RingsErin J Honig, MD    Family History Family History  Problem Relation Age of Onset  . Hypertension Other   . Diabetes Paternal Grandmother   . Hypertension Paternal Grandmother     Social History Social History  Substance Use Topics  . Smoking status: Never Smoker  . Smokeless tobacco: Never Used  . Alcohol use No     Allergies   Patient has no known allergies.   Review of Systems Review of Systems  Constitutional: Negative for chills and fever.  HENT: Negative for trouble swallowing.   Respiratory: Negative for shortness of breath.   Gastrointestinal: Negative for nausea and vomiting.  Skin:       Abscess to posterior right ear lobe   Physical Exam Updated Vital Signs BP 108/65 (BP Location: Right Arm)   Pulse 88   Temp 98.5 F (36.9 C) (Oral)   Resp 18   Wt 145 lb (  65.8 kg)   SpO2 99%   BMI 28.32 kg/m   Physical Exam  Constitutional: She is oriented to person, place, and time. She appears well-developed and well-nourished.  HENT:  Head: Normocephalic and atraumatic.  1 x 1 cm fluctuant abscess to the posterior right ear. No erythema. Tender to palpation.  Neck: Normal range of motion.  Cardiovascular: Normal rate.   Pulmonary/Chest: Effort normal.  Musculoskeletal: Normal range of motion.  Neurological: She is alert and oriented to person, place, and time.  Skin: Skin is warm and dry.  Psychiatric: She has a normal mood and affect. Her behavior is normal.  Nursing note and vitals reviewed.  ED Treatments / Results  DIAGNOSTIC STUDIES: Oxygen Saturation is 99% on RA, normal by my  interpretation.   COORDINATION OF CARE: 10:12 AM- Will I & D abscess. Pt verbalizes understanding and agrees to plan.  INCISION AND DRAINAGE PROCEDURE NOTE: Patient identification was confirmed and verbal consent was obtained. This procedure was performed by Audry Pili, PA-C at 10:17 AM. Site: posterior right ear Sterile procedures observed Needle size: 25 G Anesthetic used (type and amt): Lidocaine 2% without Epinephrine (1 mL) and Pain Ease cold spray Blade size: 11 Drainage: purulent Complexity: Complex Packing used: none Site anesthetized, incision made over site, wound drained and explored loculations, rinsed with copious amounts of normal saline, covered with dry, sterile dressing. Pt tolerated procedure well without complications. Instructions for care discussed verbally and pt provided with additional written instructions for homecare and f/u.   Medications  lidocaine (XYLOCAINE) 2 % (with pres) injection (not administered)    Labs (all labs ordered are listed, but only abnormal results are displayed) Labs Reviewed - No data to display  EKG  EKG Interpretation None       Radiology No results found.  Procedures Procedures (including critical care time)  Medications Ordered in ED Medications  lidocaine (XYLOCAINE) 2 % (with pres) injection (not administered)     Initial Impression / Assessment and Plan / ED Course  I have reviewed the triage vital signs and the nursing notes.  Pertinent labs & imaging results that were available during my care of the patient were reviewed by me and considered in my medical decision making (see chart for details).  Final Clinical Impressions(s) / ED Diagnoses     {I have reviewed the relevant previous healthcare records.  {I obtained HPI from historian.   ED Course:  Assessment: Pt is a 25yF who presents to the Emergency Department with skin abscess amenable to incision and drainage. No signs of cellulitis surrounding  skin. Given Rx Bactrim, Will d/c to home.  Disposition/Plan:  DC Home Additional Verbal discharge instructions given and discussed with patient.  Pt Instructed to f/u with PCP in the next week for evaluation and treatment of symptoms. Return precautions given Pt acknowledges and agrees with plan  Supervising Physician Charlynne Pander, MD  Final diagnoses:  Abscess of right external ear    New Prescriptions New Prescriptions   No medications on file     Audry Pili, PA-C 05/29/16 1029    Charlynne Pander, MD 05/29/16 [phone]

## 2016-05-29 NOTE — Discharge Instructions (Signed)
Please read and follow all provided instructions.  Your diagnoses today include:  1. Abscess of right external ear    Tests performed today include: Vital signs. See below for your results today.   Medications prescribed:   Take any prescribed medications only as directed.   Home care instructions:  Follow any educational materials contained in this packet  Follow-up instructions: Please follow-up with your primary care provider in the next 1 week for further evaluation of your symptoms.   Return instructions:  Return to the Emergency Department if you have: Fever Worsening symptoms Worsening pain Worsening swelling Redness of the skin that moves away from the affected area, especially if it streaks away from the affected area  Any other emergent concerns  Additional Information: If you have recurrent abscesses, try both the following. Use a Qtip to apply an over-the-counter antibiotic to the inside of your nostrils, twice a day for 5 days. Wash your body with over-the-counter Hibaclens once a day for one week and then once every two weeks. This can reduce the amount of bacterial on your skin that causes boils and lead to fewer boils. If you continue to have multiple or recurrent boils, you should see a dermatologist (skin doctor).   Your vital signs today were: BP 108/65 (BP Location: Right Arm)    Pulse 88    Temp 98.5 F (36.9 C) (Oral)    Resp 18    Wt 65.8 kg    SpO2 99%    BMI 28.32 kg/m  If your blood pressure (BP) was elevated above 135/85 this visit, please have this repeated by your doctor within one month. --------------

## 2016-05-29 NOTE — ED Triage Notes (Signed)
Pt reports 5 day hx of swelling and tenderness behind r/ear.tyated that she has been treated for this same concern in the ED

## 2016-09-19 ENCOUNTER — Encounter (HOSPITAL_COMMUNITY): Payer: Self-pay | Admitting: Emergency Medicine

## 2016-09-19 ENCOUNTER — Emergency Department (HOSPITAL_COMMUNITY)
Admission: EM | Admit: 2016-09-19 | Discharge: 2016-09-19 | Disposition: A | Discharge status: Home / Self Care | Payer: Self-pay | Attending: Emergency Medicine | Admitting: Emergency Medicine

## 2016-09-19 DIAGNOSIS — H6001 Abscess of right external ear: Secondary | ICD-10-CM | POA: Insufficient documentation

## 2016-09-19 DIAGNOSIS — Z79899 Other long term (current) drug therapy: Secondary | ICD-10-CM | POA: Insufficient documentation

## 2016-09-19 MED ORDER — SULFAMETHOXAZOLE-TRIMETHOPRIM 800-160 MG PO TABS: 1.0000 | ORAL_TABLET | Freq: Two times a day (BID) | ORAL | 0 refills | Status: AC

## 2016-09-19 MED ORDER — LIDOCAINE HCL (PF) 1 % IJ SOLN
10.0000 mL | Freq: Once | INTRAMUSCULAR | Status: AC
  Administered 2016-09-19: 10 mL
  Filled 2016-09-19: qty 10

## 2016-09-19 MED ORDER — HYDROCODONE-ACETAMINOPHEN 5-325 MG PO TABS: 1.0000 | ORAL_TABLET | Freq: Four times a day (QID) | ORAL | 0 refills | Status: DC | PRN

## 2016-09-19 NOTE — ED Provider Notes (Signed)
[facility]-EMERGENCY DEPT Provider Note   CSN: [id number] Arrival date & time: 09/19/16  1832  By signing my name below, I, Modena Jansky, attest that this documentation has been prepared under the direction and in the presence of non-physician practitioner, Lyndel Safe, PA-C. Electronically Signed: Modena Jansky, Scribe. 09/19/2016. 8:14 PM.  History   Chief Complaint Chief Complaint  Patient presents with  . Ear Pain   The history is provided by the patient. No language interpreter was used.   HPI Comments: Sara Higgins is a 26 y.o. female who presents to the Emergency Department complaining of constant right posterior ear bump that started 3 days ago. She states her bump feels similar to prior abscesses in the same spot. She reports associated pain that has been worsening. Denies any fever, chills, or other complaints at this time.   PCP: None  Past Medical History:  Diagnosis Date  . Acid reflux   . History of chlamydia 07/2014  . History of trichomoniasis 07/2014  . Vaginal Pap smear, abnormal     Patient Active Problem List   Diagnosis Date Noted  . Pap smear with atypical squamous cells, cannot exclude high grade squamous intraepithelial lesion (ASC-H) 08/26/2015    Past Surgical History:  Procedure Laterality Date  . NO PAST SURGERIES    . WISDOM TOOTH EXTRACTION      OB History    Gravida Para Term Preterm AB Living   1 1 1     1    SAB TAB Ectopic Multiple Live Births                   Home Medications    Prior to Admission medications   Medication Sig Start Date End Date Taking? Authorizing Provider  cyclobenzaprine (FLEXERIL) 5 MG tablet Take 1 tablet (5 mg total) by mouth 3 (three) times daily as needed for muscle spasms. 09/08/15   Charm Rings, MD  HYDROcodone-acetaminophen (NORCO/VICODIN) 5-325 MG tablet Take 1 tablet by mouth every 6 (six) hours as needed for severe pain. 09/19/16   Cristina Gong, PA-C  ibuprofen (ADVIL,MOTRIN) 600 MG  tablet Take 1 tablet (600 mg total) by mouth every 6 (six) hours as needed. 03/31/16   Kirichenko, Tatyana, PA-C  metroNIDAZOLE (FLAGYL) 500 MG tablet Take 1 tablet (500 mg total) by mouth 2 (two) times daily. 10/05/15   Marny Lowenstein, PA-C  sulfamethoxazole-trimethoprim (BACTRIM DS,SEPTRA DS) 800-160 MG tablet Take 1 tablet by mouth 2 (two) times daily. 09/19/16 09/26/16  Cristina Gong, PA-C  traMADol (ULTRAM) 50 MG tablet Take 1 tablet (50 mg total) by mouth every 6 (six) hours as needed. 09/08/15   Charm Rings, MD    Family History Family History  Problem Relation Age of Onset  . Hypertension Other   . Diabetes Paternal Grandmother   . Hypertension Paternal Grandmother     Social History Social History  Substance Use Topics  . Smoking status: Never Smoker  . Smokeless tobacco: Never Used  . Alcohol use No     Allergies   Patient has no known allergies.   Review of Systems Review of Systems  Constitutional: Negative for chills and fever.  HENT: Negative for dental problem, ear discharge, hearing loss, mouth sores, rhinorrhea, sinus pain, sinus pressure and tinnitus.   Skin:       Swelling that is tender to palpation to right posterior ear.     Physical Exam Updated Vital Signs BP 111/80 (BP Location: Right Arm)  Pulse 62   Temp 98.5 F (36.9 C) (Oral)   Resp 16   LMP 09/18/2016   SpO2 100%   Physical Exam  Constitutional: She appears well-developed and well-nourished. No distress.  HENT:  Head: Normocephalic and atraumatic.  Right Ear: Hearing, tympanic membrane and ear canal normal. No mastoid tenderness. No hemotympanum.  Left Ear: Hearing, tympanic membrane, external ear and ear canal normal.  Mouth/Throat: Oropharynx is clear and moist.  Neck: Normal range of motion. Neck supple. No tracheal deviation present.  Cardiovascular: Normal rate.   Pulmonary/Chest: Effort normal. No stridor. No respiratory distress.  Lymphadenopathy:    She has cervical  adenopathy (inferior to abscess).  Skin: Skin is warm and dry.  Obvious swelling with fluctuance and erythema to posterior aspect of ear/ear lobe.  Does not appear to extend to mastoid.  No mastoid tenderness.    Psychiatric: Her behavior is normal.  Nursing note and vitals reviewed.    ED Treatments / Results  DIAGNOSTIC STUDIES: Oxygen Saturation is 100% on RA, normal by my interpretation.    COORDINATION OF CARE: 8:18 PM- Pt advised of plan for treatment and pt agrees.  Labs (all labs ordered are listed, but only abnormal results are displayed) Labs Reviewed - No data to display  EKG  EKG Interpretation None       Radiology No results found.  Procedures .Marland KitchenIncision and Drainage Date/Time: 09/19/2016 9:25 PM Performed by: Cristina Gong Authorized by: Cristina Gong   Consent:    Consent obtained:  Verbal   Consent given by:  Patient   Risks discussed:  Bleeding, damage to other organs, incomplete drainage, pain and infection   Alternatives discussed:  No treatment, alternative treatment and referral Location:    Type:  Abscess   Size:  2 cm   Location:  Head   Head location:  R external ear Pre-procedure details:    Skin preparation:  Chloraprep Anesthesia (see MAR for exact dosages):    Anesthesia method:  Local infiltration   Local anesthetic:  Lidocaine 1% w/o epi Procedure type:    Complexity:  Simple Procedure details:    Incision types:  Stab incision   Incision depth:  Dermal   Scalpel blade:  10   Wound management:  Irrigated with saline (Irrigated using a large gauge catheter and extensive amounts of saline )   Drainage:  Bloody and purulent   Drainage amount:  Scant   Wound treatment:  Wound left open   Packing materials:  None Post-procedure details:    Patient tolerance of procedure:  Tolerated well, no immediate complications     Medications Ordered in ED Medications  lidocaine (PF) (XYLOCAINE) 1 % injection 10 mL (10 mLs  Infiltration Given 09/19/16 2054)     Initial Impression / Assessment and Plan / ED Course  I have reviewed the triage vital signs and the nursing notes.  Pertinent labs & imaging results that were available during my care of the patient were reviewed by me and considered in my medical decision making (see chart for details).      Patient with skin abscess amenable to incision and drainage.  Abscess was not large enough to warrant packing or drain,  wound recheck in 2 days. Encouraged home warm soaks and flushing.  Mild signs of cellulitis is surrounding skin.  Will d/c to home with pain medication and Bactrim based on location of abscess and recurrent nature.  Patient has had abscess drained in the same location multiple  times before, will refer to ENT for additional evaluation.  Patient was given the option to ask questions, all of which were answered to the best of my ability.   At this time there does not appear to be any evidence of an acute emergency medical condition and the patient appears stable for discharge with appropriate outpatient follow up.Diagnosis was discussed with patient who verbalizes understanding and is agreeable to discharge. Pt case discussed with Dr. Rosalia Hammersay who agrees with my plan.    Final Clinical Impressions(s) / ED Diagnoses   Final diagnoses:  Abscess of right earlobe    New Prescriptions Discharge Medication List as of 09/19/2016  9:28 PM    START taking these medications   Details  HYDROcodone-acetaminophen (NORCO/VICODIN) 5-325 MG tablet Take 1 tablet by mouth every 6 (six) hours as needed for severe pain., Starting Sun 09/19/2016, Print    sulfamethoxazole-trimethoprim (BACTRIM DS,SEPTRA DS) 800-160 MG tablet Take 1 tablet by mouth 2 (two) times daily., Starting Sun 09/19/2016, Until Sun 09/26/2016, Print       I personally performed the services described in this documentation, which was scribed in my presence. The recorded information has been reviewed  and is accurate.     Cristina GongHammond, Blu Mcglaun W, PA-C 09/20/16 [id number]    Margarita Grizzleay, Danielle, MD 09/29/16 [phone]

## 2016-09-19 NOTE — ED Triage Notes (Signed)
Pt to ER for 3 day hx of abscess to posterior right ear. Denies drainage. Reports hx of same.

## 2016-09-19 NOTE — Discharge Instructions (Signed)
Please take Ibuprofen (Advil, motrin) and Tylenol (acetaminophen) to relieve your pain.  You may take up to 800 MG (4 pills) of normal strength ibuprofen every 8 hours as needed.  In between doses of ibuprofen you make take tylenol, up to 1,000 mg (two extra strength pills).  Do not take more than 3,000 mg tylenol in a 24 hour period.  Please check all medication labels as many medications such as pain and cold medications may contain tylenol.  Do not drink while taking these medications.  Do not take other NSAID'S while taking ibuprofen (such as aleve or naproxen).  Your prescription pain medication contains Tylenol which you need to count and your daily allowed dose of Tylenol. Please do not drink alcohol while consuming these medications.  Please take ibuprofen with food to decrease stomach upset.  Today you received medications that may make you sleepy or impair your ability to make decisions.    You are being prescribed a medication which may make you sleepy. Please do not drive, operate heavy machinery, care for a small child with out another adult present, or perform any activities that may cause harm to you or someone else if you were to fall asleep or be impaired for 24 hours after one dose.

## 2017-02-15 ENCOUNTER — Encounter (HOSPITAL_COMMUNITY): Payer: Self-pay

## 2017-02-15 ENCOUNTER — Ambulatory Visit (HOSPITAL_COMMUNITY)
Admission: RE | Admit: 2017-02-15 | Discharge: 2017-02-15 | Disposition: A | Discharge status: Home / Self Care | Payer: Self-pay | Source: Ambulatory Visit | Attending: Obstetrics and Gynecology | Admitting: Obstetrics and Gynecology

## 2017-02-15 VITALS — BP 110/70 | Temp 98.6°F | Ht 61.0 in | Wt 147.0 lb

## 2017-02-15 DIAGNOSIS — Z01419 Encounter for gynecological examination (general) (routine) without abnormal findings: Secondary | ICD-10-CM

## 2017-02-15 DIAGNOSIS — N898 Other specified noninflammatory disorders of vagina: Secondary | ICD-10-CM

## 2017-02-15 NOTE — Progress Notes (Signed)
No complaints today.   Pap Smear: Pap smear completed today. [facility] with positive HPV. Patient had a colposcopy completed 08/25/2015 that was benign with HPV. Per patient has a history of an abnormal Pap smear around September 2016 that a colposcopy was completed and a LEEP was recommended for follow up. Patient did not follow up and have the LEEP completed. Last Pap smear and colposcopy result is in EPIC.  Physical exam: Breasts Breasts symmetrical. No skin abnormalities bilateral breasts. No nipple retraction bilateral breasts. No nipple discharge bilateral breasts. No lymphadenopathy. No lumps palpated bilateral breasts. No complaints of pain or tenderness on exam. Screening mammogram recommended at age 26 unless clinically indicated prior.  Pelvic/Bimanual   Ext Genitalia No lesions, no swelling and no discharge observed on external genitalia.         Vagina Vagina pink and normal texture. No lesions and thick white yeast appearing discharge observed in vagina. Wet prep completed.         Cervix Cervix is present. Cervix pink and of normal texture. Thick white yeast appearing discharge observed on cervix.    Uterus Uterus is present and palpable. Uterus in normal position and normal size.        Adnexae Bilateral ovaries present and palpable. No tenderness on palpation.          Rectovaginal No rectal exam completed today since patient had no rectal complaints. No skin abnormalities observed on exam.    Smoking History: Patient has never smoked.  Patient Navigation: Patient education provided. [facility].

## 2017-02-15 NOTE — Patient Instructions (Signed)
Explained breast self awareness with Sara Higgins. Let patient know that if today's Pap smear is normal that her next Pap smear will be due in one year due to her history of abnormal Pap smears. Let patient know will follow up with her within the next couple weeks with results of Pap smear and wet prep by phone. Informed patient that she will need a screening mammogram at age 26 unless clinically indicated prior. Sara Higgins verbalized understanding.  Domanick Cuccia, Kathaleen Maserhristine Poll, RN 2:55 PM

## 2017-02-16 ENCOUNTER — Encounter (HOSPITAL_COMMUNITY): Payer: Self-pay | Admitting: *Deleted

## 2017-02-18 LAB — CYTOLOGY - PAP
BACTERIAL VAGINITIS: NEGATIVE
CANDIDA VAGINITIS: NEGATIVE
DIAGNOSIS: NEGATIVE
HPV (WINDOPATH): NOT DETECTED
TRICH (WINDOWPATH): NEGATIVE

## 2017-02-28 ENCOUNTER — Telehealth (HOSPITAL_COMMUNITY): Payer: Self-pay | Admitting: *Deleted

## 2017-02-28 NOTE — Telephone Encounter (Signed)
Telephoned patient at home number and advised patient of negative pap smear results. HPV was negative. Next pap smear due in one year. Advised patient wet prep results were negative. Patient voice understanding.

## 2017-09-28 ENCOUNTER — Emergency Department (HOSPITAL_COMMUNITY)
Admission: EM | Admit: 2017-09-28 | Discharge: 2017-09-28 | Discharge status: Against Medical Advice | Payer: Self-pay | Attending: Emergency Medicine | Admitting: Emergency Medicine

## 2017-09-28 ENCOUNTER — Other Ambulatory Visit: Payer: Self-pay

## 2017-09-28 ENCOUNTER — Encounter (HOSPITAL_COMMUNITY): Payer: Self-pay

## 2017-09-28 DIAGNOSIS — R5383 Other fatigue: Secondary | ICD-10-CM | POA: Insufficient documentation

## 2017-09-28 LAB — CBC
HEMATOCRIT: 40.7 % (ref 36.0–46.0)
Hemoglobin: 12.8 g/dL (ref 12.0–15.0)
MCH: 26.4 pg (ref 26.0–34.0)
MCHC: 31.4 g/dL (ref 30.0–36.0)
MCV: 83.9 fL (ref 78.0–100.0)
PLATELETS: 271 10*3/uL (ref 150–400)
RBC: 4.85 MIL/uL (ref 3.87–5.11)
RDW: 13.4 % (ref 11.5–15.5)
WBC: 5.9 10*3/uL (ref 4.0–10.5)

## 2017-09-28 LAB — BASIC METABOLIC PANEL
Anion gap: 4 — ABNORMAL LOW (ref 5–15)
BUN: 11 mg/dL (ref 6–20)
CALCIUM: 8.9 mg/dL (ref 8.9–10.3)
CHLORIDE: 110 mmol/L (ref 98–111)
CO2: 26 mmol/L (ref 22–32)
CREATININE: 0.83 mg/dL (ref 0.44–1.00)
GFR calc non Af Amer: >60 mL/min (ref >60)
GLUCOSE: 81 mg/dL (ref 70–99)
Potassium: 3.6 mmol/L (ref 3.5–5.1)
Sodium: 140 mmol/L (ref 135–145)

## 2017-09-28 LAB — URINALYSIS, ROUTINE W REFLEX MICROSCOPIC
Bilirubin Urine: NEGATIVE
GLUCOSE, UA: NEGATIVE mg/dL
KETONES UR: NEGATIVE mg/dL
Nitrite: NEGATIVE
PH: 6 (ref 5.0–8.0)
Protein, ur: NEGATIVE mg/dL
Specific Gravity, Urine: 1.012 (ref 1.005–1.030)

## 2017-09-28 LAB — I-STAT BETA HCG BLOOD, ED (MC, WL, AP ONLY): I-stat hCG, quantitative: <5 m[IU]/mL (ref <5)

## 2017-09-28 LAB — TSH: TSH: 0.462 u[IU]/mL (ref 0.350–4.500)

## 2017-09-28 NOTE — ED Provider Notes (Signed)
[facility]-EMERGENCY DEPT Provider Note   CSN: [id number] Arrival date & time: 09/28/17  1709     History   Chief Complaint Chief Complaint  Patient presents with  . Weakness  . Facial Swelling  . Foot Swelling    HPI Sara Higgins is a 27 y.o. female.  HPI Patient presents with generalized weakness for last couple weeks.  States she just feels all over.  Has had occasional "reflux".  States she feels as if she has some swelling in her feet and above her eyes.  States she has been more tired all over.  States she is put on some weight over the last couple months.  No abdominal pain.  No blood in stool or black stool. Past Medical History:  Diagnosis Date  . Acid reflux   . History of chlamydia 07/2014  . History of trichomoniasis 07/2014  . Vaginal Pap smear, abnormal     Patient Active Problem List   Diagnosis Date Noted  . Pap smear with atypical squamous cells, cannot exclude high grade squamous intraepithelial lesion (ASC-H) 08/26/2015    Past Surgical History:  Procedure Laterality Date  . NO PAST SURGERIES    . WISDOM TOOTH EXTRACTION       OB History    Gravida  1   Para  1   Term  1   Preterm      AB      Living  1     SAB      TAB      Ectopic      Multiple      Live Births               Home Medications    Prior to Admission medications   Medication Sig Start Date End Date Taking? Authorizing Provider  cyclobenzaprine (FLEXERIL) 5 MG tablet Take 1 tablet (5 mg total) by mouth 3 (three) times daily as needed for muscle spasms. Patient not taking: Reported on 02/15/2017 09/08/15   Charm RingsHonig, Erin J, MD  HYDROcodone-acetaminophen (NORCO/VICODIN) 5-325 MG tablet Take 1 tablet by mouth every 6 (six) hours as needed for severe pain. Patient not taking: Reported on 02/15/2017 09/19/16   Cristina GongHammond, Elizabeth W, PA-C  ibuprofen (ADVIL,MOTRIN) 600 MG tablet Take 1 tablet (600 mg total) by mouth every 6 (six) hours as  needed. Patient not taking: Reported on 02/15/2017 03/31/16   Jaynie CrumbleKirichenko, Tatyana, PA-C  metroNIDAZOLE (FLAGYL) 500 MG tablet Take 1 tablet (500 mg total) by mouth 2 (two) times daily. Patient not taking: Reported on 02/15/2017 10/05/15   Marny LowensteinWenzel, Julie N, PA-C  traMADol (ULTRAM) 50 MG tablet Take 1 tablet (50 mg total) by mouth every 6 (six) hours as needed. Patient not taking: Reported on 02/15/2017 09/08/15   Charm RingsHonig, Erin J, MD    Family History Family History  Problem Relation Age of Onset  . Hypertension Other   . Diabetes Paternal Grandmother   . Hypertension Paternal Grandmother   . Cancer Paternal Grandmother        Ovarian    Social History Social History   Tobacco Use  . Smoking status: Never Smoker  . Smokeless tobacco: Never Used  Substance Use Topics  . Alcohol use: No  . Drug use: No     Allergies   Patient has no known allergies.   Review of Systems Review of Systems  Constitutional: Positive for unexpected weight change. Negative for appetite change.  HENT: Negative for congestion.  Respiratory: Positive for shortness of breath.   Cardiovascular: Negative for chest pain.  Gastrointestinal: Negative for abdominal pain.  Genitourinary: Negative for flank pain.  Musculoskeletal: Negative for back pain.  Skin: Negative for pallor.  Neurological: Negative for seizures.  Hematological: Negative for adenopathy.  Psychiatric/Behavioral: Negative for confusion.     Physical Exam Updated Vital Signs BP 114/78 (BP Location: Left Arm)   Pulse 84   Temp 98.6 F (37 C) (Oral)   Resp 16   Ht 5\' 1"  (1.549 m)   Wt 63.5 kg (140 lb)   LMP 08/29/2017   SpO2 100%   BMI 26.45 kg/m   Physical Exam  Constitutional: She appears well-developed.  HENT:  Head: Atraumatic.  Eyes: Pupils are equal, round, and reactive to light.  Neck: Neck supple.  Cardiovascular: Normal rate.  Pulmonary/Chest: Effort normal.  Abdominal: Soft. There is no tenderness.    Musculoskeletal: She exhibits no edema.  Neurological: She is alert.  Skin: Skin is warm. Capillary refill takes less than 2 seconds.     ED Treatments / Results  Labs (all labs ordered are listed, but only abnormal results are displayed) Labs Reviewed  BASIC METABOLIC PANEL - Abnormal; Notable for the following components:      Result Value   Anion gap 4 (*)    All other components within normal limits  CBC  URINALYSIS, ROUTINE W REFLEX MICROSCOPIC  TSH  CBG MONITORING, ED  I-STAT BETA HCG BLOOD, ED ([facility], [facility], [facility] ONLY)    EKG None  Radiology No results found.  Procedures Procedures (including critical care time)  Medications Ordered in ED Medications - No data to display   Initial Impression / Assessment and Plan / ED Course  I have reviewed the triage vital signs and the nursing notes.  Pertinent labs & imaging results that were available during my care of the patient were reviewed by me and considered in my medical decision making (see chart for details).     Patient presented with fatigue and worry about facial swelling.  He had some puffiness above her eyes but clinically not very much.  No edema on her feet.  Exam is overall reassuring.  Discussed with patient and weight gain.  About potential causes of her fatigue TSH was going to be sent.  Discussed potentially using Prilosec for occasional reflux.  Was told she would need primary care follow-up.  I was giving her the instructions and had answer my phone.  When I came back in the room she had left.  I do not believe the TSH has been drawn but may be was added onto previous labs. Final Clinical Impressions(s) / ED Diagnoses   Final diagnoses:  Fatigue, unspecified type    ED Discharge Orders    None       Benjiman Core, MD 09/28/17 2210

## 2017-09-28 NOTE — ED Notes (Signed)
Pt came out of the room angry and upset. Pt reports she is "tired of waiting and never coming here again" Pt reports she has been told her blood work came back normal and she is leaving and going home

## 2017-09-28 NOTE — ED Triage Notes (Signed)
Patient c/o weakness x 2 weeks. Patient c/o bilateral eye swelling and swelling of her feet today. Patient states, "I just feel like I need an extra breath."

## 2017-10-22 ENCOUNTER — Encounter (HOSPITAL_COMMUNITY): Payer: Self-pay | Admitting: Student

## 2017-10-22 ENCOUNTER — Inpatient Hospital Stay (HOSPITAL_COMMUNITY)
Admission: AD | Admit: 2017-10-22 | Discharge: 2017-10-22 | Disposition: A | Discharge status: Home / Self Care | Payer: Medicaid Other | Source: Ambulatory Visit | Attending: Obstetrics and Gynecology | Admitting: Obstetrics and Gynecology

## 2017-10-22 DIAGNOSIS — N760 Acute vaginitis: Secondary | ICD-10-CM

## 2017-10-22 DIAGNOSIS — B9689 Other specified bacterial agents as the cause of diseases classified elsewhere: Secondary | ICD-10-CM

## 2017-10-22 LAB — WET PREP, GENITAL
SPERM: NONE SEEN
TRICH WET PREP: NONE SEEN
Yeast Wet Prep HPF POC: NONE SEEN

## 2017-10-22 MED ORDER — METRONIDAZOLE 500 MG PO TABS
500.0000 mg | ORAL_TABLET | Freq: Two times a day (BID) | ORAL | 0 refills | Status: DC
Start: 2017-10-22 — End: 2018-01-09

## 2017-10-22 MED ORDER — FLUCONAZOLE 150 MG PO TABS: 150.0000 mg | ORAL_TABLET | Freq: Every day | ORAL | 1 refills | Status: DC

## 2017-10-22 NOTE — MAU Provider Note (Signed)
Chief Complaint: Vaginal Discharge   First Provider Initiated Contact with Patient 10/22/17 2221     SUBJECTIVE HPI: Sara Higgins is a 27 y.o. G1P1001 at Unknown who presents to Maternity Admissions reporting vaginal discharge. Complaining of vaginal discharge x 4 days. Discharge is gray and foul smelling. Denies abdominal pain, vaginal bleeding, dyspareunia, postcoital bleeding, or vaginal sores.    Past Medical History:  Diagnosis Date  . Acid reflux   . History of chlamydia 07/2014  . History of trichomoniasis 07/2014  . Vaginal Pap smear, abnormal    OB History  Gravida Para Term Preterm AB Living  1 1 1     1   SAB TAB Ectopic Multiple Live Births               # Outcome Date GA Lbr Len/2nd Weight Sex Delivery Anes PTL Lv  1 Term      Vag-Spont      Past Surgical History:  Procedure Laterality Date  . WISDOM TOOTH EXTRACTION     Social History   Socioeconomic History  . Marital status: Single    Spouse name: Not on file  . Number of children: Not on file  . Years of education: Not on file  . Highest education level: Not on file  Occupational History  . Not on file  Social Needs  . Financial resource strain: Not on file  . Food insecurity:    Worry: Not on file    Inability: Not on file  . Transportation needs:    Medical: Not on file    Non-medical: Not on file  Tobacco Use  . Smoking status: Never Smoker  . Smokeless tobacco: Never Used  Substance and Sexual Activity  . Alcohol use: No  . Drug use: No  . Sexual activity: Not Currently    Birth control/protection: Pill, None  Lifestyle  . Physical activity:    Days per week: 5 days    Minutes per session: 30 min  . Stress: To some extent  Relationships  . Social connections:    Talks on phone: More than three times a week    Gets together: More than three times a week    Attends religious service: More than 4 times per year    Active member of club or organization: No    Attends meetings of  clubs or organizations: Never    Relationship status: Never married  . Intimate partner violence:    Fear of current or ex partner: No    Emotionally abused: No    Physically abused: No    Forced sexual activity: No  Other Topics Concern  . Not on file  Social History Narrative  . Not on file   Family History  Problem Relation Age of Onset  . Hypertension Other   . Diabetes Paternal Grandmother   . Hypertension Paternal Grandmother   . Cancer Paternal Grandmother        Ovarian   No current facility-administered medications on file prior to encounter.    No current outpatient medications on file prior to encounter.   No Known Allergies  I have reviewed patient's Past Medical Hx, Surgical Hx, Family Hx, Social Hx, medications and allergies.   Review of Systems  Constitutional: Negative.   Gastrointestinal: Negative.   Genitourinary: Positive for vaginal discharge. Negative for dyspareunia, dysuria, genital sores and vaginal bleeding.    OBJECTIVE Patient Vitals for the past 24 hrs:  BP Temp Pulse Resp Height Weight  10/22/17 2151 123/76 98.4 F (36.9 C) 97 18 5\' 1"  (1.549 m) 159 lb (72.1 kg)   Constitutional: Well-developed, well-nourished female in no acute distress.  Cardiovascular: normal rate Respiratory: normal rate and effort.  GI: Abd soft, non-tender, gravid appropriate for gestational age. Pos BS x 4 MS: Extremities nontender, no edema, normal ROM Neurologic: Alert and oriented x 4.    LAB RESULTS Results for orders placed or performed during the hospital encounter of 10/22/17 (from the past 24 hour(s))  Wet prep, genital     Status: Abnormal   Collection Time: 10/22/17 10:05 PM  Result Value Ref Range   Yeast Wet Prep HPF POC NONE SEEN NONE SEEN   Trich, Wet Prep NONE SEEN NONE SEEN   Clue Cells Wet Prep HPF POC PRESENT (A) NONE SEEN   WBC, Wet Prep HPF POC FEW (A) NONE SEEN   Sperm NONE SEEN     IMAGING No results found.  MAU COURSE Orders  Placed This Encounter  Procedures  . Wet prep, genital  . Discharge patient   Meds ordered this encounter  Medications  . metroNIDAZOLE (FLAGYL) 500 MG tablet    Sig: Take 1 tablet (500 mg total) by mouth 2 (two) times daily.    Dispense:  14 tablet    Refill:  0    Order Specific Question:   Supervising Provider    Answer:   Revillo Bing [id number]  . fluconazole (DIFLUCAN) 150 MG tablet    Sig: Take 1 tablet (150 mg total) by mouth daily.    Dispense:  1 tablet    Refill:  1    Order Specific Question:   Supervising Provider    Answer:    Bing [[id number]]    MDM Will tx for BV. GC/CT pending.  Pt requesting diflucan in case of yeast infection w/abx  ASSESSMENT 1. BV (bacterial vaginosis)     PLAN Discharge home in stable condition. No alcohol with flagyl  Allergies as of 10/22/2017   No Known Allergies     Medication List    STOP taking these medications   cyclobenzaprine 5 MG tablet Commonly known as:  FLEXERIL   HYDROcodone-acetaminophen 5-325 MG tablet Commonly known as:  NORCO/VICODIN   ibuprofen 600 MG tablet Commonly known as:  ADVIL,MOTRIN   traMADol 50 MG tablet Commonly known as:  ULTRAM     TAKE these medications   fluconazole 150 MG tablet Commonly known as:  DIFLUCAN Take 1 tablet (150 mg total) by mouth daily.   metroNIDAZOLE 500 MG tablet Commonly known as:  FLAGYL Take 1 tablet (500 mg total) by mouth 2 (two) times daily.        Judeth Horn, NP 10/23/2017  12:27 AM

## 2017-10-22 NOTE — MAU Note (Signed)
Some white vag d/c for 3-4 days with foul odor. Alittle itching but not bad. No pain .

## 2017-10-22 NOTE — MAU Note (Addendum)
Judeth HornErin Lawrence NP in Triage to discuss test results and d/c plan with pt. Pt d/c home from Triage.

## 2017-10-22 NOTE — Discharge Instructions (Signed)
Bacterial Vaginosis Bacterial vaginosis is a vaginal infection that occurs when the normal balance of bacteria in the vagina is disrupted. It results from an overgrowth of certain bacteria. This is the most common vaginal infection among women ages 15-44. Because bacterial vaginosis increases your risk for STIs (sexually transmitted infections), getting treated can help reduce your risk for chlamydia, gonorrhea, herpes, and HIV (human immunodeficiency virus). Treatment is also important for preventing complications in pregnant women, because this condition can cause an early (premature) delivery. What are the causes? This condition is caused by an increase in harmful bacteria that are normally present in small amounts in the vagina. However, the reason that the condition develops is not fully understood. What increases the risk? The following factors may make you more likely to develop this condition:  Having a new sexual partner or multiple sexual partners.  Having unprotected sex.  Douching.  Having an intrauterine device (IUD).  Smoking.  Drug and alcohol abuse.  Taking certain antibiotic medicines.  Being pregnant.  You cannot get bacterial vaginosis from toilet seats, bedding, swimming pools, or contact with objects around you. What are the signs or symptoms? Symptoms of this condition include:  Grey or white vaginal discharge. The discharge can also be watery or foamy.  A fish-like odor with discharge, especially after sexual intercourse or during menstruation.  Itching in and around the vagina.  Burning or pain with urination.  Some women with bacterial vaginosis have no signs or symptoms. How is this diagnosed? This condition is diagnosed based on:  Your medical history.  A physical exam of the vagina.  Testing a sample of vaginal fluid under a microscope to look for a large amount of bad bacteria or abnormal cells. Your health care provider may use a cotton swab  or a small wooden spatula to collect the sample.  How is this treated? This condition is treated with antibiotics. These may be given as a pill, a vaginal cream, or a medicine that is put into the vagina (suppository). If the condition comes back after treatment, a second round of antibiotics may be needed. Follow these instructions at home: Medicines  Take over-the-counter and prescription medicines only as told by your health care provider.  Take or use your antibiotic as told by your health care provider. Do not stop taking or using the antibiotic even if you start to feel better. General instructions  If you have a female sexual partner, tell her that you have a vaginal infection. She should see her health care provider and be treated if she has symptoms. If you have a female sexual partner, he does not need treatment.  During treatment: ? Avoid sexual activity until you finish treatment. ? Do not douche. ? Avoid alcohol as directed by your health care provider. ? Avoid breastfeeding as directed by your health care provider.  Drink enough water and fluids to keep your urine clear or pale yellow.  Keep the area around your vagina and rectum clean. ? Wash the area daily with warm water. ? Wipe yourself from front to back after using the toilet.  Keep all follow-up visits as told by your health care provider. This is important. How is this prevented?  Do not douche.  Wash the outside of your vagina with warm water only.  Use protection when having sex. This includes latex condoms and dental dams.  Limit how many sexual partners you have. To help prevent bacterial vaginosis, it is best to have sex with just   one partner (monogamous).  Make sure you and your sexual partner are tested for STIs.  Wear cotton or cotton-lined underwear.  Avoid wearing tight pants and pantyhose, especially during summer.  Limit the amount of alcohol that you drink.  Do not use any products that  contain nicotine or tobacco, such as cigarettes and e-cigarettes. If you need help quitting, ask your health care provider.  Do not use illegal drugs. Where to find more information:  Centers for Disease Control and Prevention: www.cdc.gov/std  American Sexual Health Association (ASHA): www.ashastd.org  [place] Department of Health and Human Services, Office on Women's Health: www.womenshealth.gov/ or https://www.womenshealth.gov/a-z-topics/bacterial-vaginosis Contact a health care provider if:  Your symptoms do not improve, even after treatment.  You have more discharge or pain when urinating.  You have a fever.  You have pain in your abdomen.  You have pain during sex.  You have vaginal bleeding between periods. Summary  Bacterial vaginosis is a vaginal infection that occurs when the normal balance of bacteria in the vagina is disrupted.  Because bacterial vaginosis increases your risk for STIs (sexually transmitted infections), getting treated can help reduce your risk for chlamydia, gonorrhea, herpes, and HIV (human immunodeficiency virus). Treatment is also important for preventing complications in pregnant women, because the condition can cause an early (premature) delivery.  This condition is treated with antibiotic medicines. These may be given as a pill, a vaginal cream, or a medicine that is put into the vagina (suppository). This information is not intended to replace advice given to you by your health care provider. Make sure you discuss any questions you have with your health care provider. Document Released: 03/22/2005 Document Revised: 07/26/2016 Document Reviewed: 12/06/2015 Elsevier Interactive Patient Education  2018 [employer] Inc.  

## 2017-10-24 LAB — GC/CHLAMYDIA PROBE AMP (~~LOC~~) NOT AT ARMC
Chlamydia: NEGATIVE
Neisseria Gonorrhea: NEGATIVE

## 2017-12-09 ENCOUNTER — Encounter (HOSPITAL_COMMUNITY): Payer: Self-pay | Admitting: *Deleted

## 2017-12-09 NOTE — Progress Notes (Signed)
Patient has family planning medicaid and is scheduled  for free screening on October 14th at 6:15pm.

## 2018-01-09 ENCOUNTER — Other Ambulatory Visit: Payer: Self-pay

## 2018-01-09 ENCOUNTER — Other Ambulatory Visit: Payer: Self-pay | Admitting: Certified Nurse Midwife

## 2018-01-09 ENCOUNTER — Inpatient Hospital Stay (HOSPITAL_COMMUNITY)
Admission: AD | Admit: 2018-01-09 | Discharge: 2018-01-09 | Disposition: A | Discharge status: Home / Self Care | Payer: Medicaid Other | Source: Ambulatory Visit | Attending: Obstetrics & Gynecology | Admitting: Obstetrics & Gynecology

## 2018-01-09 DIAGNOSIS — B9689 Other specified bacterial agents as the cause of diseases classified elsewhere: Secondary | ICD-10-CM

## 2018-01-09 DIAGNOSIS — N760 Acute vaginitis: Secondary | ICD-10-CM

## 2018-01-09 LAB — WET PREP, GENITAL
SPERM: NONE SEEN
TRICH WET PREP: NONE SEEN
YEAST WET PREP: NONE SEEN

## 2018-01-09 LAB — URINALYSIS, ROUTINE W REFLEX MICROSCOPIC
BACTERIA UA: NONE SEEN
Bilirubin Urine: NEGATIVE
Glucose, UA: NEGATIVE mg/dL
Ketones, ur: NEGATIVE mg/dL
Leukocytes, UA: NEGATIVE
Nitrite: NEGATIVE
Protein, ur: NEGATIVE mg/dL
Specific Gravity, Urine: 1.029 (ref 1.005–1.030)
pH: 5 (ref 5.0–8.0)

## 2018-01-09 LAB — POCT PREGNANCY, URINE: Preg Test, Ur: NEGATIVE

## 2018-01-09 MED ORDER — FLUCONAZOLE 150 MG PO TABS: 150.0000 mg | ORAL_TABLET | Freq: Every day | ORAL | 0 refills | Status: DC

## 2018-01-09 MED ORDER — METRONIDAZOLE 500 MG PO TABS: 500.0000 mg | ORAL_TABLET | Freq: Two times a day (BID) | ORAL | 0 refills | Status: DC

## 2018-01-09 MED ORDER — FLUCONAZOLE 150 MG PO TABS: 150.0000 mg | ORAL_TABLET | Freq: Once | ORAL | 0 refills | Status: AC

## 2018-01-09 NOTE — MAU Note (Signed)
Thinks might have a bacteria or yeast infection.  Started 3 days ago. Some itching, some odor.. Wanting to self swab

## 2018-01-09 NOTE — Discharge Instructions (Signed)

## 2018-01-09 NOTE — MAU Provider Note (Signed)
History     CSN: [id number]  Arrival date and time: 01/09/18 4098   First Provider Initiated Contact with Patient 01/09/18 [phone]      Chief Complaint  Patient presents with  . Vaginal Discharge   27 y.o. Non-prgnant female her with vaginal discharge and malodor. Sx started 3 days ago. Discharge is thick and white. No itching. New sexual partner about 3 mos ago. Does not use condoms. Hx of recurrent BV.   Past Medical History:  Diagnosis Date  . Acid reflux   . History of chlamydia 07/2014  . History of trichomoniasis 07/2014  . Vaginal Pap smear, abnormal     Past Surgical History:  Procedure Laterality Date  . WISDOM TOOTH EXTRACTION      Family History  Problem Relation Age of Onset  . Hypertension Other   . Diabetes Paternal Grandmother   . Hypertension Paternal Grandmother   . Cancer Paternal Grandmother        Ovarian    Social History   Tobacco Use  . Smoking status: Never Smoker  . Smokeless tobacco: Never Used  Substance Use Topics  . Alcohol use: No  . Drug use: No    Allergies: No Known Allergies  Medications Prior to Admission  Medication Sig Dispense Refill Last Dose  . [DISCONTINUED] fluconazole (DIFLUCAN) 150 MG tablet Take 1 tablet (150 mg total) by mouth daily. 1 tablet 1   . [DISCONTINUED] metroNIDAZOLE (FLAGYL) 500 MG tablet Take 1 tablet (500 mg total) by mouth 2 (two) times daily. 14 tablet 0     Review of Systems  Gastrointestinal: Negative for abdominal pain.  Genitourinary: Positive for vaginal discharge. Negative for pelvic pain.   Physical Exam   Blood pressure 122/76, pulse 76, temperature 98.5 F (36.9 C), resp. rate 16, last menstrual period 12/22/2017.  Physical Exam  Constitutional: She is oriented to person, place, and time. She appears well-developed and well-nourished. No distress.  HENT:  Head: Normocephalic and atraumatic.  Neck: Normal range of motion.  Cardiovascular: Normal rate.  Respiratory: Effort normal. No  respiratory distress.  Musculoskeletal: Normal range of motion.  Neurological: She is alert and oriented to person, place, and time.  Psychiatric: She has a normal mood and affect.   Results for orders placed or performed during the hospital encounter of 01/09/18 (from the past 24 hour(s))  Pregnancy, urine POC     Status: None   Collection Time: 01/09/18  8:02 AM  Result Value Ref Range   Preg Test, Ur NEGATIVE NEGATIVE  Urinalysis, Routine w reflex microscopic     Status: Abnormal   Collection Time: 01/09/18  8:05 AM  Result Value Ref Range   Color, Urine YELLOW YELLOW   APPearance HAZY (A) CLEAR   Specific Gravity, Urine 1.029 1.005 - 1.030   pH 5.0 5.0 - 8.0   Glucose, UA NEGATIVE NEGATIVE mg/dL   Hgb urine dipstick MODERATE (A) NEGATIVE   Bilirubin Urine NEGATIVE NEGATIVE   Ketones, ur NEGATIVE NEGATIVE mg/dL   Protein, ur NEGATIVE NEGATIVE mg/dL   Nitrite NEGATIVE NEGATIVE   Leukocytes, UA NEGATIVE NEGATIVE   RBC / HPF 11-20 0 - 5 RBC/hpf   WBC, UA 0-5 0 - 5 WBC/hpf   Bacteria, UA NONE SEEN NONE SEEN   Squamous Epithelial / LPF 11-20 0 - 5   Mucus PRESENT   Wet prep, genital     Status: Abnormal   Collection Time: 01/09/18  8:07 AM  Result Value Ref Range   Yeast  Wet Prep HPF POC NONE SEEN NONE SEEN   Trich, Wet Prep NONE SEEN NONE SEEN   Clue Cells Wet Prep HPF POC PRESENT (A) NONE SEEN   WBC, Wet Prep HPF POC FEW (A) NONE SEEN   Sperm NONE SEEN    MAU Course  Procedures  MDM Labs ordered and reviewed. GC pending. Will treat BV. Rx Diflucan in case yeast from abx. Recommend boric acid pv twice a week. Stable for discharge home.  Assessment and Plan   1. Bacterial vaginosis    Discharge home Follow up with PCP or GYN as needed Return to MAU for OBGYN emergencies Rx Flagyl Rx Diflucan  Allergies as of 01/09/2018   No Known Allergies     Medication List    TAKE these medications   fluconazole 150 MG tablet Commonly known as:  DIFLUCAN Take 1 tablet  (150 mg total) by mouth daily.   metroNIDAZOLE 500 MG tablet Commonly known as:  FLAGYL Take 1 tablet (500 mg total) by mouth 2 (two) times daily.      Donette Larry, CNM 01/09/2018, 9:08 AM

## 2018-01-10 LAB — GC/CHLAMYDIA PROBE AMP (~~LOC~~) NOT AT ARMC
CHLAMYDIA, DNA PROBE: NEGATIVE
Neisseria Gonorrhea: NEGATIVE

## 2018-03-06 ENCOUNTER — Other Ambulatory Visit: Payer: Self-pay | Admitting: Certified Nurse Midwife

## 2018-03-07 ENCOUNTER — Inpatient Hospital Stay (HOSPITAL_COMMUNITY)
Admission: AD | Admit: 2018-03-07 | Discharge: 2018-03-07 | Disposition: A | Discharge status: Home / Self Care | Payer: Medicaid Other | Source: Ambulatory Visit | Attending: Obstetrics and Gynecology | Admitting: Obstetrics and Gynecology

## 2018-03-07 ENCOUNTER — Encounter (HOSPITAL_COMMUNITY): Payer: Self-pay

## 2018-03-07 DIAGNOSIS — N760 Acute vaginitis: Secondary | ICD-10-CM | POA: Insufficient documentation

## 2018-03-07 DIAGNOSIS — B9689 Other specified bacterial agents as the cause of diseases classified elsewhere: Secondary | ICD-10-CM

## 2018-03-07 LAB — URINALYSIS, ROUTINE W REFLEX MICROSCOPIC
BILIRUBIN URINE: NEGATIVE
Bacteria, UA: NONE SEEN
GLUCOSE, UA: NEGATIVE mg/dL
Ketones, ur: NEGATIVE mg/dL
NITRITE: NEGATIVE
Protein, ur: NEGATIVE mg/dL
SPECIFIC GRAVITY, URINE: 1.028 (ref 1.005–1.030)
pH: 6 (ref 5.0–8.0)

## 2018-03-07 LAB — WET PREP, GENITAL
SPERM: NONE SEEN
Trich, Wet Prep: NONE SEEN
WBC, Wet Prep HPF POC: NONE SEEN
Yeast Wet Prep HPF POC: NONE SEEN

## 2018-03-07 LAB — POCT PREGNANCY, URINE: PREG TEST UR: NEGATIVE

## 2018-03-07 MED ORDER — FLUCONAZOLE 150 MG PO TABS: 150.0000 mg | ORAL_TABLET | Freq: Once | ORAL | 0 refills | Status: AC

## 2018-03-07 MED ORDER — METRONIDAZOLE 500 MG PO TABS: 500.0000 mg | ORAL_TABLET | Freq: Two times a day (BID) | ORAL | 0 refills | Status: AC

## 2018-03-07 NOTE — MAU Note (Signed)
Pt thinks she may have BV again. Wants to know if there is anything else she can take that helps prevent it in the future. No PCP.

## 2018-03-07 NOTE — MAU Provider Note (Signed)
History     CSN: [id number]  Arrival date and time: 03/07/18 [id number]   First Provider Initiated Contact with Patient 03/07/18 1021      Chief Complaint  Patient presents with  . Vaginal Discharge   Vaginal Discharge  The patient's primary symptoms include vaginal discharge. The patient's pertinent negatives include no pelvic pain. This is a new problem. The current episode started in the past 7 days. The problem occurs constantly. The problem has been unchanged. The patient is experiencing no pain. She is not pregnant. Pertinent negatives include no chills, dysuria, fever, frequency, nausea or vomiting. The vaginal discharge was malodorous and white. There has been no bleeding. Nothing aggravates the symptoms. She has tried nothing for the symptoms. She uses nothing for contraception.    OB History    Gravida  1   Para  1   Term  1   Preterm      AB      Living  1     SAB      TAB      Ectopic      Multiple      Live Births              Past Medical History:  Diagnosis Date  . Acid reflux   . History of chlamydia 07/2014  . History of trichomoniasis 07/2014  . Vaginal Pap smear, abnormal     Past Surgical History:  Procedure Laterality Date  . WISDOM TOOTH EXTRACTION      Family History  Problem Relation Age of Onset  . Hypertension Other   . Diabetes Paternal Grandmother   . Hypertension Paternal Grandmother   . Cancer Paternal Grandmother        Ovarian    Social History   Tobacco Use  . Smoking status: Never Smoker  . Smokeless tobacco: Never Used  Substance Use Topics  . Alcohol use: No  . Drug use: No    Allergies: No Known Allergies  Medications Prior to Admission  Medication Sig Dispense Refill Last Dose  . fluconazole (DIFLUCAN) 150 MG tablet Take 1 tablet (150 mg total) by mouth daily. 1 tablet 0   . metroNIDAZOLE (FLAGYL) 500 MG tablet Take 1 tablet (500 mg total) by mouth 2 (two) times daily. 14 tablet 0   . metroNIDAZOLE  (FLAGYL) 500 MG tablet Take 1 tablet (500 mg total) by mouth 2 (two) times daily. 14 tablet 0     Review of Systems  Constitutional: Negative for chills and fever.  Gastrointestinal: Negative for nausea and vomiting.  Genitourinary: Positive for vaginal discharge. Negative for dysuria, frequency, pelvic pain and vaginal bleeding.   Physical Exam   Blood pressure 116/77, pulse 83, temperature 98.4 F (36.9 C), temperature source Oral, resp. rate 16, height 5' (1.524 m), weight 74.4 kg, last menstrual period 02/18/2018, SpO2 100 %.  Physical Exam  Nursing note and vitals reviewed. Constitutional: She is oriented to person, place, and time. She appears well-developed and well-nourished. No distress.  HENT:  Head: Normocephalic.  Cardiovascular: Normal rate.  Respiratory: Effort normal.  GI: Soft. There is no tenderness.  Neurological: She is alert and oriented to person, place, and time.  Skin: Skin is warm and dry.  Psychiatric: She has a normal mood and affect.     Results for orders placed or performed during the hospital encounter of 03/07/18 (from the past 24 hour(s))  Urinalysis, Routine w reflex microscopic     Status: Abnormal  Collection Time: 03/07/18  9:14 AM  Result Value Ref Range   Color, Urine YELLOW YELLOW   APPearance HAZY (A) CLEAR   Specific Gravity, Urine 1.028 1.005 - 1.030   pH 6.0 5.0 - 8.0   Glucose, UA NEGATIVE NEGATIVE mg/dL   Hgb urine dipstick MODERATE (A) NEGATIVE   Bilirubin Urine NEGATIVE NEGATIVE   Ketones, ur NEGATIVE NEGATIVE mg/dL   Protein, ur NEGATIVE NEGATIVE mg/dL   Nitrite NEGATIVE NEGATIVE   Leukocytes, UA TRACE (A) NEGATIVE   RBC / HPF 11-20 0 - 5 RBC/hpf   WBC, UA 0-5 0 - 5 WBC/hpf   Bacteria, UA NONE SEEN NONE SEEN   Squamous Epithelial / LPF 11-20 0 - 5   Mucus PRESENT   Wet prep, genital     Status: Abnormal   Collection Time: 03/07/18  9:14 AM  Result Value Ref Range   Yeast Wet Prep HPF POC NONE SEEN NONE SEEN   Trich,  Wet Prep NONE SEEN NONE SEEN   Clue Cells Wet Prep HPF POC PRESENT (A) NONE SEEN   WBC, Wet Prep HPF POC NONE SEEN NONE SEEN   Sperm NONE SEEN   Pregnancy, urine POC     Status: None   Collection Time: 03/07/18  9:16 AM  Result Value Ref Range   Preg Test, Ur NEGATIVE NEGATIVE     MAU Course  Procedures  MDM   Assessment and Plan   1. Bacterial vaginosis    DC home Comfort measures reviewed  RX: flagyl 500mg  BID x 7 days  Return to MAU as needed FU with OB as planned  Follow-up Information    Department, [facility] Follow up.   Contact information: [address] [place] [address] [phone]            Thressa Sheller 03/07/2018, 10:21 AM

## 2018-03-07 NOTE — Discharge Instructions (Signed)
In late 2019, the [facility] will be moving to the [place] campus. At that time, the MAU (Maternity Admissions Unit), where you are being seen today, will no longer take care of non-pregnant patients. We strongly encourage you to find a doctor's office before that time, so that you can be seen with any GYN concerns, like vaginal discharge, urinary tract infection, etc.. in a timely manner. ° °In order to make an office visit more convenient, the [facility] will be offering evening hours with same-day appointments, walk-in appointments and scheduled appointments available during this time. ° °[facility] Hours: °Monday - 8am - 7:30 pm with walk-in between 4pm- 7:30 pm °Tuesday - 8 am - 7:30 pm with walk-in between 4pm- 7:30 pm °Wednesday - 8 am - 7:30 pm with walk-in between 4pm- 7:30 pm °Thursday 8 am - 7:30 pm with walk-in between 4pm- 7:30 pm °Friday 8 am - 5 pm ° °For an appointment please call the [facility] at [phone] ° °For urgent needs, [facility] is also available for management of urgent GYN complaints such as vaginal discharge or urinary tract infections. ° ° ° ° ° ° °

## 2018-03-08 LAB — GC/CHLAMYDIA PROBE AMP (~~LOC~~) NOT AT ARMC
CHLAMYDIA, DNA PROBE: NEGATIVE
Neisseria Gonorrhea: NEGATIVE

## 2018-07-11 ENCOUNTER — Other Ambulatory Visit: Payer: Self-pay

## 2018-07-11 ENCOUNTER — Telehealth: Payer: Self-pay

## 2018-07-11 ENCOUNTER — Inpatient Hospital Stay (HOSPITAL_COMMUNITY)
Admission: AD | Admit: 2018-07-11 | Discharge: 2018-07-11 | Disposition: A | Discharge status: Home / Self Care | Payer: Medicaid Other | Attending: Obstetrics & Gynecology | Admitting: Obstetrics & Gynecology

## 2018-07-11 ENCOUNTER — Inpatient Hospital Stay (HOSPITAL_COMMUNITY): Payer: Medicaid Other

## 2018-07-11 ENCOUNTER — Encounter (HOSPITAL_COMMUNITY): Payer: Self-pay | Admitting: *Deleted

## 2018-07-11 DIAGNOSIS — K219 Gastro-esophageal reflux disease without esophagitis: Secondary | ICD-10-CM | POA: Insufficient documentation

## 2018-07-11 DIAGNOSIS — O26891 Other specified pregnancy related conditions, first trimester: Secondary | ICD-10-CM

## 2018-07-11 DIAGNOSIS — Z833 Family history of diabetes mellitus: Secondary | ICD-10-CM | POA: Diagnosis not present

## 2018-07-11 DIAGNOSIS — R109 Unspecified abdominal pain: Secondary | ICD-10-CM

## 2018-07-11 DIAGNOSIS — O99611 Diseases of the digestive system complicating pregnancy, first trimester: Secondary | ICD-10-CM | POA: Diagnosis not present

## 2018-07-11 DIAGNOSIS — Z8041 Family history of malignant neoplasm of ovary: Secondary | ICD-10-CM | POA: Diagnosis not present

## 2018-07-11 DIAGNOSIS — Z3201 Encounter for pregnancy test, result positive: Secondary | ICD-10-CM

## 2018-07-11 DIAGNOSIS — O26899 Other specified pregnancy related conditions, unspecified trimester: Secondary | ICD-10-CM

## 2018-07-11 DIAGNOSIS — O9989 Other specified diseases and conditions complicating pregnancy, childbirth and the puerperium: Secondary | ICD-10-CM | POA: Diagnosis present

## 2018-07-11 DIAGNOSIS — Z3A08 8 weeks gestation of pregnancy: Secondary | ICD-10-CM

## 2018-07-11 LAB — TYPE AND SCREEN
ABO/RH(D): O POS
Antibody Screen: NEGATIVE

## 2018-07-11 LAB — WET PREP, GENITAL
Clue Cells Wet Prep HPF POC: NONE SEEN
Sperm: NONE SEEN
Trich, Wet Prep: NONE SEEN
Yeast Wet Prep HPF POC: NONE SEEN

## 2018-07-11 LAB — URINALYSIS, ROUTINE W REFLEX MICROSCOPIC
Bilirubin Urine: NEGATIVE
Glucose, UA: NEGATIVE mg/dL
Ketones, ur: NEGATIVE mg/dL
Leukocytes,Ua: NEGATIVE
Nitrite: NEGATIVE
Protein, ur: NEGATIVE mg/dL
Specific Gravity, Urine: 1.027 (ref 1.005–1.030)
pH: 6 (ref 5.0–8.0)

## 2018-07-11 LAB — CBC
HCT: 41.6 % (ref 36.0–46.0)
Hemoglobin: 12.8 g/dL (ref 12.0–15.0)
MCH: 25.2 pg — ABNORMAL LOW (ref 26.0–34.0)
MCHC: 30.8 g/dL (ref 30.0–36.0)
MCV: 81.9 fL (ref 80.0–100.0)
Platelets: 290 10*3/uL (ref 150–400)
RBC: 5.08 MIL/uL (ref 3.87–5.11)
RDW: 13.2 % (ref 11.5–15.5)
WBC: 8 10*3/uL (ref 4.0–10.5)
nRBC: 0 % (ref 0.0–0.2)

## 2018-07-11 LAB — HCG, QUANTITATIVE, PREGNANCY: hCG, Beta Chain, Quant, S: 603 m[IU]/mL — ABNORMAL HIGH (ref <5)

## 2018-07-11 LAB — POCT PREGNANCY, URINE: Preg Test, Ur: POSITIVE — AB

## 2018-07-11 MED ORDER — ACETAMINOPHEN 500 MG PO TABS: 1000.0000 mg | ORAL_TABLET | Freq: Three times a day (TID) | ORAL | 1 refills | Status: DC | PRN

## 2018-07-11 MED ORDER — PREPLUS 27-1 MG PO TABS: 1.0000 | ORAL_TABLET | Freq: Once | ORAL | 3 refills | Status: AC

## 2018-07-11 NOTE — MAU Note (Signed)
Presents with complaints of lower abdominal pain for a week. + pregnancy test April the 3rd. Denies any vaginal bleeding or abnormal discharge

## 2018-07-11 NOTE — Discharge Instructions (Signed)
Abdominal Pain During Pregnancy ° °Abdominal pain is common during pregnancy, and has many possible causes. Some causes are more serious than others, and sometimes the cause is not known. Abdominal pain can be a sign that labor is starting. It can also be caused by normal growth and stretching of muscles and ligaments during pregnancy. Always tell your health care provider if you have any abdominal pain. °Follow these instructions at home: °· Do not have sex or put anything in your vagina until your pain goes away completely. °· Get plenty of rest until your pain improves. °· Drink enough fluid to keep your urine pale yellow. °· Take over-the-counter and prescription medicines only as told by your health care provider. °· Keep all follow-up visits as told by your health care provider. This is important. °Contact a health care provider if: °· Your pain continues or gets worse after resting. °· You have lower abdominal pain that: °? Comes and goes at regular intervals. °? Spreads to your back. °? Is similar to menstrual cramps. °· You have pain or burning when you urinate. °Get help right away if: °· You have a fever or chills. °· You have vaginal bleeding. °· You are leaking fluid from your vagina. °· You are passing tissue from your vagina. °· You have vomiting or diarrhea that lasts for more than 24 hours. °· Your baby is moving less than usual. °· You feel very weak or faint. °· You have shortness of breath. °· You develop severe pain in your upper abdomen. °Summary °· Abdominal pain is common during pregnancy, and has many possible causes. °· If you experience abdominal pain during pregnancy, tell your health care provider right away. °· Follow your health care provider's home care instructions and keep all follow-up visits as directed. °This information is not intended to replace advice given to you by your health care provider. Make sure you discuss any questions you have with your health care  provider. °Document Released: 03/22/2005 Document Revised: 06/24/2016 Document Reviewed: 06/24/2016 °Elsevier Interactive Patient Education © 2019 [employer]. ° °

## 2018-07-11 NOTE — MAU Provider Note (Addendum)
History     CSN: [id number]  Arrival date and time: 07/11/18 4098   First Provider Initiated Contact with Patient 07/11/18 0845      Chief Complaint  Patient presents with  . Abdominal Pain   Sara Higgins is a 28 y.o. G2P1001 at [redacted]w[redacted]d who presents for Abdominal Pain.  She reports the pain started about one week ago and is constant in nature.  Patient reports the pain is located in her located in her lower stomach, and warm baths help "a little bit."She states "it feels like my period is coming on" and denies taking any medication as she "didn't know what to take since I am pregnant."  She denies sexual activity in the last 3 days or any vaginal concerns including discharge and odor.  Patient reports disbelief with possibility of pregnancy reporting that she has not had sex since Feb 12 and was still "kinda of my period" at that time.  She does not desire pregnancy and expresses fear about having another child.  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  She states she was instructed to use the gel twice weekly for 6 months.      OB History    Gravida  2   Para  1   Term  1   Preterm      AB      Living  1     SAB      TAB      Ectopic      Multiple      Live Births              Past Medical History:  Diagnosis Date  . Acid reflux   . History of chlamydia 07/2014  . History of trichomoniasis 07/2014  . Vaginal Pap smear, abnormal     Past Surgical History:  Procedure Laterality Date  . WISDOM TOOTH EXTRACTION      Family History  Problem Relation Age of Onset  . Hypertension Other   . Diabetes Paternal Grandmother   . Hypertension Paternal Grandmother   . Cancer Paternal Grandmother        Ovarian    Social History   Tobacco Use  . Smoking status: Never Smoker  .  Smokeless tobacco: Never Used  Substance Use Topics  . Alcohol use: No  . Drug use: No    Allergies: No Known Allergies  No medications prior to admission.    Review of Systems  Constitutional: Negative for chills and fever.  Gastrointestinal: Negative for constipation, diarrhea, nausea and vomiting.  Genitourinary: Negative for dyspareunia, dysuria, vaginal bleeding and vaginal discharge.  Neurological: Negative for dizziness, light-headedness and headaches.   Physical Exam   Blood pressure (!) 142/95, pulse (!) 123, temperature 98.3 F (36.8 C), resp. rate 16, height  (1.549 m), weight 76.2 kg, last menstrual period 05/16/2018, SpO2 97 %.  Physical Exam  Constitutional: She is oriented to person, place, and time. She appears well-developed and well-nourished. No distress.  HENT:  Head: Normocephalic and atraumatic.  Eyes: Conjunctivae are normal.  Neck: Normal range of motion.  Cardiovascular: Normal rate.  Respiratory: Effort normal.  GI: Soft.  Genitourinary: Uterus is not enlarged. Cervix exhibits no motion tenderness, no discharge and no friability.    Vaginal discharge present.  No vaginal tenderness or bleeding.  No tenderness or bleeding in the vagina.    Genitourinary Comments: Speculum Exam: -Vaginal Vault: Pink Mucosa.  Moderate amt thick white curdy discharge in vault c/w vaginal treatment-wet prep collected -Cervix:Pink, no lesions, cysts, or polyps.  Appears closed. No active bleeding from os-GC/CT collected -Bimanual Exam: Closed/Long Uterus difficult to assess d/t position, but does not feel enlarged. No tenderness   Musculoskeletal: Normal range of motion.  Neurological: She is alert and oriented to person, place, and time.  Skin: Skin is warm and dry.  Psychiatric: She has a normal mood and affect. Her behavior is normal.    MAU Course  Procedures Results for orders placed or performed during the hospital encounter of 07/11/18 (from the past  24 hour(s))  Urinalysis, Routine w reflex microscopic     Status: Abnormal   Collection Time: 07/11/18  8:07 AM  Result Value Ref Range   Color, Urine YELLOW YELLOW   APPearance CLEAR CLEAR   Specific Gravity, Urine 1.027 1.005 - 1.030   pH 6.0 5.0 - 8.0   Glucose, UA NEGATIVE NEGATIVE mg/dL   Hgb urine dipstick MODERATE (A) NEGATIVE   Bilirubin Urine NEGATIVE NEGATIVE   Ketones, ur NEGATIVE NEGATIVE mg/dL   Protein, ur NEGATIVE NEGATIVE mg/dL   Nitrite NEGATIVE NEGATIVE   Leukocytes,Ua NEGATIVE NEGATIVE   RBC / HPF 6-10 0 - 5 RBC/hpf   WBC, UA 0-5 0 - 5 WBC/hpf   Bacteria, UA RARE (A) NONE SEEN   Squamous Epithelial / LPF 0-5 0 - 5   Mucus PRESENT   Pregnancy, urine POC     Status: Abnormal   Collection Time: 07/11/18  8:14 AM  Result Value Ref Range   Preg Test, Ur POSITIVE (A) NEGATIVE  Wet prep, genital     Status: Abnormal   Collection Time: 07/11/18  9:00 AM  Result Value Ref Range   Yeast Wet Prep HPF POC NONE SEEN NONE SEEN   Trich, Wet Prep NONE SEEN NONE SEEN   Clue Cells Wet Prep HPF POC NONE SEEN NONE SEEN   WBC, Wet Prep HPF POC FEW (A) NONE SEEN   Sperm NONE SEEN    US Ob Comp Less 14 Wks  Result Date: 07/11/2018 CLINICAL DATA:  Abdominal pain EXAM: OBSTETRIC <14 WK [place] AND TRANSVAGINAL OB US TECHNIQUE: Both transabdominal and transvaginal ultrasound examinations were performed for complete evaluation of the gestation as well as the maternal uterus, adnexal regions, and pelvic cul-de-sac. Transvaginal technique was performed to assess early pregnancy. COMPARISON:  None. FINDINGS: Intrauterine gestational sac: None Yolk sac:  Not visualized Embryo:  Not visualized Cardiac Activity: Not visualized Heart Rate:   bpm MSD:   mm    w     d CRL:    mm    w    d                  [place] EDC: Subchorionic hemorrhage:  None visualized. Maternal uterus/adnexae: No adnexal mass or free fluid. Endometrium 13 mm in thickness. Small amount of fluid in the endometrial canal. IMPRESSION: No  intrauterine pregnancy visualized. Differential considerations would include early intrauterine pregnancy too early to visualize, spontaneous abortion, or occult ectopic pregnancy. Recommend close clinical followup and serial quantitative beta HCGs and ultrasounds. Electronically Signed   By: Charlett Nose M.D.   On: 07/11/2018 10:48   US Ob Transvaginal  Result Date: 07/11/2018 CLINICAL DATA:  Abdominal pain EXAM: OBSTETRIC <14 WK [place] AND  TRANSVAGINAL OB US TECHNIQUE: Both transabdominal and transvaginal ultrasound examinations were performed for complete evaluation of the gestation as well as the maternal uterus, adnexal regions, and pelvic cul-de-sac. Transvaginal technique was performed to assess early pregnancy. COMPARISON:  None. FINDINGS: Intrauterine gestational sac: None Yolk sac:  Not visualized Embryo:  Not visualized Cardiac Activity: Not visualized Heart Rate:   bpm MSD:   mm    w     d CRL:    mm    w    d                  US EDC: Subchorionic hemorrhage:  None visualized. Maternal uterus/adnexae: No adnexal mass or free fluid. Endometrium 13 mm in thickness. Small amount of fluid in the endometrial canal. IMPRESSION: No intrauterine pregnancy visualized. Differential considerations would include early intrauterine pregnancy too early to visualize, spontaneous abortion, or occult ectopic pregnancy. Recommend close clinical followup and serial quantitative beta HCGs and ultrasounds. Electronically Signed   By: Charlett NoseKevin  Dover M.D.   On: 07/11/2018 10:48     MDM Pelvic Exam with cultures Labs: UA, Wet prep, and GC/CT  Assessment and Plan  28 year old G2P1001 at 8 weeks Abdominal Pain  -Exam findings discussed. -Wet prep and GC/CT sent -Discussed need for US. -Will await results.  Follow Up (11:31 AM)  -Wet prep returns with insignificant findings. -Informed that GC/CT will return within 2-3 days. -US results return without IUP or IUG -Results discussed with patient as well as need  for further follow up. -HcG, CBC, and T&S ordered.  -Patient informed that labs would be drawn and provider will call with results and plan of care based on findings. -Patient continues to express worry about pregnancy. -Reassurances given and patient encouraged to remain calm until further information can be obtained. -Instructed to complete suppressive therapy for BV and follow up with prescribing provider as appropriate.  -Rx for Tylenol 500mg  Take 2 tablets q 8 hrs prn, Disp 30, RF 1 -Rx for PNV  -No other questions or concerns. -Discharge order placed. -Bleeding precautions given. -Encouraged to call or return to MAU if symptoms worsen or with the onset of new symptoms.  Cherre RobinsJessica L Josephina Melcher MSN, CNM 07/11/2018, 8:45 AM

## 2018-07-11 NOTE — Telephone Encounter (Signed)
Stat quant returns at 603 and patient called with results. Informed of need for follow up in 48 hours at MAU.  Patient expresses concern about when pregnancy occurred.  Informed that based on results she is approximately [redacted] weeks pregnant, but definitive EDD would not occur until after dating [place].  Patient reports that she may have conceived on March 15th as she did have sexual activity around that time.  Provider reiterated need for further follow up including repeat hCG on Thursday 07/13/2018 at 1230pm.  Patient verbalized understanding and without other questions or concerns.  Order to be placed for follow up hcG.  Cherre Robins MSN, CNM 07/11/2018 2:04 PM

## 2018-07-12 LAB — ABO/RH: ABO/RH(D): O POS

## 2018-07-12 LAB — GC/CHLAMYDIA PROBE AMP (~~LOC~~) NOT AT ARMC
Chlamydia: NEGATIVE
Neisseria Gonorrhea: NEGATIVE

## 2018-07-13 ENCOUNTER — Other Ambulatory Visit: Payer: Self-pay

## 2018-07-13 ENCOUNTER — Inpatient Hospital Stay (HOSPITAL_COMMUNITY)
Admission: AD | Admit: 2018-07-13 | Discharge: 2018-07-13 | Disposition: A | Discharge status: Home / Self Care | Payer: Medicaid Other | Attending: Obstetrics & Gynecology | Admitting: Obstetrics & Gynecology

## 2018-07-13 DIAGNOSIS — Z833 Family history of diabetes mellitus: Secondary | ICD-10-CM | POA: Insufficient documentation

## 2018-07-13 DIAGNOSIS — R109 Unspecified abdominal pain: Secondary | ICD-10-CM | POA: Diagnosis not present

## 2018-07-13 DIAGNOSIS — Z8041 Family history of malignant neoplasm of ovary: Secondary | ICD-10-CM | POA: Diagnosis not present

## 2018-07-13 DIAGNOSIS — O3680X0 Pregnancy with inconclusive fetal viability, not applicable or unspecified: Secondary | ICD-10-CM | POA: Diagnosis present

## 2018-07-13 DIAGNOSIS — O26891 Other specified pregnancy related conditions, first trimester: Secondary | ICD-10-CM

## 2018-07-13 DIAGNOSIS — O99619 Diseases of the digestive system complicating pregnancy, unspecified trimester: Secondary | ICD-10-CM | POA: Diagnosis not present

## 2018-07-13 DIAGNOSIS — K219 Gastro-esophageal reflux disease without esophagitis: Secondary | ICD-10-CM | POA: Insufficient documentation

## 2018-07-13 DIAGNOSIS — O9989 Other specified diseases and conditions complicating pregnancy, childbirth and the puerperium: Secondary | ICD-10-CM | POA: Diagnosis not present

## 2018-07-13 LAB — HCG, QUANTITATIVE, PREGNANCY: hCG, Beta Chain, Quant, S: 1718 m[IU]/mL — ABNORMAL HIGH (ref <5)

## 2018-07-13 NOTE — MAU Provider Note (Signed)
History     CSN: [id number]  Arrival date and time: 07/13/18 1232   First Provider Initiated Contact with Patient 07/13/18 1309      Chief Complaint  Patient presents with  . Follow-up   HPI Sara Higgins 28 y.o. Unknown gestation.  Is here today for a follow up quant.  Client wants to wait today for results.  Wearing a mask.   OB History    Gravida  2   Para  1   Term  1   Preterm      AB      Living  1     SAB      TAB      Ectopic      Multiple      Live Births              Past Medical History:  Diagnosis Date  . Acid reflux   . History of chlamydia 07/2014  . History of trichomoniasis 07/2014  . Vaginal Pap smear, abnormal     Past Surgical History:  Procedure Laterality Date  . WISDOM TOOTH EXTRACTION      Family History  Problem Relation Age of Onset  . Hypertension Other   . Diabetes Paternal Grandmother   . Hypertension Paternal Grandmother   . Cancer Paternal Grandmother        Ovarian    Social History   Tobacco Use  . Smoking status: Never Smoker  . Smokeless tobacco: Never Used  Substance Use Topics  . Alcohol use: No  . Drug use: No    Allergies: No Known Allergies  Medications Prior to Admission  Medication Sig Dispense Refill Last Dose  . acetaminophen (TYLENOL) 500 MG tablet Take 2 tablets (1,000 mg total) by mouth every 8 (eight) hours as needed. 30 tablet 1     Review of Systems  Constitutional: Negative for fatigue and fever.  Respiratory: Negative for cough and shortness of breath.   Gastrointestinal: Positive for abdominal pain. Negative for nausea and vomiting.  Genitourinary: Negative for dysuria, vaginal bleeding and vaginal discharge.   Physical Exam   Blood pressure 122/70, pulse 86, temperature 98.6 F (37 C), temperature source Oral, resp. rate 17, last menstrual period 05/16/2018, SpO2 100 %.  Physical Exam  Nursing note and vitals reviewed. Constitutional: She is oriented to person,  place, and time. She appears well-developed and well-nourished.  HENT:  Head: Normocephalic.  Eyes: EOM are normal.  Neck: Neck supple.  Musculoskeletal: Normal range of motion.  Neurological: She is alert and oriented to person, place, and time.  Skin: Skin is warm and dry.  Psychiatric: She has a normal mood and affect.    MAU Course  Procedures Results for orders placed or performed during the hospital encounter of 07/13/18 (from the past 24 hour(s))  hCG, quantitative, pregnancy     Status: Abnormal   Collection Time: 07/13/18  1:05 PM  Result Value Ref Range   hCG, Beta Chain, Quant, S 1,718 (H) <5 mIU/mL    MDM Talked with client and she will be awaiting call from ultrasound to be scheduled in one week for ultrasound.  No further questions.  Assessment and Plan  Pregnancy of unknown anatomic location Abdominal pain that is still the same as 2 days ago  Rising quants - more than doubled.  Plan Advised to continue pelvic rest and to return if having worse abdominal pain or vaginal bleeding. Advised ultrasound will call her and schedule an  appointment for one week.   L  07/13/2018, 1:14 PM

## 2018-07-13 NOTE — MAU Note (Signed)
Doing ok, still a little sore, mild cramps in lower abd. No bleeding.

## 2018-07-13 NOTE — Discharge Instructions (Signed)
Will schedule you for an ultrasound.  The ultrasound department will call you and get you scheduled. No smoking, no drugs, no alcohol.  Take a prenatal vitamin one by mouth every day.  Eat small frequent snacks to avoid nausea.  Begin prenatal care as soon as possible.

## 2018-07-24 ENCOUNTER — Ambulatory Visit (HOSPITAL_COMMUNITY)
Admission: RE | Admit: 2018-07-24 | Discharge: 2018-07-24 | Disposition: A | Discharge status: Home / Self Care | Payer: Medicaid Other | Source: Ambulatory Visit | Attending: Obstetrics and Gynecology | Admitting: Obstetrics and Gynecology

## 2018-07-24 ENCOUNTER — Ambulatory Visit (INDEPENDENT_AMBULATORY_CARE_PROVIDER_SITE_OTHER): Payer: Self-pay | Admitting: *Deleted

## 2018-07-24 ENCOUNTER — Other Ambulatory Visit: Payer: Self-pay

## 2018-07-24 DIAGNOSIS — O3680X0 Pregnancy with inconclusive fetal viability, not applicable or unspecified: Secondary | ICD-10-CM | POA: Insufficient documentation

## 2018-07-24 DIAGNOSIS — Z712 Person consulting for explanation of examination or test findings: Secondary | ICD-10-CM

## 2018-07-24 NOTE — Progress Notes (Signed)
Agree with A & P. 

## 2018-07-24 NOTE — Progress Notes (Signed)
Pt here for u/s results. Dr Nettie Elm reviewed u/s report. MD aware pt has occ mild pain lower abd and some spotting. Discussed with pt to report heavy vag bleeding &/or severe lower abd pain. To make appt in 5-7wks to begin prenatal care with provider of choice. Pt denies any medical problems. Pt asked if we could tell her when she conceived as she is unsure who father of baby may be. Advised only sure way to know would be paternal testing. Pt voices understanding.

## 2018-12-12 ENCOUNTER — Other Ambulatory Visit: Payer: Self-pay | Admitting: Advanced Practice Midwife

## 2019-03-16 ENCOUNTER — Other Ambulatory Visit: Payer: Self-pay

## 2019-03-16 ENCOUNTER — Encounter: Payer: Self-pay | Admitting: Emergency Medicine

## 2019-03-16 ENCOUNTER — Ambulatory Visit
Admission: EM | Admit: 2019-03-16 | Discharge: 2019-03-16 | Disposition: A | Discharge status: Home / Self Care | Payer: Medicaid Other | Attending: Physician Assistant | Admitting: Physician Assistant

## 2019-03-16 DIAGNOSIS — H6011 Cellulitis of right external ear: Secondary | ICD-10-CM

## 2019-03-16 MED ORDER — SULFAMETHOXAZOLE-TRIMETHOPRIM 800-160 MG PO TABS: 1.0000 | ORAL_TABLET | Freq: Two times a day (BID) | ORAL | 0 refills | Status: AC

## 2019-03-16 NOTE — Discharge Instructions (Signed)
No drainage came out today. However, you do have a skin infection. Start bactrim as directed. Warm compress. Follow up with PCP for further evaluation if symptoms not improving.

## 2019-03-16 NOTE — ED Notes (Signed)
Patient able to ambulate independently  

## 2019-03-16 NOTE — ED Provider Notes (Signed)
[facility]    CSN: [id number] Arrival date & time: 03/16/19  1642      History   Chief Complaint Chief Complaint  Patient presents with  . Otalgia    HPI Sara Higgins is a 28 y.o. female.   28 year old female comes in for 4-5 day history of right ear pain with possible abscess. Patient states has had recurrent abscess to the area in the past that required drainage. States area is swollen with warmth. Denies injury/trauma. Denies fever, chills, body aches. Has not tried anything for the symptoms.      Past Medical History:  Diagnosis Date  . Acid reflux   . History of chlamydia 07/2014  . History of trichomoniasis 07/2014  . Vaginal Pap smear, abnormal     Patient Active Problem List   Diagnosis Date Noted  . Pap smear with atypical squamous cells, cannot exclude high grade squamous intraepithelial lesion (ASC-H) 08/26/2015    Past Surgical History:  Procedure Laterality Date  . WISDOM TOOTH EXTRACTION      OB History    Gravida  2   Para  1   Term  1   Preterm      AB      Living  1     SAB      TAB      Ectopic      Multiple      Live Births               Home Medications    Prior to Admission medications   Medication Sig Start Date End Date Taking? Authorizing Provider  acetaminophen (TYLENOL) 500 MG tablet Take 2 tablets (1,000 mg total) by mouth every 8 (eight) hours as needed. 07/11/18   Gavin Pound, CNM  sulfamethoxazole-trimethoprim (BACTRIM DS) 800-160 MG tablet Take 1 tablet by mouth 2 (two) times daily for 7 days. 03/16/19 03/23/19  Ok Edwards, PA-C    Family History Family History  Problem Relation Age of Onset  . Hypertension Other   . Diabetes Paternal Grandmother   . Hypertension Paternal Grandmother   . Cancer Paternal Grandmother        Ovarian    Social History Social History   Tobacco Use  . Smoking status: Never Smoker  . Smokeless tobacco: Never Used  Substance Use Topics  . Alcohol  use: No  . Drug use: No     Allergies   Patient has no known allergies.   Review of Systems Review of Systems  Reason unable to perform ROS: See HPI as above.     Physical Exam Triage Vital Signs ED Triage Vitals [03/16/19 1652]  Enc Vitals Group     BP 108/74     Pulse Rate 76     Resp 18     Temp 97.9 F (36.6 C)     Temp Source Temporal     SpO2 100 %     Weight      Height      Head Circumference      Peak Flow      Pain Score 7     Pain Loc      Pain Edu?      Excl. in [place]?    No data found.  Updated Vital Signs BP 108/74 (BP Location: Left Arm)   Pulse 76   Temp 97.9 F (36.6 C) (Temporal)   Resp 18   LMP 03/12/2019   SpO2 100%  Breastfeeding Unknown   Physical Exam Constitutional:      General: She is not in acute distress.    Appearance: She is well-developed. She is not diaphoretic.  HENT:     Head: Normocephalic and atraumatic.     Ears:     Comments: Erythema and warmth to the posterior ear adjacent to the ear lobe. Swelling noted. Tenderness to palpation. However, no obvious fluctuance felt.  Eyes:     Conjunctiva/sclera: Conjunctivae normal.     Pupils: Pupils are equal, round, and reactive to light.  Pulmonary:     Effort: Pulmonary effort is normal. No respiratory distress.  Skin:    General: Skin is warm and dry.  Neurological:     Mental Status: She is alert and oriented to person, place, and time.      UC Treatments / Results  Labs (all labs ordered are listed, but only abnormal results are displayed) Labs Reviewed - No data to display  EKG   Radiology No results found.  Procedures Incision and Drainage  Date/Time: 03/16/2019 5:26 PM Performed by: Belinda Fisher, PA-C Authorized by: Belinda Fisher, PA-C   Consent:    Consent obtained:  Verbal   Consent given by:  Patient   Risks discussed:  Bleeding, incomplete drainage, pain, infection and damage to other organs   Alternatives discussed:  Alternative treatment and  referral Location:    Indications for incision and drainage: possible abscess.   Size:  None felt- but swelling about 0.5cm x 0.3cm   Location:  Head   Head location:  R external ear Pre-procedure details:    Skin preparation:  Chloraprep Anesthesia (see MAR for exact dosages):    Anesthesia method:  Local infiltration   Local anesthetic:  Lidocaine 2% WITH epi Procedure type:    Complexity:  Simple Procedure details:    Needle aspiration: no     Incision types:  Stab incision   Incision depth:  Dermal   Scalpel blade:  11   Drainage:  Bloody   Drainage amount:  Scant   Wound treatment:  Wound left open   Packing materials:  None Post-procedure details:    Patient tolerance of procedure:  Tolerated well, no immediate complications   (including critical care time)  Medications Ordered in UC Medications - No data to display  Initial Impression / Assessment and Plan / UC Course  I have reviewed the triage vital signs and the nursing notes.  Pertinent labs & imaging results that were available during my care of the patient were reviewed by me and considered in my medical decision making (see chart for details).    No obvious abscess felt. Given location, discussed without obvious abscess, not comfortable cutting deep incision. Discussed treating with abx and heat compress with monitoring. Patient states would like attempt to I&D. Risks and benefits discussed, patient expresses understanding but would like to continue with drainage.  Attempted I&D without purulent drainage. Patient states responsive to bactrim in the past with similar symptoms. Bactrim as directed. Warm compress. Return precautions given.  Final Clinical Impressions(s) / UC Diagnoses   Final diagnoses:  Cellulitis of right external ear   ED Prescriptions    Medication Sig Dispense Auth. Provider   sulfamethoxazole-trimethoprim (BACTRIM DS) 800-160 MG tablet Take 1 tablet by mouth 2 (two) times daily for 7  days. 14 tablet Belinda Fisher, PA-C     PDMP not reviewed this encounter.   Belinda Fisher, PA-C 03/16/19 1728

## 2019-03-16 NOTE — ED Triage Notes (Signed)
Pt presents to [facility] for assessment of right ear pain x 4-5 days.  Pt states she can;t get her earrings in because the outside hurts so much.

## 2019-04-18 ENCOUNTER — Other Ambulatory Visit: Payer: Self-pay

## 2019-04-18 ENCOUNTER — Inpatient Hospital Stay (HOSPITAL_COMMUNITY)
Admission: AD | Admit: 2019-04-18 | Discharge: 2019-04-18 | Disposition: A | Discharge status: Home / Self Care | Payer: Medicaid Other | Attending: Obstetrics and Gynecology | Admitting: Obstetrics and Gynecology

## 2019-04-18 DIAGNOSIS — Z3202 Encounter for pregnancy test, result negative: Secondary | ICD-10-CM

## 2019-04-18 DIAGNOSIS — R1030 Lower abdominal pain, unspecified: Secondary | ICD-10-CM | POA: Diagnosis not present

## 2019-04-18 LAB — POCT PREGNANCY, URINE
Preg Test, Ur: NEGATIVE
Preg Test, Ur: NEGATIVE

## 2019-04-18 NOTE — MAU Note (Signed)
.   Sara Higgins is a 29 y.o.  here in MAU reporting: that her LMP was 03/10/19 and she is experiencing lower abdominal pain. Pt states she did not take a pregnancy test at home LMP: 03/10/19 Onset of complaint: today Pain score: 5 Vitals:   04/18/19 1022  BP: 107/76  Pulse: 86  Resp: 16  Temp: (!) 97.3 F (36.3 C)  SpO2: 100%     FHT: Lab orders placed from triage: UPT

## 2019-04-18 NOTE — MAU Provider Note (Signed)
First Provider Initiated Contact with Patient 04/18/19 1025      S [name] is a 29 y.o. G3P1001 non-pregnant female who presents to MAU today with complaint of lower abdominal pain. Pt rates pain as 5/10.  O BP 107/76   Pulse 86   Temp (!) 97.3 F (36.3 C)   Resp 16   LMP 03/10/2019   SpO2 100%  Physical Exam  Constitutional: She is oriented to person, place, and time. She appears well-developed and well-nourished. No distress.  HENT:  Head: Normocephalic and atraumatic.  Respiratory: Effort normal.  Neurological: She is alert and oriented to person, place, and time.  Skin: She is not diaphoretic.  Psychiatric: She has a normal mood and affect. Her behavior is normal. Judgment and thought content normal.   A Non pregnant female Medical screening exam complete  P Discharge from MAU in stable condition Pt unable to be given a list of providers stating "you all don't want to see me because I'm not pregnant so can I just leave?" Pt was informed that she was able to leave, and a list of providers was offered to patient, but patient declines stating "no, I'll figure it out" as she was walking out of the room. Patient may return to MAU as needed for pregnancy related complaints  Farron Lafond, Odie Sera, NP 04/18/2019 10:35 AM

## 2019-07-06 ENCOUNTER — Other Ambulatory Visit (HOSPITAL_COMMUNITY): Payer: Self-pay

## 2019-07-06 DIAGNOSIS — Z3201 Encounter for pregnancy test, result positive: Secondary | ICD-10-CM

## 2020-04-18 IMAGING — US OBSTETRIC <14 WK ULTRASOUND
1 series · 15 of 28 positions shown · non-contrast
Comparison: None.

CLINICAL DATA: Abdominal pain

EXAM:
OBSTETRIC <14 WK US AND TRANSVAGINAL OB US
TECHNIQUE: Both transabdominal and transvaginal ultrasound examinations were
performed for complete evaluation of the gestation as well as the
maternal uterus, adnexal regions, and pelvic cul-de-sac.
Transvaginal technique was performed to assess early pregnancy.

[Series 1: obstetric <14 wk ultrasound · 15 of 56 slices shown]
[im 1/56]
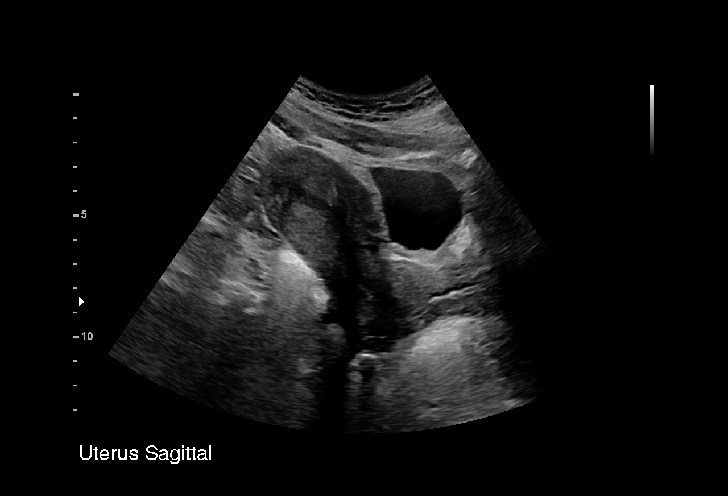
[im 5/56]
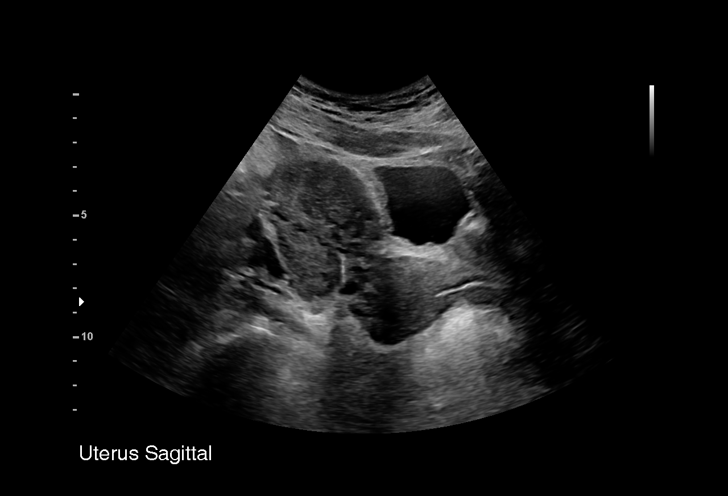
[im 9/56]
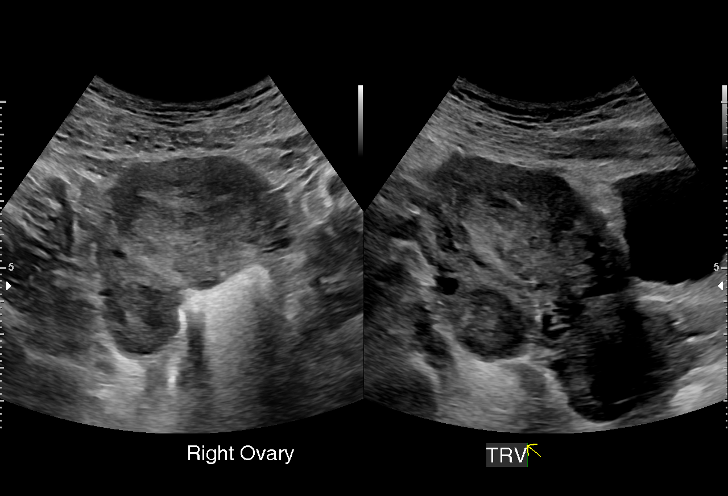
[im 13/56]
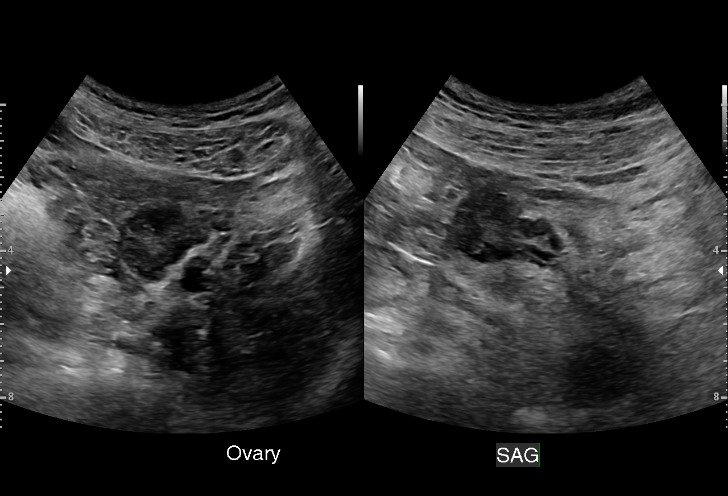
[im 17/56]
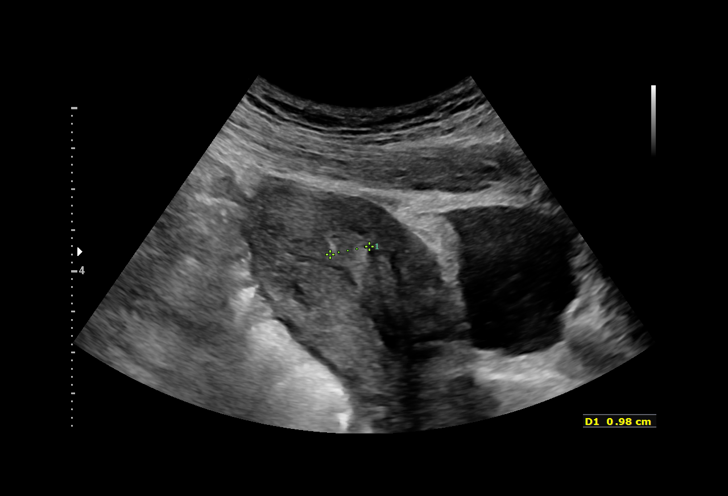
[im 21/56]
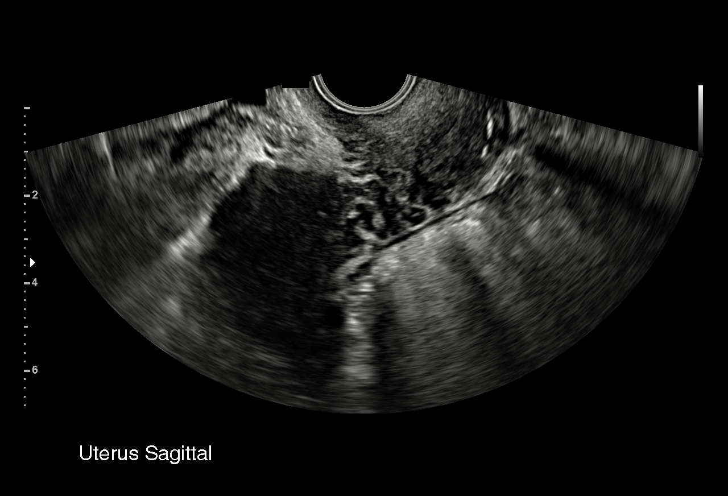
[im 25/56]
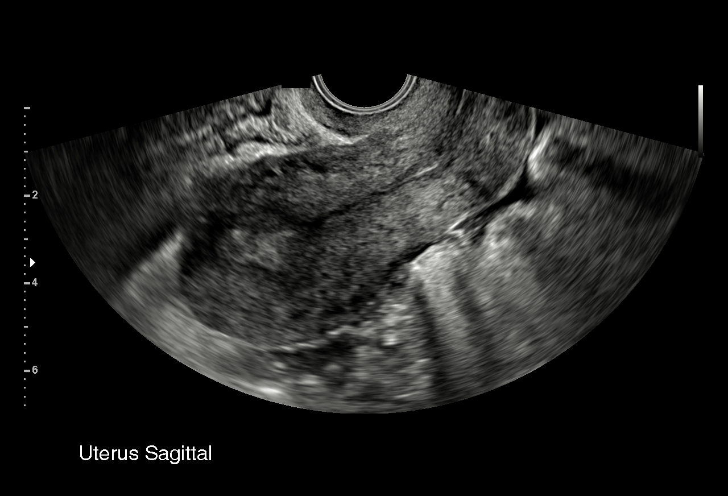
[im 29/56]
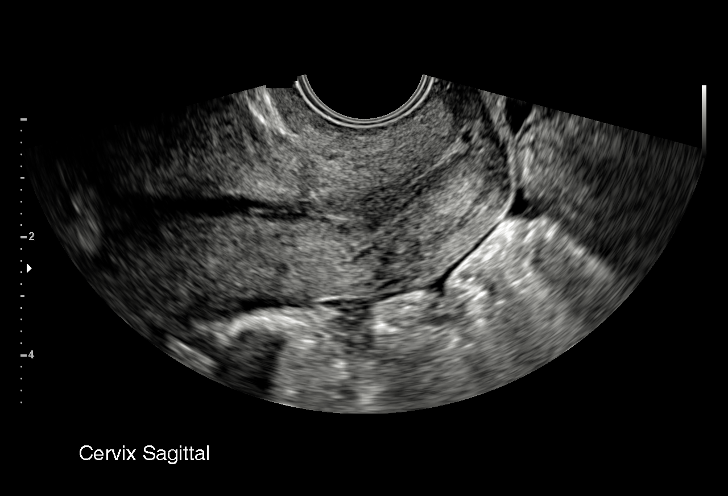
[im 31/56]
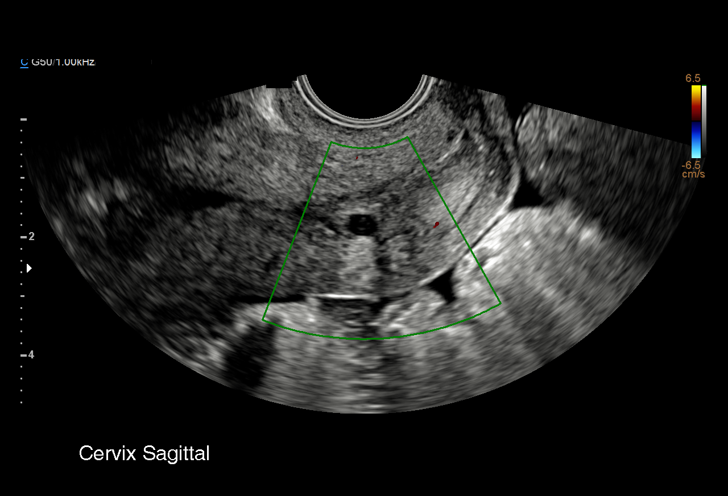
[im 35/56]
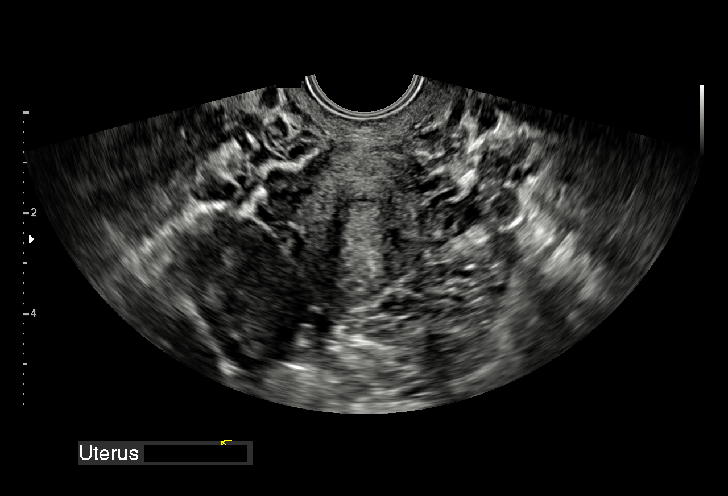
[im 39/56]
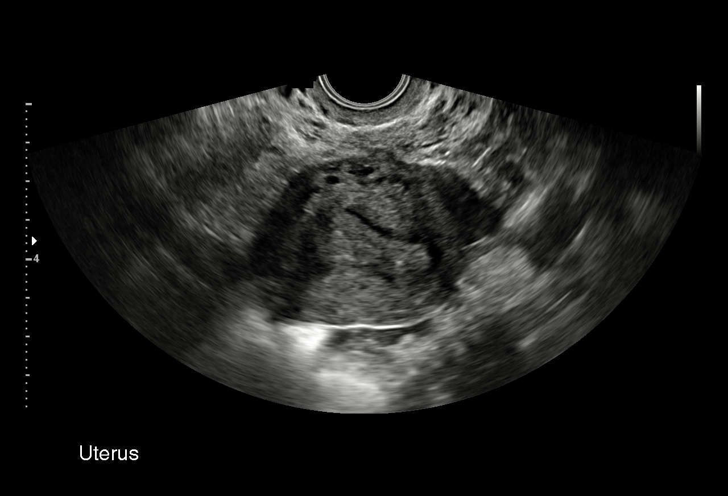
[im 43/56]
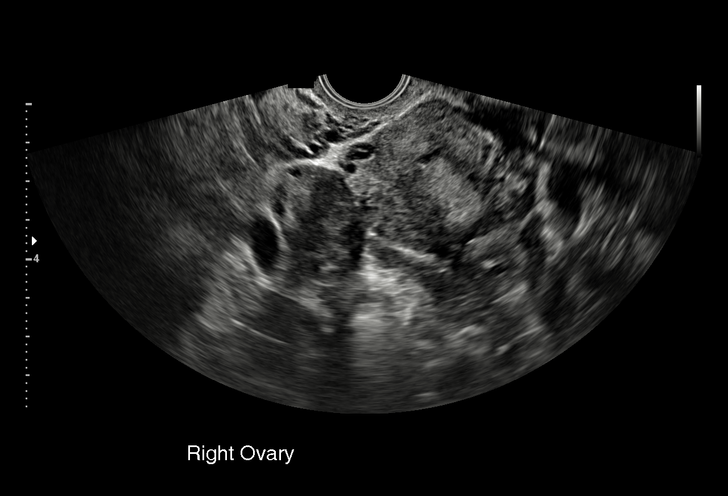
[im 47/56]
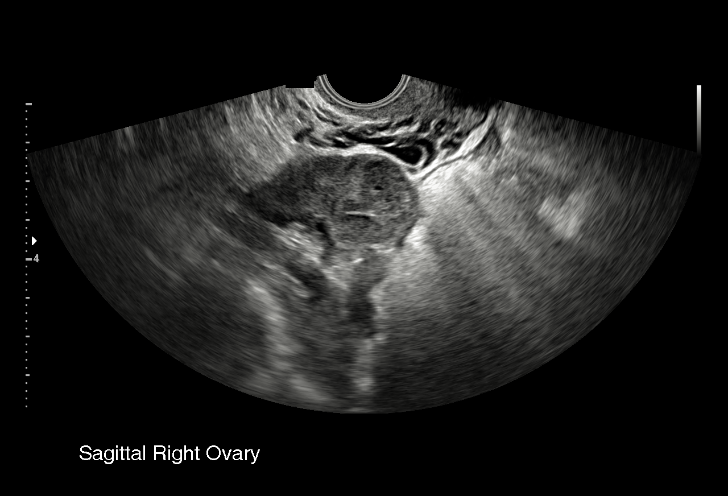
[im 51/56]
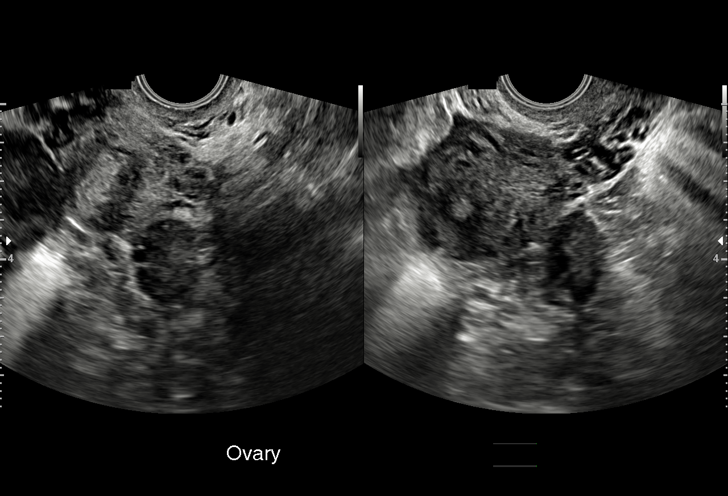
[im 56/56]
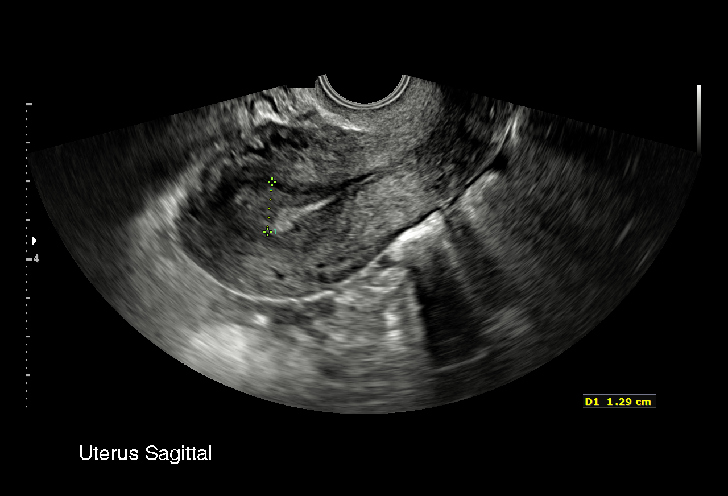

[15 of 28 positions shown; findings below may reference images not displayed]

FINDINGS: Intrauterine gestational sac: None

Yolk sac:  Not visualized

Embryo:  Not visualized

Cardiac Activity: Not visualized

Heart Rate:   bpm

MSD:   mm    w     d

CRL:    mm    w    d                  US EDC:

Subchorionic hemorrhage:  None visualized.

Maternal uterus/adnexae: No adnexal mass or free fluid. Endometrium
13 mm in thickness. Small amount of fluid in the endometrial canal.
IMPRESSION: No intrauterine pregnancy visualized. Differential considerations
would include early intrauterine pregnancy too early to visualize,
spontaneous abortion, or occult ectopic pregnancy. Recommend close
clinical followup and serial quantitative beta HCGs and ultrasounds.

## 2020-06-15 ENCOUNTER — Encounter (HOSPITAL_COMMUNITY): Payer: Self-pay | Admitting: Emergency Medicine

## 2020-06-15 ENCOUNTER — Emergency Department (HOSPITAL_COMMUNITY)
Admission: EM | Admit: 2020-06-15 | Discharge: 2020-06-15 | Disposition: A | Discharge status: Home / Self Care | Payer: Medicaid Other | Attending: Emergency Medicine | Admitting: Emergency Medicine

## 2020-06-15 ENCOUNTER — Other Ambulatory Visit: Payer: Self-pay

## 2020-06-15 DIAGNOSIS — B9689 Other specified bacterial agents as the cause of diseases classified elsewhere: Secondary | ICD-10-CM

## 2020-06-15 DIAGNOSIS — Z8719 Personal history of other diseases of the digestive system: Secondary | ICD-10-CM | POA: Diagnosis not present

## 2020-06-15 DIAGNOSIS — N760 Acute vaginitis: Secondary | ICD-10-CM | POA: Insufficient documentation

## 2020-06-15 DIAGNOSIS — R1030 Lower abdominal pain, unspecified: Secondary | ICD-10-CM

## 2020-06-15 DIAGNOSIS — R109 Unspecified abdominal pain: Secondary | ICD-10-CM | POA: Diagnosis present

## 2020-06-15 LAB — CBC
HCT: 41.5 % (ref 36.0–46.0)
Hemoglobin: 13.4 g/dL (ref 12.0–15.0)
MCH: 26.9 pg (ref 26.0–34.0)
MCHC: 32.3 g/dL (ref 30.0–36.0)
MCV: 83.3 fL (ref 80.0–100.0)
Platelets: 312 10*3/uL (ref 150–400)
RBC: 4.98 MIL/uL (ref 3.87–5.11)
RDW: 13.6 % (ref 11.5–15.5)
WBC: 6.6 10*3/uL (ref 4.0–10.5)
nRBC: 0 % (ref 0.0–0.2)

## 2020-06-15 LAB — URINALYSIS, ROUTINE W REFLEX MICROSCOPIC
Bacteria, UA: NONE SEEN
Bilirubin Urine: NEGATIVE
Glucose, UA: NEGATIVE mg/dL
Ketones, ur: NEGATIVE mg/dL
Leukocytes,Ua: NEGATIVE
Nitrite: NEGATIVE
Protein, ur: NEGATIVE mg/dL
Specific Gravity, Urine: 1.026 (ref 1.005–1.030)
pH: 6 (ref 5.0–8.0)

## 2020-06-15 LAB — I-STAT BETA HCG BLOOD, ED (MC, WL, AP ONLY): I-stat hCG, quantitative: <5 m[IU]/mL (ref <5)

## 2020-06-15 LAB — LIPASE, BLOOD: Lipase: 27 U/L (ref 11–51)

## 2020-06-15 LAB — COMPREHENSIVE METABOLIC PANEL
ALT: 19 U/L (ref 0–44)
AST: 19 U/L (ref 15–41)
Albumin: 3.6 g/dL (ref 3.5–5.0)
Alkaline Phosphatase: 51 U/L (ref 38–126)
Anion gap: 6 (ref 5–15)
BUN: 7 mg/dL (ref 6–20)
CO2: 25 mmol/L (ref 22–32)
Calcium: 9.1 mg/dL (ref 8.9–10.3)
Chloride: 107 mmol/L (ref 98–111)
Creatinine, Ser: 0.95 mg/dL (ref 0.44–1.00)
GFR, Estimated: >60 mL/min (ref >60)
Glucose, Bld: 90 mg/dL (ref 70–99)
Potassium: 3.8 mmol/L (ref 3.5–5.1)
Sodium: 138 mmol/L (ref 135–145)
Total Bilirubin: 0.6 mg/dL (ref 0.3–1.2)
Total Protein: 7.1 g/dL (ref 6.5–8.1)

## 2020-06-15 LAB — WET PREP, GENITAL
Clue Cells Wet Prep HPF POC: NONE SEEN
Sperm: NONE SEEN
Trich, Wet Prep: NONE SEEN
Yeast Wet Prep HPF POC: NONE SEEN

## 2020-06-15 MED ORDER — FLUCONAZOLE 150 MG PO TABS: 150.0000 mg | ORAL_TABLET | Freq: Every day | ORAL | 0 refills | Status: DC

## 2020-06-15 MED ORDER — SODIUM CHLORIDE 0.9 % IV BOLUS: 1000.0000 mL | Freq: Once | INTRAVENOUS | Status: DC

## 2020-06-15 MED ORDER — METRONIDAZOLE 500 MG PO TABS
500.0000 mg | ORAL_TABLET | Freq: Once | ORAL | Status: AC
  Administered 2020-06-15: 500 mg via ORAL
  Filled 2020-06-15: qty 1

## 2020-06-15 MED ORDER — METRONIDAZOLE 500 MG PO TABS: 500.0000 mg | ORAL_TABLET | Freq: Two times a day (BID) | ORAL | 0 refills | Status: DC

## 2020-06-15 NOTE — ED Provider Notes (Signed)
[facility] EMERGENCY DEPARTMENT Provider Note   CSN: [id number] Arrival date & time: 06/15/20  1601     History Chief Complaint  Patient presents with  . Abdominal Pain    Sara Higgins is a 30 y.o. female.  Pt presents to the ED today with abdominal pain for the past 2 days.  She's had increased vaginal d/c as well.  LMP in Feb was only 2 days which is abnormal for patient.  She has been trying to get pregnant.  No home tests taken.  No dysuria.  No n/v/d.        Past Medical History:  Diagnosis Date  . Acid reflux   . History of chlamydia 07/2014  . History of trichomoniasis 07/2014  . Vaginal Pap smear, abnormal     Patient Active Problem List   Diagnosis Date Noted  . Pap smear with atypical squamous cells, cannot exclude high grade squamous intraepithelial lesion (ASC-H) 08/26/2015    Past Surgical History:  Procedure Laterality Date  . WISDOM TOOTH EXTRACTION       OB History    Gravida  2   Para  1   Term  1   Preterm      AB      Living  1     SAB      IAB      Ectopic      Multiple      Live Births              Family History  Problem Relation Age of Onset  . Hypertension Other   . Diabetes Paternal Grandmother   . Hypertension Paternal Grandmother   . Cancer Paternal Grandmother        Ovarian    Social History   Tobacco Use  . Smoking status: Never Smoker  . Smokeless tobacco: Never Used  Vaping Use  . Vaping Use: Never used  Substance Use Topics  . Alcohol use: Yes  . Drug use: No    Home Medications Prior to Admission medications   Medication Sig Start Date End Date Taking? Authorizing Provider  fluconazole (DIFLUCAN) 150 MG tablet Take 1 tablet (150 mg total) by mouth daily. 06/15/20  Yes Jacalyn Lefevre, MD  metroNIDAZOLE (FLAGYL) 500 MG tablet Take 1 tablet (500 mg total) by mouth 2 (two) times daily. 06/15/20  Yes Jacalyn Lefevre, MD  acetaminophen (TYLENOL) 500 MG tablet Take 2  tablets (1,000 mg total) by mouth every 8 (eight) hours as needed. Patient not taking: Reported on 06/15/2020 07/11/18   Gerrit Heck, CNM    Allergies    Patient has no known allergies.  Review of Systems   Review of Systems  Gastrointestinal: Positive for abdominal pain.  All other systems reviewed and are negative.   Physical Exam Updated Vital Signs BP 109/81 (BP Location: Left Arm)   Pulse 76   Temp 99.1 F (37.3 C)   Resp 16   SpO2 100%   Physical Exam Vitals and nursing note reviewed.  Constitutional:      Appearance: She is well-developed.  HENT:     Head: Normocephalic and atraumatic.     Mouth/Throat:     Mouth: Mucous membranes are moist.     Pharynx: Oropharynx is clear.  Eyes:     Extraocular Movements: Extraocular movements intact.     Pupils: Pupils are equal, round, and reactive to light.  Cardiovascular:     Rate and Rhythm: Normal rate and  regular rhythm.  Pulmonary:     Effort: Pulmonary effort is normal.     Breath sounds: Normal breath sounds.  Abdominal:     General: Abdomen is flat. Bowel sounds are normal.     Palpations: Abdomen is soft.  Genitourinary:    Vagina: Normal.     Cervix: Discharge present.     Uterus: Normal.      Adnexa: Right adnexa normal and left adnexa normal.  Skin:    General: Skin is warm.     Capillary Refill: Capillary refill takes less than 2 seconds.  Neurological:     General: No focal deficit present.     Mental Status: She is alert and oriented to person, place, and time.  Psychiatric:        Mood and Affect: Mood normal.        Behavior: Behavior normal.     ED Results / Procedures / Treatments   Labs (all labs ordered are listed, but only abnormal results are displayed) Labs Reviewed  WET PREP, GENITAL - Abnormal; Notable for the following components:      Result Value   WBC, Wet Prep HPF POC FEW (*)    All other components within normal limits  URINALYSIS, ROUTINE W REFLEX MICROSCOPIC - Abnormal;  Notable for the following components:   APPearance HAZY (*)    Hgb urine dipstick LARGE (*)    All other components within normal limits  LIPASE, BLOOD  COMPREHENSIVE METABOLIC PANEL  CBC  I-STAT BETA HCG BLOOD, ED ([facility], [facility], [facility] ONLY)  GC/CHLAMYDIA PROBE AMP ([place]) NOT AT [facility]    EKG None  Radiology No results found.  Procedures Procedures   Medications Ordered in ED Medications  metroNIDAZOLE (FLAGYL) tablet 500 mg (has no administration in time range)    ED Course  I have reviewed the triage vital signs and the nursing notes.  Pertinent labs & imaging results that were available during my care of the patient were reviewed by me and considered in my medical decision making (see chart for details).    MDM Rules/Calculators/A&P                          Pregnancy is negative.  Labs wnl.  Pt did not want IVFs.    Pt's wet prep does not show clue cells, but she's had BV several times and has a malodorous d/c.  She wants to be treated as this feels similar to other episodes.  I will put her on flagyl.  She did not want treatment for gc/chl.  Pt is stable for d/c.  Return if worse.  Final Clinical Impression(s) / ED Diagnoses Final diagnoses:  Lower abdominal pain  Bacterial vaginosis    Rx / DC Orders ED Discharge Orders         Ordered    metroNIDAZOLE (FLAGYL) 500 MG tablet  2 times daily        06/15/20 1746    fluconazole (DIFLUCAN) 150 MG tablet  Daily        06/15/20 1746           Jacalyn Lefevre, MD 06/15/20 1747

## 2020-06-15 NOTE — ED Triage Notes (Signed)
C/o lower abd pain x 2 days.  Denies nausea, vomiting, diarrhea, and urinary complaints.  LMP 2/24 and only lasted 2 days (normally last 5 days).

## 2020-06-16 LAB — GC/CHLAMYDIA PROBE AMP (~~LOC~~) NOT AT ARMC
Chlamydia: NEGATIVE
Comment: NEGATIVE
Comment: NORMAL
Neisseria Gonorrhea: NEGATIVE

## 2020-08-27 ENCOUNTER — Other Ambulatory Visit: Payer: Self-pay

## 2020-08-27 ENCOUNTER — Encounter (HOSPITAL_COMMUNITY): Payer: Self-pay

## 2020-08-27 ENCOUNTER — Emergency Department (HOSPITAL_COMMUNITY): Payer: Medicaid Other

## 2020-08-27 ENCOUNTER — Emergency Department (HOSPITAL_COMMUNITY)
Admission: EM | Admit: 2020-08-27 | Discharge: 2020-08-27 | Disposition: A | Discharge status: Home / Self Care | Payer: Medicaid Other | Attending: Emergency Medicine | Admitting: Emergency Medicine

## 2020-08-27 DIAGNOSIS — M25512 Pain in left shoulder: Secondary | ICD-10-CM | POA: Insufficient documentation

## 2020-08-27 DIAGNOSIS — Y92410 Unspecified street and highway as the place of occurrence of the external cause: Secondary | ICD-10-CM | POA: Insufficient documentation

## 2020-08-27 DIAGNOSIS — S39012A Strain of muscle, fascia and tendon of lower back, initial encounter: Secondary | ICD-10-CM | POA: Diagnosis not present

## 2020-08-27 DIAGNOSIS — S34109A Unspecified injury to unspecified level of lumbar spinal cord, initial encounter: Secondary | ICD-10-CM | POA: Diagnosis present

## 2020-08-27 LAB — I-STAT BETA HCG BLOOD, ED (MC, WL, AP ONLY): I-stat hCG, quantitative: <5 m[IU]/mL (ref <5)

## 2020-08-27 MED ORDER — CYCLOBENZAPRINE HCL 5 MG PO TABS: 5.0000 mg | ORAL_TABLET | Freq: Three times a day (TID) | ORAL | 0 refills | Status: AC | PRN

## 2020-08-27 MED ORDER — IBUPROFEN 800 MG PO TABS: 800.0000 mg | ORAL_TABLET | Freq: Three times a day (TID) | ORAL | 1 refills | Status: DC | PRN

## 2020-08-27 NOTE — ED Notes (Signed)
NA for room x3. Will wait for 5-71mins in case pt went for XRAY

## 2020-08-27 NOTE — ED Notes (Signed)
Patient verbalizes understanding of discharge instructions. Opportunity for questioning and answers were provided. Armband removed by staff, pt discharged from ED ambulatory.   
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
## 2020-10-02 ENCOUNTER — Other Ambulatory Visit (HOSPITAL_COMMUNITY): Payer: Self-pay | Admitting: Family

## 2020-10-02 ENCOUNTER — Other Ambulatory Visit: Payer: Self-pay | Admitting: Family

## 2020-10-02 DIAGNOSIS — R102 Pelvic and perineal pain: Secondary | ICD-10-CM

## 2020-10-09 ENCOUNTER — Other Ambulatory Visit: Payer: Self-pay

## 2020-10-09 ENCOUNTER — Ambulatory Visit (HOSPITAL_COMMUNITY)
Admission: RE | Admit: 2020-10-09 | Discharge: 2020-10-09 | Disposition: A | Discharge status: Home / Self Care | Payer: Medicaid Other | Source: Ambulatory Visit | Attending: Family | Admitting: Family

## 2020-10-09 DIAGNOSIS — R102 Pelvic and perineal pain: Secondary | ICD-10-CM

## 2020-10-21 ENCOUNTER — Encounter: Payer: Self-pay | Admitting: Obstetrics and Gynecology

## 2020-10-21 ENCOUNTER — Other Ambulatory Visit: Payer: Self-pay

## 2020-10-21 ENCOUNTER — Ambulatory Visit (INDEPENDENT_AMBULATORY_CARE_PROVIDER_SITE_OTHER): Payer: Medicaid Other | Admitting: Obstetrics and Gynecology

## 2020-10-21 ENCOUNTER — Other Ambulatory Visit (HOSPITAL_COMMUNITY)
Admission: RE | Admit: 2020-10-21 | Discharge: 2020-10-21 | Disposition: A | Discharge status: Home / Self Care | Payer: Medicaid Other | Source: Ambulatory Visit | Attending: Obstetrics and Gynecology | Admitting: Obstetrics and Gynecology

## 2020-10-21 DIAGNOSIS — Z01419 Encounter for gynecological examination (general) (routine) without abnormal findings: Secondary | ICD-10-CM | POA: Insufficient documentation

## 2020-10-21 DIAGNOSIS — Z113 Encounter for screening for infections with a predominantly sexual mode of transmission: Secondary | ICD-10-CM | POA: Diagnosis not present

## 2020-10-21 DIAGNOSIS — Z3490 Encounter for supervision of normal pregnancy, unspecified, unspecified trimester: Secondary | ICD-10-CM | POA: Insufficient documentation

## 2020-10-21 DIAGNOSIS — N979 Female infertility, unspecified: Secondary | ICD-10-CM | POA: Insufficient documentation

## 2020-10-21 NOTE — Progress Notes (Signed)
GYNECOLOGY ANNUAL PREVENTATIVE CARE ENCOUNTER NOTE  History:     Sara Higgins is a 30 y.o. G39P1011 female here for a routine annual gynecologic exam.  Current complaints: infertility, intermittent left lower quadrant pain.   She is concerned about the intervals between menses and also is very worried about infertility.  Regarding infertility, the patient  notes IAB 2 years ago with medical management.  She has not been able to become pregnant since.  The pt does have a remote hx of chlamydia infection which was treated.  Spouse has never had a semenanalysis .  Discussed tubal factor, hormonal factor and female factor with patient.  Pt has regular menses 33-35 days apart.  Occasional hot flashes noted.  Pt is concerned about premature ovarian failure due to occasional hot flashes.  With regular menses this is unlikely.  Pt also notes intermittent LLQ pain over the last few months. The pain is described as an ache. Each episode lasts a few days.  She denies dyspareunia, dysuria, N/V.  There is no constipation or diarrhea.  Pt had recent normal pelvic ultrasound.   Gynecologic History Patient's last menstrual period was 10/14/2020 (exact date). Contraception: none Last Pap: 2018. Results were: normal with negative HPV  Obstetric History OB History  Gravida Para Term Preterm AB Living  2 1 1   1 1   SAB IAB Ectopic Multiple Live Births    0          # Outcome Date GA Lbr Len/2nd Weight Sex Delivery Anes PTL Lv  2 AB           1 Term      Vag-Spont       Past Medical History:  Diagnosis Date   Acid reflux    History of chlamydia 07/2014   History of trichomoniasis 07/2014   Vaginal Pap smear, abnormal     Past Surgical History:  Procedure Laterality Date   WISDOM TOOTH EXTRACTION      No current outpatient medications on file prior to visit.   No current facility-administered medications on file prior to visit.    No Known Allergies  Social History:  reports that she  has never smoked. She has never used smokeless tobacco. She reports current alcohol use. She reports that she does not use drugs.  Family History  Problem Relation Age of Onset   Hypertension Other    Diabetes Paternal Grandmother    Hypertension Paternal Grandmother    Cancer Paternal Grandmother        Ovarian    The following portions of the patient's history were reviewed and updated as appropriate: allergies, current medications, past family history, past medical history, past social history, past surgical history and problem list.  Review of Systems Pertinent items noted in HPI and remainder of comprehensive ROS otherwise negative.  Physical Exam:  BP 125/85 (BP Location: Right Arm, Patient Position: Sitting, Cuff Size: Normal)   Pulse 90   Ht 5\' 1"  (1.549 m)   Wt 169 lb (76.7 kg)   LMP 10/14/2020 (Exact Date)   Breastfeeding No   BMI 31.93 kg/m  CONSTITUTIONAL: Well-developed, well-nourished female in no acute distress.  HENT:  Normocephalic, atraumatic, External right and left ear normal. Oropharynx is clear and moist EYES: Conjunctivae and EOM are normal.  NECK: Normal range of motion, supple, no masses.  Normal thyroid.  SKIN: Skin is warm and dry. No rash noted. Not diaphoretic. No erythema. No pallor. MUSCULOSKELETAL: Normal range of  motion. No tenderness.  No cyanosis, clubbing, or edema.  2+ distal pulses. NEUROLOGIC: Alert and oriented to person, place, and time. Normal reflexes, muscle tone coordination.  PSYCHIATRIC: Normal mood and affect. Normal behavior. Normal judgment and thought content. CARDIOVASCULAR: Normal heart rate noted, regular rhythm RESPIRATORY: Clear to auscultation bilaterally. Effort and breath sounds normal, no problems with respiration noted. BREASTS: Symmetric in size. No masses, tenderness, skin changes, nipple drainage, or lymphadenopathy bilaterally. Performed in the presence of a chaperone. ABDOMEN: Soft, no distention noted.  Very mild  left lower quadrant tenderness , no rebound  PELVIC: Normal appearing external genitalia and urethral meatus; normal appearing vaginal mucosa and cervix.  No abnormal discharge noted.  Pap smear obtained.  Vaginal swab taken.  No abnormal odor noted.  No CMT noted.  Normal uterine size, no other palpable masses, no uterine or adnexal tenderness.  Performed in the presence of a chaperone.   Assessment and Plan:    1. Women's annual routine gynecological examination Routine exam today - Cytology - PAP  2. Screening examination for STD (sexually transmitted disease) Per pt request, aslo will check for BV, yeast or trich - Cervicovaginal ancillary only( [place]) - RPR+HBsAg+HCVAb+...  3. Infertility, female Pt is having regular cycles, even though the intervals are high normal. Pt reassured about likely fertility and need for workup.   Discussed timed intercourse around day 14-16 of cycle.  Discussed need for day 21 progesterone to eval if she is ovulating.  Pt advised to call first day of menses to set up blood draw.  Pt is concerned about premature menopause.  Will check random FSH today.  Do not believe she has premature ovarian failure.  Pt may need HSG for tubal evaluation in the future.  If no success in 3 months, consider 4 month trial of clomid followed by possible reproductive specialist referral.  - Follicle stimulating hormone - TSH - T4, free  Will follow up results of pap smear and manage accordingly. F/U in 3 months with timed intercourse, day 21 progesterone as above. Routine preventative health maintenance measures emphasized. Please refer to After Visit Summary for other counseling recommendations.      Mariel Aloe, MD, FACOG Obstetrician & Gynecologist, [facility], [facility]

## 2020-10-21 NOTE — Progress Notes (Signed)
Pt presents today for yearly exam. LMP: 10/14/20. BCM: none. Pt states she would like to try and conceive but has not been able to since IAB 2 years ago. Last pap: 2018 WNL. Pt states she is also having intermittent abdominal pain. States she is not having issues with bowel or bladder movement. Pt states are period 34-35 days and last 2-3 days and are moderate in flow.

## 2020-10-22 ENCOUNTER — Other Ambulatory Visit: Payer: Self-pay

## 2020-10-22 DIAGNOSIS — N760 Acute vaginitis: Secondary | ICD-10-CM

## 2020-10-22 DIAGNOSIS — B9689 Other specified bacterial agents as the cause of diseases classified elsewhere: Secondary | ICD-10-CM

## 2020-10-22 LAB — CERVICOVAGINAL ANCILLARY ONLY
Bacterial Vaginitis (gardnerella): POSITIVE — AB
Candida Glabrata: NEGATIVE
Candida Vaginitis: NEGATIVE
Chlamydia: NEGATIVE
Comment: NEGATIVE
Comment: NEGATIVE
Comment: NEGATIVE
Comment: NEGATIVE
Comment: NEGATIVE
Comment: NORMAL
Neisseria Gonorrhea: NEGATIVE
Trichomonas: NEGATIVE

## 2020-10-22 LAB — RPR+HBSAG+HCVAB+...
HIV Screen 4th Generation wRfx: NONREACTIVE
Hep C Virus Ab: <0.1 s/co ratio (ref 0.0–0.9)
Hepatitis B Surface Ag: NEGATIVE
RPR Ser Ql: NONREACTIVE

## 2020-10-22 LAB — TSH: TSH: 0.299 u[IU]/mL — ABNORMAL LOW (ref 0.450–4.500)

## 2020-10-22 LAB — T4, FREE: Free T4: 1.18 ng/dL (ref 0.82–1.77)

## 2020-10-22 LAB — FOLLICLE STIMULATING HORMONE: FSH: 5.2 m[IU]/mL

## 2020-10-23 ENCOUNTER — Other Ambulatory Visit: Payer: Self-pay | Admitting: Obstetrics and Gynecology

## 2020-10-23 MED ORDER — FLUCONAZOLE 150 MG PO TABS: 150.0000 mg | ORAL_TABLET | Freq: Once | ORAL | 0 refills | Status: AC

## 2020-10-23 MED ORDER — CLINDAMYCIN PHOSPHATE 2 % VA CREA: 1.0000 | TOPICAL_CREAM | Freq: Every day | VAGINAL | 0 refills | Status: DC

## 2020-10-24 LAB — CYTOLOGY - PAP
Comment: NEGATIVE
Diagnosis: NEGATIVE
Diagnosis: REACTIVE
High risk HPV: NEGATIVE

## 2021-01-23 ENCOUNTER — Other Ambulatory Visit (HOSPITAL_COMMUNITY)
Admission: RE | Admit: 2021-01-23 | Discharge: 2021-01-23 | Disposition: A | Discharge status: Home / Self Care | Payer: Medicaid Other | Source: Ambulatory Visit | Attending: Obstetrics & Gynecology | Admitting: Obstetrics & Gynecology

## 2021-01-23 ENCOUNTER — Other Ambulatory Visit: Payer: Self-pay

## 2021-01-23 ENCOUNTER — Ambulatory Visit (INDEPENDENT_AMBULATORY_CARE_PROVIDER_SITE_OTHER): Payer: Medicaid Other

## 2021-01-23 VITALS — BP 107/75 | HR 67 | Wt 167.0 lb

## 2021-01-23 DIAGNOSIS — N898 Other specified noninflammatory disorders of vagina: Secondary | ICD-10-CM | POA: Diagnosis not present

## 2021-01-23 DIAGNOSIS — Z113 Encounter for screening for infections with a predominantly sexual mode of transmission: Secondary | ICD-10-CM

## 2021-01-23 DIAGNOSIS — B9689 Other specified bacterial agents as the cause of diseases classified elsewhere: Secondary | ICD-10-CM

## 2021-01-23 DIAGNOSIS — B3731 Acute candidiasis of vulva and vagina: Secondary | ICD-10-CM

## 2021-01-23 DIAGNOSIS — N760 Acute vaginitis: Secondary | ICD-10-CM

## 2021-01-23 MED ORDER — FLUCONAZOLE 150 MG PO TABS: 150.0000 mg | ORAL_TABLET | Freq: Once | ORAL | 0 refills | Status: AC

## 2021-01-23 MED ORDER — CLINDAMYCIN PHOSPHATE 2 % VA CREA: 1.0000 | TOPICAL_CREAM | Freq: Every day | VAGINAL | 0 refills | Status: AC

## 2021-01-23 NOTE — Progress Notes (Signed)
SUBJECTIVE:  30 y.o. female complains of clear and foul vaginal discharge for couple of days. Pt reports recurrent BV infections and yeast infection after Abx use. She is going out of town today and requests rx for Clindamycin that was given to her in July. She has no relief with Metrogel nor Flagyl.  Denies abnormal vaginal bleeding or significant pelvic pain or fever. No UTI symptoms. Denies history of known exposure to STD.   OBJECTIVE:  She appears well, afebrile. Urine dipstick: not done.  ASSESSMENT:  Vaginal Discharge  Vaginal Odor   PLAN:  GC, chlamydia, trichomonas, BVAG, CVAG probe sent to lab. Treatment: Clindamycin sent to the pharmacy. Take Diflucan once treatment is complete ROV prn if symptoms persist or worsen.

## 2021-01-28 LAB — CERVICOVAGINAL ANCILLARY ONLY
Bacterial Vaginitis (gardnerella): POSITIVE — AB
Candida Glabrata: NEGATIVE
Candida Vaginitis: NEGATIVE
Chlamydia: NEGATIVE
Comment: NEGATIVE
Comment: NEGATIVE
Comment: NEGATIVE
Comment: NEGATIVE
Comment: NEGATIVE
Comment: NORMAL
Neisseria Gonorrhea: NEGATIVE
Trichomonas: NEGATIVE

## 2021-04-08 ENCOUNTER — Other Ambulatory Visit (HOSPITAL_COMMUNITY)
Admission: RE | Admit: 2021-04-08 | Discharge: 2021-04-08 | Disposition: A | Discharge status: Home / Self Care | Payer: Medicaid Other | Source: Ambulatory Visit | Attending: Obstetrics and Gynecology | Admitting: Obstetrics and Gynecology

## 2021-04-08 ENCOUNTER — Ambulatory Visit (INDEPENDENT_AMBULATORY_CARE_PROVIDER_SITE_OTHER): Payer: Medicaid Other

## 2021-04-08 ENCOUNTER — Other Ambulatory Visit: Payer: Self-pay

## 2021-04-08 VITALS — BP 128/71 | HR 76 | Wt 169.0 lb

## 2021-04-08 DIAGNOSIS — N898 Other specified noninflammatory disorders of vagina: Secondary | ICD-10-CM | POA: Insufficient documentation

## 2021-04-08 DIAGNOSIS — Z113 Encounter for screening for infections with a predominantly sexual mode of transmission: Secondary | ICD-10-CM

## 2021-04-08 NOTE — Progress Notes (Signed)
SUBJECTIVE:  31 y.o. female complains of clear and malodorous vaginal discharge for 7 day(s). Denies abnormal vaginal bleeding or significant pelvic pain or fever. No UTI symptoms. Denies history of known exposure to STD.  Patient's last menstrual period was 03/21/2021 (exact date).  OBJECTIVE:  She appears well, afebrile. Urine dipstick: not done.  ASSESSMENT:  Vaginal Discharge  Vaginal Odor   PLAN:  GC, chlamydia, trichomonas, BVAG, CVAG probe sent to lab.  Treatment: To be determined once lab results are received. Patient prefers the Clindamycin vaginal cream for BV treatments.  ROV prn if symptoms persist or worsen.

## 2021-04-08 NOTE — Progress Notes (Signed)
Agree with nurses's documentation of this patient's clinic encounter.  Kensi Karr L, MD  

## 2021-04-09 LAB — CERVICOVAGINAL ANCILLARY ONLY
Bacterial Vaginitis (gardnerella): POSITIVE — AB
Candida Glabrata: NEGATIVE
Candida Vaginitis: NEGATIVE
Chlamydia: NEGATIVE
Comment: NEGATIVE
Comment: NEGATIVE
Comment: NEGATIVE
Comment: NEGATIVE
Comment: NEGATIVE
Comment: NORMAL
Neisseria Gonorrhea: NEGATIVE
Trichomonas: NEGATIVE

## 2021-04-13 ENCOUNTER — Other Ambulatory Visit: Payer: Self-pay

## 2021-04-13 ENCOUNTER — Other Ambulatory Visit: Payer: Self-pay | Admitting: Obstetrics & Gynecology

## 2021-04-13 DIAGNOSIS — B9689 Other specified bacterial agents as the cause of diseases classified elsewhere: Secondary | ICD-10-CM

## 2021-04-13 DIAGNOSIS — B3731 Acute candidiasis of vulva and vagina: Secondary | ICD-10-CM

## 2021-04-13 MED ORDER — FLUCONAZOLE 150 MG PO TABS: 150.0000 mg | ORAL_TABLET | Freq: Once | ORAL | 0 refills | Status: AC

## 2021-04-13 MED ORDER — METRONIDAZOLE 500 MG PO TABS: 500.0000 mg | ORAL_TABLET | Freq: Two times a day (BID) | ORAL | 0 refills | Status: DC

## 2021-04-13 NOTE — Progress Notes (Addendum)
Patient noticed +BV test results on Mychart.  She is requesting rx. Flagyl sent to the pharmacy per protocol.  Addendum: Pt reports yeast infections after Abx use. She is requesting Diflucan.

## 2021-04-13 NOTE — Addendum Note (Signed)
Addended by: Dalphine Handing on: 04/13/2021 01:32 PM   Modules accepted: Orders

## 2021-04-14 ENCOUNTER — Other Ambulatory Visit: Payer: Self-pay | Admitting: Obstetrics & Gynecology

## 2021-04-14 DIAGNOSIS — B9689 Other specified bacterial agents as the cause of diseases classified elsewhere: Secondary | ICD-10-CM

## 2021-04-14 DIAGNOSIS — N760 Acute vaginitis: Secondary | ICD-10-CM

## 2021-04-21 ENCOUNTER — Other Ambulatory Visit: Payer: Self-pay

## 2021-04-21 MED ORDER — CLINDAMYCIN PHOSPHATE 2 % VA CREA
1.0000 | TOPICAL_CREAM | Freq: Every day | VAGINAL | Status: DC
Start: 2021-04-21 — End: 2022-06-17

## 2021-04-21 NOTE — Telephone Encounter (Signed)
Pt request Rx for clindamycin cream instead of flagyl due to side effects.

## 2021-04-22 ENCOUNTER — Ambulatory Visit: Payer: Medicaid Other | Admitting: Obstetrics

## 2021-04-24 NOTE — Progress Notes (Signed)
SUBJECTIVE:  °30 y.o. female complains of clear and malodorous vaginal discharge for 7 day(s). °Denies abnormal vaginal bleeding or significant pelvic pain or °fever. No UTI symptoms. Denies history of known exposure to STD. ° °Patient's last menstrual period was 03/21/2021 (exact date). ° °OBJECTIVE:  °She appears well, afebrile. °Urine dipstick: not done. ° °ASSESSMENT:  °Vaginal Discharge  °Vaginal Odor ° ° °PLAN:  °GC, chlamydia, trichomonas, BVAG, CVAG probe sent to lab. ° °Treatment: To be determined once lab results are received. Patient prefers the Clindamycin vaginal cream for BV treatments. ° °ROV prn if symptoms persist or worsen.  °

## 2021-04-24 NOTE — Progress Notes (Deleted)
SUBJECTIVE:  °31 y.o. female complains of clear and malodorous vaginal discharge for 7 day(s). °Denies abnormal vaginal bleeding or significant pelvic pain or °fever. No UTI symptoms. Denies history of known exposure to STD. ° °Patient's last menstrual period was 03/21/2021 (exact date). ° °OBJECTIVE:  °She appears well, afebrile. °Urine dipstick: not done. ° °ASSESSMENT:  °Vaginal Discharge  °Vaginal Odor ° ° °PLAN:  °GC, chlamydia, trichomonas, BVAG, CVAG probe sent to lab. ° °Treatment: To be determined once lab results are received. Patient prefers the Clindamycin vaginal cream for BV treatments. ° °ROV prn if symptoms persist or worsen.  °

## 2021-09-05 ENCOUNTER — Encounter: Payer: Self-pay | Admitting: Emergency Medicine

## 2021-09-05 ENCOUNTER — Ambulatory Visit
Admission: EM | Admit: 2021-09-05 | Discharge: 2021-09-05 | Disposition: A | Discharge status: Home / Self Care | Payer: Medicaid Other | Attending: Internal Medicine | Admitting: Internal Medicine

## 2021-09-05 DIAGNOSIS — L0201 Cutaneous abscess of face: Secondary | ICD-10-CM | POA: Diagnosis not present

## 2021-09-05 LAB — POCT URINE PREGNANCY: Preg Test, Ur: NEGATIVE

## 2021-09-05 MED ORDER — IBUPROFEN 600 MG PO TABS: 600.0000 mg | ORAL_TABLET | Freq: Four times a day (QID) | ORAL | 0 refills | Status: DC | PRN

## 2021-09-05 MED ORDER — DOXYCYCLINE HYCLATE 100 MG PO CAPS: 100.0000 mg | ORAL_CAPSULE | Freq: Two times a day (BID) | ORAL | 0 refills | Status: DC

## 2021-09-05 NOTE — Discharge Instructions (Addendum)
You have an abscess of the face which is being treated with antibiotics.  Please apply warm compresses to affected area.  Follow-up with the ER if symptoms do not improve in the next 48 hours.  Do not try to conceive while taking this medication.

## 2021-09-05 NOTE — ED Triage Notes (Signed)
Patient here for an abscess on right side of face x 2 days, tender to touch, redness.  Patient denies any pain meds.

## 2021-09-05 NOTE — ED Provider Notes (Signed)
[facility]    CSN: [id number] Arrival date & time: 09/05/21  1509      History   Chief Complaint Chief Complaint  Patient presents with   Abscess    HPI Sara ZUCKERMAN is a 31 y.o. female.   Patient presents with abscess to right side of face that has been present for approximately 2 days.  Patient reports that it is painful.  Denies fevers, bodies, chills, purulent drainage.  Patient has not taken any medication for pain.  Patient reports that she has had an abscess behind her ear before.   Abscess  Past Medical History:  Diagnosis Date   Acid reflux    History of chlamydia 07/2014   History of trichomoniasis 07/2014   Vaginal Pap smear, abnormal     Patient Active Problem List   Diagnosis Date Noted   Women's annual routine gynecological examination 10/21/2020   Screening examination for STD (sexually transmitted disease) 10/21/2020   Infertility, female 10/21/2020   Pap smear with atypical squamous cells, cannot exclude high grade squamous intraepithelial lesion (ASC-H) 08/26/2015    Past Surgical History:  Procedure Laterality Date   WISDOM TOOTH EXTRACTION      OB History     Gravida  2   Para  1   Term  1   Preterm      AB  1   Living  1      SAB      IAB  0   Ectopic      Multiple      Live Births               Home Medications    Prior to Admission medications   Medication Sig Start Date End Date Taking? Authorizing Provider  doxycycline (VIBRAMYCIN) 100 MG capsule Take 1 capsule (100 mg total) by mouth 2 (two) times daily. 09/05/21  Yes Grayling Schranz, Rolly Salter E, FNP  ibuprofen (ADVIL) 600 MG tablet Take 1 tablet (600 mg total) by mouth every 6 (six) hours as needed for mild pain. 09/05/21  Yes Benedict Kue, Rolly Salter E, FNP  metroNIDAZOLE (FLAGYL) 500 MG tablet Take 1 tablet (500 mg total) by mouth 2 (two) times daily. 04/13/21   Constant, Peggy, MD    Family History Family History  Problem Relation Age of Onset   Hypertension  Other    Diabetes Paternal Grandmother    Hypertension Paternal Grandmother    Cancer Paternal Grandmother        Ovarian    Social History Social History   Tobacco Use   Smoking status: Never   Smokeless tobacco: Never  Vaping Use   Vaping Use: Never used  Substance Use Topics   Alcohol use: Yes   Drug use: No     Allergies   Patient has no known allergies.   Review of Systems Review of Systems Per HPI  Physical Exam Triage Vital Signs ED Triage Vitals  Enc Vitals Group     BP 09/05/21 1520 109/72     Pulse Rate 09/05/21 1520 (!) 136     Resp 09/05/21 1520 18     Temp 09/05/21 1520 98.5 F (36.9 C)     Temp Source 09/05/21 1520 Oral     SpO2 09/05/21 1520 98 %     Weight 09/05/21 1523 175 lb (79.4 kg)     Height 09/05/21 1523 5\' 1"  (1.549 m)     Head Circumference --      Peak Flow --  Pain Score 09/05/21 1522 8     Pain Loc --      Pain Edu? --      Excl. in GC? --    No data found.  Updated Vital Signs BP 109/72 (BP Location: Left Arm)   Pulse 70   Temp 98.5 F (36.9 C) (Oral)   Resp 18   Ht 5\' 1"  (1.549 m)   Wt 175 lb (79.4 kg)   LMP 08/24/2021   SpO2 98%   BMI 33.07 kg/m   Visual Acuity Right Eye Distance:   Left Eye Distance:   Bilateral Distance:    Right Eye Near:   Left Eye Near:    Bilateral Near:     Physical Exam Constitutional:      General: She is not in acute distress.    Appearance: Normal appearance. She is not toxic-appearing or diaphoretic.  HENT:     Head: Normocephalic and atraumatic.  Eyes:     Extraocular Movements: Extraocular movements intact.     Conjunctiva/sclera: Conjunctivae normal.  Cardiovascular:     Rate and Rhythm: Normal rate and regular rhythm.     Pulses: Normal pulses.     Heart sounds: Normal heart sounds.  Pulmonary:     Effort: Pulmonary effort is normal.  Skin:    Comments: Approximately 1 cm in diameter indurated abscess present to right cheek directly below right eye.  No purulent  drainage noted.  Neurological:     General: No focal deficit present.     Mental Status: She is alert and oriented to person, place, and time. Mental status is at baseline.  Psychiatric:        Mood and Affect: Mood normal.        Behavior: Behavior normal.        Thought Content: Thought content normal.        Judgment: Judgment normal.     UC Treatments / Results  Labs (all labs ordered are listed, but only abnormal results are displayed) Labs Reviewed  POCT URINE PREGNANCY    EKG   Radiology No results found.  Procedures Procedures (including critical care time)  Medications Ordered in UC Medications - No data to display  Initial Impression / Assessment and Plan / UC Course  I have reviewed the triage vital signs and the nursing notes.  Pertinent labs & imaging results that were available during my care of the patient were reviewed by me and considered in my medical decision making (see chart for details).     Abscess to right side of face with no I&D indicated at this time.  Will prescribe antibiotics and patient was advised to warm compresses.  Patient was advised to go to the ER if no improvement in abscess with antibiotics and warm compresses in the next 48 hours.  Urine pregnancy test was completed prior to prescribing antibiotics as patient was not sure if she could be pregnant as she is trying to conceive.  Last menstrual cycle was a few weeks prior but reports that it was lighter than normal.  Urine pregnancy test was negative today.  Heart rate 136 in initial triage but this does not appear to be accurate as retake was 76 and cardiac exam appears normal.  Patient was given strict return precautions.  Patient verbalized understanding and was agreeable with plan. Final Clinical Impressions(s) / UC Diagnoses   Final diagnoses:  Abscess of face     Discharge Instructions      You  have an abscess of the face which is being treated with antibiotics.  Please  apply warm compresses to affected area.  Follow-up with the ER if symptoms do not improve in the next 48 hours.  Do not try to conceive while taking this medication.    ED Prescriptions     Medication Sig Dispense Auth. Provider   doxycycline (VIBRAMYCIN) 100 MG capsule Take 1 capsule (100 mg total) by mouth 2 (two) times daily. 20 capsule [place], ShamrockHaley E, [place]NP   ibuprofen (ADVIL) 600 MG tablet Take 1 tablet (600 mg total) by mouth every 6 (six) hours as needed for mild pain. 30 tablet [place], Acie FredricksonHaley E, [place]NP      PDMP not reviewed this encounter.   Gustavus BryantMound, Signora Zucco E, [place]FNP 09/05/21 [phone]

## 2021-09-21 ENCOUNTER — Ambulatory Visit (INDEPENDENT_AMBULATORY_CARE_PROVIDER_SITE_OTHER): Payer: Medicaid Other | Admitting: Obstetrics and Gynecology

## 2021-09-21 ENCOUNTER — Encounter: Payer: Self-pay | Admitting: Obstetrics and Gynecology

## 2021-09-21 ENCOUNTER — Other Ambulatory Visit (HOSPITAL_COMMUNITY)
Admission: RE | Admit: 2021-09-21 | Discharge: 2021-09-21 | Disposition: A | Discharge status: Home / Self Care | Payer: Medicaid Other | Source: Ambulatory Visit | Attending: Obstetrics and Gynecology | Admitting: Obstetrics and Gynecology

## 2021-09-21 VITALS — BP 116/79 | HR 80 | Wt 168.0 lb

## 2021-09-21 DIAGNOSIS — B3731 Acute candidiasis of vulva and vagina: Secondary | ICD-10-CM | POA: Insufficient documentation

## 2021-09-21 DIAGNOSIS — R7989 Other specified abnormal findings of blood chemistry: Secondary | ICD-10-CM | POA: Diagnosis not present

## 2021-09-21 DIAGNOSIS — N979 Female infertility, unspecified: Secondary | ICD-10-CM | POA: Diagnosis not present

## 2021-09-21 DIAGNOSIS — N760 Acute vaginitis: Secondary | ICD-10-CM | POA: Diagnosis not present

## 2021-09-21 DIAGNOSIS — Z113 Encounter for screening for infections with a predominantly sexual mode of transmission: Secondary | ICD-10-CM

## 2021-09-21 DIAGNOSIS — B9689 Other specified bacterial agents as the cause of diseases classified elsewhere: Secondary | ICD-10-CM | POA: Diagnosis not present

## 2021-09-21 DIAGNOSIS — Z30011 Encounter for initial prescription of contraceptive pills: Secondary | ICD-10-CM | POA: Diagnosis not present

## 2021-09-21 LAB — POCT URINE PREGNANCY: Preg Test, Ur: NEGATIVE

## 2021-09-21 MED ORDER — CLOMIPHENE CITRATE 50 MG PO TABS: 50.0000 mg | ORAL_TABLET | Freq: Every day | ORAL | 3 refills | Status: DC

## 2021-09-21 NOTE — Progress Notes (Signed)
   Subjective:    Patient ID: Sara Higgins, female    DOB: Apr 15, 1990, 31 y.o.   MRN: [id number]  HPI 31 yo G2P1 seen for STD check and discussion of infertility.  Pt is still trying to get pregnant.  Initially she wanted OCP because she thought it would jumpstart her fertility.  Pt was advised this is not the case.  Pt never followed up on slightly elevated TSH or got HSG   Review of Systems     Objective:   Physical Exam Vitals:   09/21/21 1107  BP: 116/79  Pulse: 80         Assessment & Plan:   1. Routine screening for STI (sexually transmitted infection) Per patient request  - Cervicovaginal ancillary only( [facility]) - Hepatitis B surface antigen - Hepatitis C antibody - HIV Antibody (routine testing w rflx) - RPR  2. Encounter for BCP (birth control pills) initial prescription Pt has changed her mind and no longer needs the medication  3. Infertility, female Will recheck thyroid panel today , will start clomid therapy HSG ordered If no progress in 4-6 months will consider referral to REI Pt advised to start PNV and follow timed intercourse  - Thyroid Panel With TSH - clomiPHENE (CLOMID) 50 MG tablet; Take 1 tablet (50 mg total) by mouth daily. Take on days 5-9 of your period  Dispense: 5 tablet; Refill: 3 - DG Hysterogram (HSG); Future - POCT urine pregnancy  4. Screening examination for STD (sexually transmitted disease)   5. TSH elevation Will refer to internal medicine for continued management if still elevated  - Ambulatory referral to Internal Medicine  Annual exam in 4 months. Reassess fertility at that time  Warden Fillers, MD Faculty Attending, [facility]

## 2021-09-21 NOTE — Progress Notes (Signed)
Pt presents to start birth control and requests all STD's.

## 2021-09-22 ENCOUNTER — Encounter: Payer: Self-pay | Admitting: Obstetrics and Gynecology

## 2021-09-22 LAB — CERVICOVAGINAL ANCILLARY ONLY
Bacterial Vaginitis (gardnerella): POSITIVE — AB
Candida Glabrata: NEGATIVE
Candida Vaginitis: POSITIVE — AB
Chlamydia: NEGATIVE
Comment: NEGATIVE
Comment: NEGATIVE
Comment: NEGATIVE
Comment: NEGATIVE
Comment: NEGATIVE
Comment: NORMAL
Neisseria Gonorrhea: NEGATIVE
Trichomonas: NEGATIVE

## 2021-09-22 LAB — HEPATITIS B SURFACE ANTIGEN: Hepatitis B Surface Ag: NEGATIVE

## 2021-09-22 LAB — HEPATITIS C ANTIBODY: Hep C Virus Ab: NONREACTIVE

## 2021-09-22 LAB — THYROID PANEL WITH TSH
Free Thyroxine Index: 1.7 (ref 1.2–4.9)
T3 Uptake Ratio: 21 % — ABNORMAL LOW (ref 24–39)
T4, Total: 8.1 ug/dL (ref 4.5–12.0)
TSH: 0.299 u[IU]/mL — ABNORMAL LOW (ref 0.450–4.500)

## 2021-09-22 LAB — HIV ANTIBODY (ROUTINE TESTING W REFLEX): HIV Screen 4th Generation wRfx: NONREACTIVE

## 2021-09-22 LAB — RPR: RPR Ser Ql: NONREACTIVE

## 2021-09-24 ENCOUNTER — Other Ambulatory Visit: Payer: Self-pay | Admitting: Emergency Medicine

## 2021-09-24 ENCOUNTER — Encounter: Payer: Self-pay | Admitting: Emergency Medicine

## 2021-09-24 DIAGNOSIS — B9689 Other specified bacterial agents as the cause of diseases classified elsewhere: Secondary | ICD-10-CM

## 2021-09-24 DIAGNOSIS — B3731 Acute candidiasis of vulva and vagina: Secondary | ICD-10-CM

## 2021-09-24 MED ORDER — FLUCONAZOLE 150 MG PO TABS: 150.0000 mg | ORAL_TABLET | Freq: Once | ORAL | 0 refills | Status: AC

## 2021-09-24 MED ORDER — METRONIDAZOLE 500 MG PO TABS: 500.0000 mg | ORAL_TABLET | Freq: Two times a day (BID) | ORAL | 0 refills | Status: DC

## 2021-09-24 NOTE — Progress Notes (Signed)
Rx for yeast and BV 

## 2021-09-29 ENCOUNTER — Ambulatory Visit
Admission: RE | Admit: 2021-09-29 | Discharge: 2021-09-29 | Disposition: A | Discharge status: Home / Self Care | Payer: Medicaid Other | Source: Ambulatory Visit | Attending: Obstetrics and Gynecology | Admitting: Obstetrics and Gynecology

## 2021-09-29 DIAGNOSIS — N979 Female infertility, unspecified: Secondary | ICD-10-CM

## 2021-10-27 ENCOUNTER — Inpatient Hospital Stay (HOSPITAL_COMMUNITY): Payer: Medicaid Other

## 2021-10-27 ENCOUNTER — Inpatient Hospital Stay (HOSPITAL_COMMUNITY)
Admission: AD | Admit: 2021-10-27 | Discharge: 2021-10-27 | Disposition: A | Discharge status: Home / Self Care | Payer: Medicaid Other | Attending: Obstetrics and Gynecology | Admitting: Obstetrics and Gynecology

## 2021-10-27 ENCOUNTER — Encounter (HOSPITAL_COMMUNITY): Payer: Self-pay

## 2021-10-27 DIAGNOSIS — O26891 Other specified pregnancy related conditions, first trimester: Secondary | ICD-10-CM | POA: Diagnosis present

## 2021-10-27 DIAGNOSIS — Z3490 Encounter for supervision of normal pregnancy, unspecified, unspecified trimester: Secondary | ICD-10-CM

## 2021-10-27 DIAGNOSIS — R109 Unspecified abdominal pain: Secondary | ICD-10-CM

## 2021-10-27 DIAGNOSIS — Z3A01 Less than 8 weeks gestation of pregnancy: Secondary | ICD-10-CM | POA: Diagnosis not present

## 2021-10-27 DIAGNOSIS — Z3491 Encounter for supervision of normal pregnancy, unspecified, first trimester: Secondary | ICD-10-CM | POA: Diagnosis not present

## 2021-10-27 DIAGNOSIS — R1030 Lower abdominal pain, unspecified: Secondary | ICD-10-CM | POA: Diagnosis not present

## 2021-10-27 LAB — URINALYSIS, ROUTINE W REFLEX MICROSCOPIC
Bacteria, UA: NONE SEEN
Bilirubin Urine: NEGATIVE
Glucose, UA: NEGATIVE mg/dL
Ketones, ur: 5 mg/dL — AB
Leukocytes,Ua: NEGATIVE
Nitrite: NEGATIVE
Protein, ur: NEGATIVE mg/dL
Specific Gravity, Urine: 1.013 (ref 1.005–1.030)
pH: 7 (ref 5.0–8.0)

## 2021-10-27 LAB — CBC
HCT: 38.3 % (ref 36.0–46.0)
Hemoglobin: 12.4 g/dL (ref 12.0–15.0)
MCH: 26.1 pg (ref 26.0–34.0)
MCHC: 32.4 g/dL (ref 30.0–36.0)
MCV: 80.5 fL (ref 80.0–100.0)
Platelets: 294 10*3/uL (ref 150–400)
RBC: 4.76 MIL/uL (ref 3.87–5.11)
RDW: 13 % (ref 11.5–15.5)
WBC: 7.6 10*3/uL (ref 4.0–10.5)
nRBC: 0 % (ref 0.0–0.2)

## 2021-10-27 LAB — ABO/RH: ABO/RH(D): O POS

## 2021-10-27 LAB — HCG, QUANTITATIVE, PREGNANCY: hCG, Beta Chain, Quant, S: 2764 m[IU]/mL — ABNORMAL HIGH (ref <5)

## 2021-10-27 LAB — POCT PREGNANCY, URINE: Preg Test, Ur: POSITIVE — AB

## 2021-10-27 NOTE — Discharge Instructions (Signed)
We have scheduled you for a repeat lab to be done on 7/27.  Please follow-up for these.  We have scheduled a repeat ultrasound in 2 weeks.  [facility].  Come back to the ER if you start having worsening pain, vaginal bleeding or you are feeling faint.

## 2021-10-27 NOTE — MAU Provider Note (Addendum)
History     [id number]  Arrival date and time: 10/27/21 1030   Chief Complaint  Patient presents with   Abdominal Pain   HPI Sara Higgins is a 31 y.o. at [redacted]w[redacted]d by LMP with PMHx notable for GERD, history of STI in the past who presents for abdominal pain. Recently had a positive pregnancy test at home 2 days ago and started to have a mild cramping pain in her lower abdomen that has been present in the last 2 days. No vaginal bleeding, dizziness or lightheadedness.  She had been prescribed clomiphene at the previous office visit, however she did not take this medication prior to becoming pregnant.  No abnormal vaginal discharge or itching, no fever, irritative urinary symptoms or flank pain.  Review of outside prenatal records from Office (in media tab): Yet to start prenatal care  --/--/O POS (07/25 1206)  OB History     Gravida  3   Para  1   Term  1   Preterm      AB  1   Living  1      SAB      IAB  0   Ectopic      Multiple      Live Births              Past Medical History:  Diagnosis Date   Acid reflux    History of chlamydia 07/2014   History of trichomoniasis 07/2014   Vaginal Pap smear, abnormal     Past Surgical History:  Procedure Laterality Date   WISDOM TOOTH EXTRACTION      Family History  Problem Relation Age of Onset   Hypertension Other    Diabetes Paternal Grandmother    Hypertension Paternal Grandmother    Cancer Paternal Grandmother        Ovarian    Social History   Socioeconomic History   Marital status: Single    Spouse name: Not on file   Number of children: Not on file   Years of education: Not on file   Highest education level: Not on file  Occupational History   Not on file  Tobacco Use   Smoking status: Never   Smokeless tobacco: Never  Vaping Use   Vaping Use: Never used  Substance and Sexual Activity   Alcohol use: Not Currently   Drug use: No   Sexual activity: Yes    Birth  control/protection: None  Other Topics Concern   Not on file  Social History Narrative   Not on file   Social Determinants of Health   Financial Resource Strain: Not on file  Food Insecurity: Not on file  Transportation Needs: Not on file  Physical Activity: Sufficiently Active (02/15/2017)   Exercise Vital Sign    Days of Exercise per Week: 5 days    Minutes of Exercise per Session: 30 min  Stress: Stress Concern Present (02/15/2017)   [employer] of Occupational Health - Occupational Stress Questionnaire    Feeling of Stress : To some extent  Social Connections: Somewhat Isolated (02/15/2017)   Social Connection and Isolation Panel [NHANES]    Frequency of Communication with Friends and Family: More than three times a week    Frequency of Social Gatherings with Friends and Family: More than three times a week    Attends Religious Services: More than 4 times per year    Active Member of [employer] or Organizations: No    Attends Banker  Meetings: Never    Marital Status: Never married  Intimate Partner Violence: Not At Risk (02/15/2017)   Humiliation, Afraid, Rape, and Kick questionnaire    Fear of Current or Ex-Partner: No    Emotionally Abused: No    Physically Abused: No    Sexually Abused: No    No Known Allergies  Current Facility-Administered Medications on File Prior to Encounter  Medication Dose Route Frequency Provider Last Rate Last Admin   clindamycin (CLEOCIN) 2 % vaginal cream 1 Applicatorful  1 Applicatorful Vaginal QHS Adam Phenix, MD       No current outpatient medications on file prior to encounter.    ROS Pertinent positives and negative per HPI, all others reviewed and negative  Physical Exam   BP 131/66 (BP Location: Right Arm)   Pulse 79   Temp 98.2 F (36.8 C) (Oral)   Resp 15   Ht 5\' 2"  (1.575 m)   Wt 77.2 kg   LMP 09/24/2021   SpO2 100%   BMI 31.11 kg/m   Patient Vitals for the past 24 hrs:  BP Temp Temp src Pulse  Resp SpO2 Height Weight  10/27/21 1346 131/66 -- -- 79 15 100 % -- --  10/27/21 1110 113/79 98.2 F (36.8 C) Oral 84 17 100 % 5\' 2"  (1.575 m) 77.2 kg    General: Alert, active, not in distress HEENT: Normal EOM movements, normal conjunctiva, lips are moist Heart: Normal rate Chest: No increased work of breathing, no respiratory distress, no accessory muscle usage Abdomen: Soft, mild tenderness to lower abdomen, no rebound tenderness, no guarding. MSK: Moves all limbs equally GU: Pelvic exam deferred Neuro: No focal deficits Skin: Warm and dry  Cervical Exam Not applicable  Labs Results for orders placed or performed during the hospital encounter of 10/27/21 (from the past 24 hour(s))  Urinalysis, Routine w reflex microscopic Urine, Clean Catch     Status: Abnormal   Collection Time: 10/27/21 11:07 AM  Result Value Ref Range   Color, Urine YELLOW YELLOW   APPearance CLEAR CLEAR   Specific Gravity, Urine 1.013 1.005 - 1.030   pH 7.0 5.0 - 8.0   Glucose, UA NEGATIVE NEGATIVE mg/dL   Hgb urine dipstick SMALL (A) NEGATIVE   Bilirubin Urine NEGATIVE NEGATIVE   Ketones, ur 5 (A) NEGATIVE mg/dL   Protein, ur NEGATIVE NEGATIVE mg/dL   Nitrite NEGATIVE NEGATIVE   Leukocytes,Ua NEGATIVE NEGATIVE   RBC / HPF 0-5 0 - 5 RBC/hpf   WBC, UA 0-5 0 - 5 WBC/hpf   Bacteria, UA NONE SEEN NONE SEEN   Squamous Epithelial / LPF 0-5 0 - 5  Pregnancy, urine POC     Status: Abnormal   Collection Time: 10/27/21 11:09 AM  Result Value Ref Range   Preg Test, Ur POSITIVE (A) NEGATIVE  CBC     Status: None   Collection Time: 10/27/21 12:06 PM  Result Value Ref Range   WBC 7.6 4.0 - 10.5 K/uL   RBC 4.76 3.87 - 5.11 MIL/uL   Hemoglobin 12.4 12.0 - 15.0 g/dL   HCT 10/29/21 10/29/21 - 32.9 %   MCV 80.5 80.0 - 100.0 fL   MCH 26.1 26.0 - 34.0 pg   MCHC 32.4 30.0 - 36.0 g/dL   RDW 92.4 26.8 - 34.1 %   Platelets 294 150 - 400 K/uL   nRBC 0.0 0.0 - 0.2 %  hCG, quantitative, pregnancy     Status: Abnormal    Collection Time: 10/27/21 12:06  PM  Result Value Ref Range   hCG, Beta Chain, Quant, S 2,764 (H) <5 mIU/mL  ABO/Rh     Status: None   Collection Time: 10/27/21 12:06 PM  Result Value Ref Range   ABO/RH(D) O POS    No rh immune globuloin      NOT A RH IMMUNE GLOBULIN CANDIDATE, PT RH POSITIVE Performed at [facility] [address] [place], [place] [address]     Imaging [place] LESS THAN 14 WEEKS WITH [place] TRANSVAGINAL  Result Date: 10/27/2021 CLINICAL DATA:  Lower abdominal pain.  Positive urine pregnancy test EXAM: OBSTETRIC <14 WK [place] AND TRANSVAGINAL OB US TECHNIQUE: Both transabdominal and transvaginal ultrasound examinations were performed for complete evaluation of the gestation as well as the maternal uterus, adnexal regions, and pelvic cul-de-sac. Transvaginal technique was performed to assess early pregnancy. COMPARISON:  10/09/2020 FINDINGS: Intrauterine gestational sac: Single Yolk sac:  Not Visualized. Embryo:  Not Visualized. MSD: 4.4 mm   5 w   1 d Subchorionic hemorrhage:  None visualized. Maternal uterus/adnexae: Bilateral ovaries and adnexal regions within normal limits. No free fluid. IMPRESSION: Probable early intrauterine gestational sac, but no yolk sac, fetal pole, or cardiac activity yet visualized. Recommend follow-up quantitative B-HCG levels and follow-up US in 14 days to assess viability. This recommendation follows SRU consensus guidelines: Diagnostic Criteria for Nonviable Pregnancy Early in the First Trimester. [place] Med [id number]. Electronically Signed   By: Davina Poke D.O.   On: 10/27/2021 12:59    MAU Course  Procedures Lab Orders         Urinalysis, Routine w reflex microscopic Urine, Clean Catch         CBC         hCG, quantitative, pregnancy         Pregnancy, urine POC    No orders of the defined types were placed in this encounter.  Imaging Orders         US OB LESS THAN 14 WEEKS WITH OB TRANSVAGINAL         US OB Comp Less 14  Wks     MDM 31 year old, G3P1011 @ [redacted]w[redacted]d by LMP, which is consistent with ultrasound done today -[redacted]w[redacted]d, here with mild lower abdominal pain. No concerns for acute abdomen on exam. Her labs show elevated serum hCG, consistent with early pregnancy, normal hgb, no concerns for UTI. US shows an intrauterine gestational sac, but no embyro or heart beat, which is expected for GA.   I suspect her symptoms may be related to implantation, persistently pregnancy bloating/pain.  No concerns for ectopic pregnancy, given intrauterine gestational sac noted on ultrasound.  Other differentials include MSK etiology, gastroenteritis.  I recommend follow-up beta-hCG levels in 2 days, as well as a follow-up ultrasound to confirm viability in 2 weeks.  Both of these appointments have been scheduled.  Informed patient about her results, scheduled appointments and reassured her about findings. She is excited about the pregnancy.  Assessment and Plan  1. Pregnancy at early stage - US OB Comp Less 14 Wks; Future - Discharge patient  2. Abdominal pain in pregnancy, first trimester - US OB Comp Less 14 Wks; Future - Discharge patient  3. [redacted] weeks gestation of pregnancy - Discharge patient  Dispo: discharged to home in stable condition.   Discharge Instructions     Discharge patient   Complete by: As directed    Discharge disposition: 01-Home or Self Care   Discharge patient  date: 10/27/2021      Liliane Channel MD MPH OB Fellow, Faculty Practice

## 2021-10-27 NOTE — MAU Note (Signed)
...  Sara Higgins is a 31 y.o. at Unknown here in MAU reporting: Intermittent lower abdominal pain that began two days ago after intercourse. She reports when she woke up this morning her pain had increased slightly so she is concerned she may be pregnant. Negative UPT 09/21/2021 noted in Epic. Denies vaginal bleeding, vaginal discharge, and vaginal odors.  +POCT Pregnancy Test in MAU.  LMP: 09/24/2021 Onset of complaint: Two days ago. Pain score: 5/10 lower abdomen  Lab orders placed from triage:  POCT Pregnancy, UA

## 2021-10-29 ENCOUNTER — Ambulatory Visit (INDEPENDENT_AMBULATORY_CARE_PROVIDER_SITE_OTHER): Payer: Medicaid Other | Admitting: *Deleted

## 2021-10-29 ENCOUNTER — Other Ambulatory Visit: Payer: Medicaid Other

## 2021-10-29 DIAGNOSIS — Z3490 Encounter for supervision of normal pregnancy, unspecified, unspecified trimester: Secondary | ICD-10-CM

## 2021-10-29 DIAGNOSIS — O200 Threatened abortion: Secondary | ICD-10-CM

## 2021-10-29 NOTE — Progress Notes (Signed)
Agree with nurses's documentation of this patient's clinic encounter.  Marlin Brys L, MD  

## 2021-10-29 NOTE — Progress Notes (Signed)
Pt is in office for repeat quant after recent MAU visit. Quant ordered routine per Dr Alysia Penna review. Pt states she is still having some mild cramping but states it is worse after she eats.  Pt made aware some mild cramping is normal, could have GI related problems as well due to food being bothersome.  Pt advised to stay well hydrated, continue to monitor symptoms. Advised of when to seek urgent care.   Will notify once results reviewed and plan. Advised to keep f/u ultrasound.

## 2021-10-30 ENCOUNTER — Ambulatory Visit: Payer: Medicaid Other

## 2021-10-30 LAB — BETA HCG QUANT (REF LAB): hCG Quant: 4668 m[IU]/mL

## 2021-11-10 ENCOUNTER — Other Ambulatory Visit (HOSPITAL_COMMUNITY): Payer: Self-pay | Admitting: Family Medicine

## 2021-11-10 ENCOUNTER — Ambulatory Visit (INDEPENDENT_AMBULATORY_CARE_PROVIDER_SITE_OTHER): Payer: Medicaid Other

## 2021-11-10 ENCOUNTER — Other Ambulatory Visit: Payer: Self-pay | Admitting: Obstetrics and Gynecology

## 2021-11-10 DIAGNOSIS — O3680X0 Pregnancy with inconclusive fetal viability, not applicable or unspecified: Secondary | ICD-10-CM

## 2021-11-10 DIAGNOSIS — O26891 Other specified pregnancy related conditions, first trimester: Secondary | ICD-10-CM | POA: Diagnosis not present

## 2021-11-10 DIAGNOSIS — R109 Unspecified abdominal pain: Secondary | ICD-10-CM | POA: Diagnosis not present

## 2021-11-10 DIAGNOSIS — Z3491 Encounter for supervision of normal pregnancy, unspecified, first trimester: Secondary | ICD-10-CM | POA: Diagnosis not present

## 2021-11-10 DIAGNOSIS — Z3490 Encounter for supervision of normal pregnancy, unspecified, unspecified trimester: Secondary | ICD-10-CM

## 2021-11-11 ENCOUNTER — Inpatient Hospital Stay (HOSPITAL_COMMUNITY)
Admission: AD | Admit: 2021-11-11 | Discharge: 2021-11-11 | Discharge status: Against Medical Advice | Payer: Medicaid Other | Attending: Obstetrics and Gynecology | Admitting: Obstetrics and Gynecology

## 2021-11-11 DIAGNOSIS — Z3A01 Less than 8 weeks gestation of pregnancy: Secondary | ICD-10-CM | POA: Insufficient documentation

## 2021-11-11 DIAGNOSIS — O26891 Other specified pregnancy related conditions, first trimester: Secondary | ICD-10-CM | POA: Insufficient documentation

## 2021-11-11 DIAGNOSIS — R109 Unspecified abdominal pain: Secondary | ICD-10-CM | POA: Insufficient documentation

## 2021-11-11 LAB — URINALYSIS, ROUTINE W REFLEX MICROSCOPIC
Bilirubin Urine: NEGATIVE
Glucose, UA: NEGATIVE mg/dL
Ketones, ur: NEGATIVE mg/dL
Leukocytes,Ua: NEGATIVE
Nitrite: NEGATIVE
Protein, ur: NEGATIVE mg/dL
Specific Gravity, Urine: 1.015 (ref 1.005–1.030)
pH: 6 (ref 5.0–8.0)

## 2021-11-11 NOTE — MAU Note (Signed)
Registration brought signed AMA form to nursing station.

## 2021-11-11 NOTE — MAU Note (Signed)
..  Sara Higgins is a 31 y.o. at [redacted]w[redacted]d here in MAU reporting: Has not been able to keep anything down for a week, has been vomiting and dry heaving, has vomited 4-5 times in the last 24 hours. Reports cramping in her lower abdomin. Denies vaginal bleeding.    LMP: 09/24/2021 Onset of complaint: a week ago Pain score: 5/10 Vitals:   11/11/21 1942  BP: 116/82  Pulse: 80  Resp: 15  Temp: 99 F (37.2 C)  SpO2: 100%     FHT:not indicated Lab orders placed from triage: UA

## 2021-12-03 ENCOUNTER — Other Ambulatory Visit: Payer: Self-pay

## 2021-12-03 MED ORDER — PRENATE MINI 18-0.6-0.4-350 MG PO CAPS: 1.0000 | ORAL_CAPSULE | Freq: Every day | ORAL | 11 refills | Status: DC

## 2021-12-03 MED ORDER — DOXYLAMINE-PYRIDOXINE 10-10 MG PO TBEC: 2.0000 | DELAYED_RELEASE_TABLET | Freq: Every day | ORAL | 2 refills | Status: DC

## 2021-12-03 NOTE — Progress Notes (Signed)
S/w pt and she is requesting rx for nausea and PNV, sent per protocol

## 2021-12-14 ENCOUNTER — Encounter: Payer: Self-pay | Admitting: Obstetrics and Gynecology

## 2021-12-14 ENCOUNTER — Other Ambulatory Visit (HOSPITAL_COMMUNITY)
Admission: RE | Admit: 2021-12-14 | Discharge: 2021-12-14 | Disposition: A | Discharge status: Home / Self Care | Payer: Medicaid Other | Source: Ambulatory Visit | Attending: Obstetrics and Gynecology | Admitting: Obstetrics and Gynecology

## 2021-12-14 ENCOUNTER — Ambulatory Visit (INDEPENDENT_AMBULATORY_CARE_PROVIDER_SITE_OTHER): Payer: Medicaid Other | Admitting: Licensed Clinical Social Worker

## 2021-12-14 ENCOUNTER — Ambulatory Visit (INDEPENDENT_AMBULATORY_CARE_PROVIDER_SITE_OTHER): Payer: Medicaid Other | Admitting: Obstetrics and Gynecology

## 2021-12-14 VITALS — BP 108/74 | HR 89 | Wt 159.0 lb

## 2021-12-14 DIAGNOSIS — F4322 Adjustment disorder with anxiety: Secondary | ICD-10-CM

## 2021-12-14 DIAGNOSIS — Z3481 Encounter for supervision of other normal pregnancy, first trimester: Secondary | ICD-10-CM | POA: Diagnosis not present

## 2021-12-14 DIAGNOSIS — Z3A11 11 weeks gestation of pregnancy: Secondary | ICD-10-CM | POA: Diagnosis not present

## 2021-12-14 DIAGNOSIS — Z3480 Encounter for supervision of other normal pregnancy, unspecified trimester: Secondary | ICD-10-CM | POA: Insufficient documentation

## 2021-12-14 DIAGNOSIS — O26891 Other specified pregnancy related conditions, first trimester: Secondary | ICD-10-CM | POA: Insufficient documentation

## 2021-12-14 DIAGNOSIS — R12 Heartburn: Secondary | ICD-10-CM

## 2021-12-14 MED ORDER — FAMOTIDINE 20 MG PO TABS: 20.0000 mg | ORAL_TABLET | Freq: Two times a day (BID) | ORAL | 2 refills | Status: DC

## 2021-12-14 NOTE — Progress Notes (Signed)
INITIAL PRENATAL VISIT NOTE  Subjective:  Sara Higgins is a 31 y.o. G3P1011 at [redacted]w[redacted]d by LMP c/w early ultrasound being seen today for her initial prenatal visit.She has an obstetric history significant for SVD. She has an uncomplicated medical history.  Patient reports no complaints.  Contractions: Not present. Vag. Bleeding: None.   . Denies leaking of fluid.    Past Medical History:  Diagnosis Date   Acid reflux    History of chlamydia 07/2014   History of trichomoniasis 07/2014   Vaginal Pap smear, abnormal     Past Surgical History:  Procedure Laterality Date   WISDOM TOOTH EXTRACTION      OB History  Gravida Para Term Preterm AB Living  3 1 1   1 1   SAB IAB Ectopic Multiple Live Births    0          # Outcome Date GA Lbr Len/2nd Weight Sex Delivery Anes PTL Lv  3 Current           2 AB           1 Term      Vag-Spont       Social History   Socioeconomic History   Marital status: Single    Spouse name: Not on file   Number of children: Not on file   Years of education: Not on file   Highest education level: Not on file  Occupational History   Not on file  Tobacco Use   Smoking status: Never   Smokeless tobacco: Never  Vaping Use   Vaping Use: Never used  Substance and Sexual Activity   Alcohol use: Not Currently   Drug use: No   Sexual activity: Yes    Birth control/protection: None  Other Topics Concern   Not on file  Social History Narrative   Not on file   Social Determinants of Health   Financial Resource Strain: Not on file  Food Insecurity: Not on file  Transportation Needs: Not on file  Physical Activity: Sufficiently Active (02/15/2017)   Exercise Vital Sign    Days of Exercise per Week: 5 days    Minutes of Exercise per Session: 30 min  Stress: Stress Concern Present (02/15/2017)   02/17/2017 of Occupational Health - Occupational Stress Questionnaire    Feeling of Stress : To some extent  Social Connections: Somewhat  Isolated (02/15/2017)   Social Connection and Isolation Panel [NHANES]    Frequency of Communication with Friends and Family: More than three times a week    Frequency of Social Gatherings with Friends and Family: More than three times a week    Attends Religious Services: More than 4 times per year    Active Member of 02/17/2017 or Organizations: No    Attends [employer]: Never    Marital Status: Never married    Family History  Problem Relation Age of Onset   Hypertension Other    Diabetes Paternal Grandmother    Hypertension Paternal Grandmother    Cancer Paternal Grandmother        Ovarian     Current Outpatient Medications:    Doxylamine-Pyridoxine (DICLEGIS) 10-10 MG TBEC, Take 2 tablets by mouth at bedtime. May add 1 tablet at breakfast and 1 tablet at lunch if needed, Disp: 100 tablet, Rfl: 2   famotidine (PEPCID) 20 MG tablet, Take 1 tablet (20 mg total) by mouth 2 (two) times daily., Disp: 60 tablet, Rfl: 2   Prenat-FeCbn-FeAsp-Meth-FA-DHA (  PRENATE MINI) 18-0.6-0.4-350 MG CAPS, Take 1 tablet by mouth daily., Disp: 30 capsule, Rfl: 11  Current Facility-Administered Medications:    clindamycin (CLEOCIN) 2 % vaginal cream 1 Applicatorful, 1 Applicatorful, Vaginal, QHS, Adam Phenix, MD  No Known Allergies  Review of Systems: Negative except for what is mentioned in HPI.  Objective:   Vitals:   12/14/21 1455  BP: 108/74  Pulse: 89  Weight: 159 lb (72.1 kg)    Fetal Status: Fetal Heart Rate (bpm): 165         Physical Exam: BP 108/74   Pulse 89   Wt 159 lb (72.1 kg)   LMP 09/24/2021   BMI 29.08 kg/m  CONSTITUTIONAL: Well-developed, well-nourished female in no acute distress.  NEUROLOGIC: Alert and oriented to person, place, and time. Normal reflexes, muscle tone coordination. No cranial nerve deficit noted. PSYCHIATRIC: Normal mood and affect. Normal behavior. Normal judgment and thought content. SKIN: Skin is warm and dry. No rash noted. Not  diaphoretic. No erythema. No pallor. HENT:  Normocephalic, atraumatic, External right and left ear normal. Oropharynx is clear and moist EYES: Conjunctivae and EOM are normal. NECK: Normal range of motion, supple, no masses CARDIOVASCULAR: Normal heart rate noted, regular rhythm RESPIRATORY: Effort and breath sounds normal, no problems with respiration noted BREASTS: deferred ABDOMEN: Soft, nontender, nondistended, gravid. GU: deferred MUSCULOSKELETAL: Normal range of motion. EXT:  No edema and no tenderness. 2+ distal pulses.   Assessment and Plan:  Pregnancy: G3P1011 at [redacted]w[redacted]d by LMP  1. Supervision of other normal pregnancy, antepartum Continue routine prenatal care  - CBC/D/Plt+RPR+Rh+ABO+RubIgG... - Culture, OB Urine - Cervicovaginal ancillary only( [place]) - Panorama Prenatal Test Full Panel - [facility] Custom  2. [redacted] weeks gestation of pregnancy   3. Heartburn during pregnancy in first trimester Trial of pepcid for treatment, may need to advance to protonix if ineffective  - famotidine (PEPCID) 20 MG tablet; Take 1 tablet (20 mg total) by mouth 2 (two) times daily.  Dispense: 60 tablet; Refill: 2   Preterm labor symptoms and general obstetric precautions including but not limited to vaginal bleeding, contractions, leaking of fluid and fetal movement were reviewed in detail with the patient.  Please refer to After Visit Summary for other counseling recommendations.   Return in about 4 weeks (around 01/11/2022) for ROB, in person.  Warden Fillers 12/14/2021 5:40 PM

## 2021-12-14 NOTE — Progress Notes (Signed)
Pt complains of increase in vaginal d/c, request self swab.  Pt request Rx for heartburn.

## 2021-12-15 LAB — CERVICOVAGINAL ANCILLARY ONLY
Bacterial Vaginitis (gardnerella): NEGATIVE
Candida Glabrata: NEGATIVE
Candida Vaginitis: NEGATIVE
Chlamydia: NEGATIVE
Comment: NEGATIVE
Comment: NEGATIVE
Comment: NEGATIVE
Comment: NEGATIVE
Comment: NEGATIVE
Comment: NORMAL
Neisseria Gonorrhea: NEGATIVE
Trichomonas: NEGATIVE

## 2021-12-15 LAB — HCV INTERPRETATION

## 2021-12-15 LAB — CBC/D/PLT+RPR+RH+ABO+RUBIGG...
Antibody Screen: NEGATIVE
Basophils Absolute: 0.1 10*3/uL (ref 0.0–0.2)
Basos: 1 %
EOS (ABSOLUTE): 0.3 10*3/uL (ref 0.0–0.4)
Eos: 3 %
HCV Ab: NONREACTIVE
HIV Screen 4th Generation wRfx: NONREACTIVE
Hematocrit: 39.9 % (ref 34.0–46.6)
Hemoglobin: 13 g/dL (ref 11.1–15.9)
Hepatitis B Surface Ag: NEGATIVE
Immature Grans (Abs): 0 10*3/uL (ref 0.0–0.1)
Immature Granulocytes: 0 %
Lymphocytes Absolute: 2.3 10*3/uL (ref 0.7–3.1)
Lymphs: 21 %
MCH: 25.9 pg — ABNORMAL LOW (ref 26.6–33.0)
MCHC: 32.6 g/dL (ref 31.5–35.7)
MCV: 80 fL (ref 79–97)
Monocytes Absolute: 0.8 10*3/uL (ref 0.1–0.9)
Monocytes: 8 %
Neutrophils Absolute: 7.1 10*3/uL — ABNORMAL HIGH (ref 1.4–7.0)
Neutrophils: 67 %
Platelets: 318 10*3/uL (ref 150–450)
RBC: 5.01 x10E6/uL (ref 3.77–5.28)
RDW: 12.5 % (ref 11.7–15.4)
RPR Ser Ql: NONREACTIVE
Rh Factor: POSITIVE
Rubella Antibodies, IGG: 13.9 index (ref >0.99)
WBC: 10.5 10*3/uL (ref 3.4–10.8)

## 2021-12-17 LAB — CULTURE, OB URINE

## 2021-12-17 LAB — URINE CULTURE, OB REFLEX: Organism ID, Bacteria: NO GROWTH

## 2021-12-19 NOTE — BH Specialist Note (Signed)
Integrated Behavioral Health Initial In-Person Visit  MRN: [id number] Name: Sara Higgins  Number of [facility] Clinician visits: 1 Session Start time: 340pm   Session End time: 406pm Total time in minutes: 26 mins in person at [facility]   Types of Service: Individual psychotherapy  Interpretor:No. Interpretor Name and Language: none   Warm Hand Off Completed.        Subjective: Sara Higgins is a 31 y.o. female accompanied by Father of child Patient was referred by Dr Elgie Congo for elevated phq9 score. 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  School/Work: self employed  Self-Care: none Life Changes: new pregnancy  Patient and/or Family's Strengths/Protective Factors: Concrete supports in place (healthy food, safe environments, etc.)  Goals Addressed: Patient will: Reduce symptoms of: anxiety Increase knowledge and/or ability of: coping skills and stress reduction  Demonstrate ability to: Increase healthy adjustment to current life circumstances  Progress towards Goals: Ongoing  Interventions: Interventions utilized: Supportive Counseling  Standardized Assessments completed: PHQ 9  Patient and/or Family Response: Sara Higgins reports feeling overwhelmed and argumentative with father of baby since beginning of pregnancy approximately two months ago. Sara Higgins reports she is self employed and having difficulty concentrating at work due to feeling fatigue and relationship conflict.    Assessment: Patient currently experiencing adjustment disorder with anxiety.   Patient  may benefit from integrated behavioral health.  Plan: Follow up with behavioral health clinician on : 01/11/2022 Behavioral recommendations: Prioritize rest, write down concerns with partner to avoid conflict, deep breathing exercises in the morning and evening to reduce stress and improve rest, keep medical appts  Referral(s): [facility] (In Clinic) "From scale of 1-10, how likely are you to follow plan?":    Lynnea Ferrier, LCSW

## 2021-12-21 ENCOUNTER — Encounter: Payer: Self-pay | Admitting: Obstetrics and Gynecology

## 2021-12-23 LAB — PANORAMA PRENATAL TEST FULL PANEL:PANORAMA TEST PLUS 5 ADDITIONAL MICRODELETIONS: FETAL FRACTION: 4.7

## 2021-12-24 LAB — HORIZON CUSTOM: REPORT SUMMARY: POSITIVE — AB

## 2022-01-05 ENCOUNTER — Encounter: Payer: Medicaid Other | Admitting: Obstetrics and Gynecology

## 2022-01-11 ENCOUNTER — Ambulatory Visit (INDEPENDENT_AMBULATORY_CARE_PROVIDER_SITE_OTHER): Payer: Medicaid Other | Admitting: Family Medicine

## 2022-01-11 ENCOUNTER — Ambulatory Visit: Payer: Self-pay | Admitting: Licensed Clinical Social Worker

## 2022-01-11 ENCOUNTER — Encounter: Payer: Self-pay | Admitting: Family Medicine

## 2022-01-11 ENCOUNTER — Encounter: Payer: Medicaid Other | Admitting: Certified Nurse Midwife

## 2022-01-11 VITALS — BP 121/78 | HR 103 | Wt 164.3 lb

## 2022-01-11 DIAGNOSIS — O219 Vomiting of pregnancy, unspecified: Secondary | ICD-10-CM

## 2022-01-11 DIAGNOSIS — Z3480 Encounter for supervision of other normal pregnancy, unspecified trimester: Secondary | ICD-10-CM

## 2022-01-11 DIAGNOSIS — Z3482 Encounter for supervision of other normal pregnancy, second trimester: Secondary | ICD-10-CM

## 2022-01-11 DIAGNOSIS — Z3A15 15 weeks gestation of pregnancy: Secondary | ICD-10-CM

## 2022-01-11 DIAGNOSIS — D563 Thalassemia minor: Secondary | ICD-10-CM

## 2022-01-11 NOTE — Progress Notes (Signed)
Pt presents for ROB visit. Would like prenatal gummies instead of the pills. No other concerns at this time.

## 2022-01-11 NOTE — Progress Notes (Signed)
   PRENATAL VISIT NOTE  Subjective:  Sara Higgins is a 31 y.o. G3P1011 at [redacted]w[redacted]d being seen today for ongoing prenatal care.  She is currently monitored for the following issues for this low-risk pregnancy and has Pap smear with atypical squamous cells, cannot exclude high grade squamous intraepithelial lesion (ASC-H); Screening examination for STD (sexually transmitted disease); Supervision of other normal pregnancy, antepartum; and Heartburn during pregnancy in first trimester on their problem list.  Patient reports no complaints.  Contractions: Not present.  .   . Denies leaking of fluid.   The following portions of the patient's history were reviewed and updated as appropriate: allergies, current medications, past family history, past medical history, past social history, past surgical history and problem list.   Objective:   Vitals:   01/11/22 1428  BP: 121/78  Pulse: (!) 103  Weight: 164 lb 4.8 oz (74.5 kg)    Fetal Status: Fetal Heart Rate (bpm): 153         General:  Alert, oriented and cooperative. Patient is in no acute distress.  Skin: Skin is warm and dry. No rash noted.   Cardiovascular: Normal heart rate noted  Respiratory: Normal respiratory effort, no problems with respiration noted  Abdomen: Soft, gravid, appropriate for gestational age.  Pain/Pressure: Absent     Pelvic: Cervical exam deferred        Extremities: Normal range of motion.  Edema: None  Mental Status: Normal mood and affect. Normal behavior. Normal judgment and thought content.   Assessment and Plan:  Pregnancy: G3P1011 at [redacted]w[redacted]d  1. Supervision of other normal pregnancy, antepartum - [place] MFM OB COMP + 14 WK; Future   Nursing Staff Provider  Office Location   Dating  LMP  [facility] Model [x ] Traditional [ ]  Centering [ ]  Mom-Baby Dyad    Language   Anatomy US  ordered  Flu Vaccine   Genetic/Carrier Screen  NIPS:   Low risk Female AFP:    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  TDaP Vaccine     Hgb A1C or  GTT Early  Third trimester   COVID Vaccine    LAB RESULTS   Rhogam   N/A Blood Type O/Positive/-- (09/11 1537)   Baby Feeding Plan  Breast  Antibody Negative (09/11 1537)  Contraception  Rubella 13.90 (09/11 1537)  Circumcision n/a RPR Non Reactive (09/11 1537)   Pediatrician   HBsAg Negative (09/11 1537)   Support Person FOB- Mikail HCVAb  negative  Prenatal Classes  HIV Non Reactive (09/11 1537)     BTL Consent  GBS   (For PCN allergy, check sensitivities)   VBAC Consent  Pap NILM 10/21/2020       DME Rx [ ]  BP cuff [ ]  Weight Scale Waterbirth  [ ]  Class [ ]  Consent [ ]  CNM visit  PHQ9 & GAD7 [  ] new OB [  ] 28 weeks  [  ] 36 weeks Induction  [ ]  Orders Entered [ ] Foley Y/N  AFP next visit, Anatomy scan ordered  Preterm labor symptoms and general obstetric precautions including but not limited to vaginal bleeding, contractions, leaking of fluid and fetal movement were reviewed in detail with the patient. Please refer to After Visit Summary for other counseling recommendations.   Return in about 4 weeks (around 02/08/2022) for ROB, AFP.    Shelda Pal, [place] Fellow, Faculty practice Reserve for [facility] 01/11/22  2:58 PM

## 2022-02-04 ENCOUNTER — Ambulatory Visit: Payer: Medicaid Other | Admitting: *Deleted

## 2022-02-04 ENCOUNTER — Ambulatory Visit: Payer: Medicaid Other | Attending: Family Medicine

## 2022-02-04 VITALS — BP 113/76 | HR 100

## 2022-02-04 DIAGNOSIS — O99212 Obesity complicating pregnancy, second trimester: Secondary | ICD-10-CM | POA: Diagnosis not present

## 2022-02-04 DIAGNOSIS — Z3A19 19 weeks gestation of pregnancy: Secondary | ICD-10-CM | POA: Diagnosis not present

## 2022-02-04 DIAGNOSIS — Z3480 Encounter for supervision of other normal pregnancy, unspecified trimester: Secondary | ICD-10-CM

## 2022-02-04 DIAGNOSIS — Z148 Genetic carrier of other disease: Secondary | ICD-10-CM | POA: Insufficient documentation

## 2022-02-04 DIAGNOSIS — Z363 Encounter for antenatal screening for malformations: Secondary | ICD-10-CM | POA: Diagnosis present

## 2022-02-04 DIAGNOSIS — O321XX0 Maternal care for breech presentation, not applicable or unspecified: Secondary | ICD-10-CM | POA: Insufficient documentation

## 2022-02-05 ENCOUNTER — Other Ambulatory Visit: Payer: Self-pay | Admitting: *Deleted

## 2022-02-05 DIAGNOSIS — Z362 Encounter for other antenatal screening follow-up: Secondary | ICD-10-CM

## 2022-02-08 ENCOUNTER — Encounter: Payer: Self-pay | Admitting: Student

## 2022-02-08 ENCOUNTER — Ambulatory Visit (INDEPENDENT_AMBULATORY_CARE_PROVIDER_SITE_OTHER): Payer: Medicaid Other | Admitting: Student

## 2022-02-08 VITALS — BP 110/74 | HR 95 | Wt 170.0 lb

## 2022-02-08 DIAGNOSIS — O99212 Obesity complicating pregnancy, second trimester: Secondary | ICD-10-CM

## 2022-02-08 DIAGNOSIS — Z3482 Encounter for supervision of other normal pregnancy, second trimester: Secondary | ICD-10-CM

## 2022-02-08 DIAGNOSIS — Z3A19 19 weeks gestation of pregnancy: Secondary | ICD-10-CM

## 2022-02-08 DIAGNOSIS — O99210 Obesity complicating pregnancy, unspecified trimester: Secondary | ICD-10-CM

## 2022-02-08 DIAGNOSIS — D563 Thalassemia minor: Secondary | ICD-10-CM

## 2022-02-08 DIAGNOSIS — Z3480 Encounter for supervision of other normal pregnancy, unspecified trimester: Secondary | ICD-10-CM

## 2022-02-08 MED ORDER — PRENATAL ADULT GUMMY/DHA/FA 0.4-25 MG PO CHEW: 1.0000 [IU] | CHEWABLE_TABLET | Freq: Every day | ORAL | 2 refills | Status: DC

## 2022-02-08 NOTE — Progress Notes (Signed)
Pt presents for ROB visit. Pt request prescription for prenatal gummies. No other concerns at this time.

## 2022-02-08 NOTE — Progress Notes (Signed)
   PRENATAL VISIT NOTE  Subjective:  Sara Higgins is a 31 y.o. G3P1011 at 59w4dbeing seen today for ongoing prenatal care.  She is currently monitored for the following issues for this low-risk pregnancy and has Pap smear with atypical squamous cells, cannot exclude high grade squamous intraepithelial lesion (ASC-H); Screening examination for STD (sexually transmitted disease); Supervision of other normal pregnancy, antepartum; and Heartburn during pregnancy in first trimester on their problem list.  Patient reports no complaints.  Contractions: Not present. Vag. Bleeding: None.  Movement: Present. Denies leaking of fluid.   The following portions of the patient's history were reviewed and updated as appropriate: allergies, current medications, past family history, past medical history, past social history, past surgical history and problem list.   Objective:   Vitals:   02/08/22 1339  BP: 110/74  Pulse: 95  Weight: 170 lb (77.1 kg)    Fetal Status: Fetal Heart Rate (bpm): 143   Movement: Present     General:  Alert, oriented and cooperative. Patient is in no acute distress.  Skin: Skin is warm and dry. No rash noted.   Cardiovascular: Normal heart rate noted  Respiratory: Normal respiratory effort, no problems with respiration noted  Abdomen: Soft, gravid, appropriate for gestational age.  Pain/Pressure: Absent     Pelvic: Cervical exam deferred        Extremities: Normal range of motion.  Edema: None  Mental Status: Normal mood and affect. Normal behavior. Normal judgment and thought content.   Assessment and Plan:  Pregnancy: G3P1011 at 117w4d. Supervision of other normal pregnancy, antepartum - Baby "flutters" present  - Prenatal MV & Min w/FA-DHA (PRENATAL ADULT GUMMY/DHA/FA) 0.4-25 MG CHEW; Chew 1 Units by mouth daily.  Dispense: 90 tablet; Refill: 2  2. [redacted] weeks gestation of pregnancy - Routine follow-up. Anticipatory guidance provided for upcoming visits.  Encouraged family to start thinking about family planning and plan for after delivery.  - AFP, Serum, Open Spina Bifida  3. Alpha thalassemia silent carrier - Offered partner testing today. Kit provided.   4. Obesity affecting pregnancy, antepartum, unspecified obesity type - F/U ultrasound scheduled 03/11/22  Preterm labor symptoms and general obstetric precautions including but not limited to vaginal bleeding, contractions, leaking of fluid and fetal movement were reviewed in detail with the patient. Please refer to After Visit Summary for other counseling recommendations.   Return in about 4 weeks (around 03/08/2022) for LOB, IN-PERSON.  Future Appointments  Date Time Provider [place]12/07/2021  1:30 PM Constant, PeVickii ChafeMD [facility] None  03/11/2022 11:15 AM [facility]UR[facility]12/10/2021 11:30 AM [facility] US3 [facility] WMMorrice  NiJohnston EbbsNP

## 2022-02-10 LAB — AFP, SERUM, OPEN SPINA BIFIDA
AFP MoM: 1.46
AFP Value: 78.4 ng/mL
Gest. Age on Collection Date: 19.4 weeks
Maternal Age At EDD: 33.2 yr
OSBR Risk 1 IN: 6095
Test Results:: NEGATIVE
Weight: 170 [lb_av]

## 2022-02-28 ENCOUNTER — Encounter (HOSPITAL_COMMUNITY): Payer: Self-pay | Admitting: Obstetrics and Gynecology

## 2022-02-28 ENCOUNTER — Inpatient Hospital Stay (HOSPITAL_COMMUNITY)
Admission: AD | Admit: 2022-02-28 | Discharge: 2022-02-28 | Disposition: A | Discharge status: Home / Self Care | Payer: Medicaid Other | Attending: Obstetrics and Gynecology | Admitting: Obstetrics and Gynecology

## 2022-02-28 DIAGNOSIS — R102 Pelvic and perineal pain: Secondary | ICD-10-CM | POA: Insufficient documentation

## 2022-02-28 DIAGNOSIS — O26892 Other specified pregnancy related conditions, second trimester: Secondary | ICD-10-CM | POA: Diagnosis present

## 2022-02-28 DIAGNOSIS — Z3A22 22 weeks gestation of pregnancy: Secondary | ICD-10-CM | POA: Insufficient documentation

## 2022-02-28 DIAGNOSIS — R1031 Right lower quadrant pain: Secondary | ICD-10-CM | POA: Diagnosis not present

## 2022-02-28 DIAGNOSIS — O26899 Other specified pregnancy related conditions, unspecified trimester: Secondary | ICD-10-CM

## 2022-02-28 LAB — CBC
HCT: 33.3 % — ABNORMAL LOW (ref 36.0–46.0)
Hemoglobin: 10.7 g/dL — ABNORMAL LOW (ref 12.0–15.0)
MCH: 25.8 pg — ABNORMAL LOW (ref 26.0–34.0)
MCHC: 32.1 g/dL (ref 30.0–36.0)
MCV: 80.2 fL (ref 80.0–100.0)
Platelets: 239 10*3/uL (ref 150–400)
RBC: 4.15 MIL/uL (ref 3.87–5.11)
RDW: 13.5 % (ref 11.5–15.5)
WBC: 11.6 10*3/uL — ABNORMAL HIGH (ref 4.0–10.5)
nRBC: 0 % (ref 0.0–0.2)

## 2022-02-28 LAB — URINALYSIS, MICROSCOPIC (REFLEX)

## 2022-02-28 LAB — URINALYSIS, ROUTINE W REFLEX MICROSCOPIC
Bilirubin Urine: NEGATIVE
Glucose, UA: NEGATIVE mg/dL
Ketones, ur: NEGATIVE mg/dL
Leukocytes,Ua: NEGATIVE
Nitrite: NEGATIVE
Protein, ur: NEGATIVE mg/dL
Specific Gravity, Urine: 1.02 (ref 1.005–1.030)
pH: 6.5 (ref 5.0–8.0)

## 2022-02-28 MED ORDER — CYCLOBENZAPRINE HCL 5 MG PO TABS
10.0000 mg | ORAL_TABLET | Freq: Once | ORAL | Status: AC
  Administered 2022-02-28: 10 mg via ORAL
  Filled 2022-02-28: qty 2

## 2022-02-28 MED ORDER — CYCLOBENZAPRINE HCL 5 MG PO TABS: 5.0000 mg | ORAL_TABLET | Freq: Three times a day (TID) | ORAL | 0 refills | Status: DC | PRN

## 2022-02-28 NOTE — MAU Note (Signed)
.  Sara Higgins is a 31 y.o. at [redacted]w[redacted]d here in MAU reporting: sharp pain on right side since yesterday. Denies bleeding. Denies dysuria.  Onset of complaint: yesterday Pain score: 7/10 Vitals:   02/28/22 0938  BP: 107/68  Pulse: 93  Resp: 17  Temp: 98.6 F (37 C)  SpO2: 99%     FHT:145 Lab orders placed from triage:

## 2022-02-28 NOTE — MAU Provider Note (Signed)
Chief Complaint: Abdominal Pain   Event Date/Time   First Provider Initiated Contact with Patient 02/28/22 1014      SUBJECTIVE HPI: Sara Higgins is a 31 y.o. G3P1011 at [redacted]w[redacted]d by LMP who presents to maternity admissions reporting onset of sharp intermittent pain in her RLQ yesterday. She reports shopping for several hours 2 days ago which was [facility] Friday and thinks she may have overdone it. She is feeling fetal movement. She reports the pain is sharp, quick and comes and goes at rest and with movement.    HPI  Past Medical History:  Diagnosis Date   Acid reflux    History of chlamydia 07/2014   History of trichomoniasis 07/2014   Vaginal Pap smear, abnormal    Past Surgical History:  Procedure Laterality Date   WISDOM TOOTH EXTRACTION     Social History   Socioeconomic History   Marital status: Single    Spouse name: Not on file   Number of children: Not on file   Years of education: Not on file   Highest education level: Not on file  Occupational History   Not on file  Tobacco Use   Smoking status: Never   Smokeless tobacco: Never  Vaping Use   Vaping Use: Former   Substances: Nicotine, Flavoring  Substance and Sexual Activity   Alcohol use: Not Currently   Drug use: Never   Sexual activity: Not Currently    Birth control/protection: None  Other Topics Concern   Not on file  Social History Narrative   Not on file   Social Determinants of Health   Financial Resource Strain: Not on file  Food Insecurity: Not on file  Transportation Needs: Not on file  Physical Activity: Sufficiently Active (02/15/2017)   Exercise Vital Sign    Days of Exercise per Week: 5 days    Minutes of Exercise per Session: 30 min  Stress: Stress Concern Present (02/15/2017)   [employer] of Occupational Health - Occupational Stress Questionnaire    Feeling of Stress : To some extent  Social Connections: Somewhat Isolated (02/15/2017)   Social Connection and Isolation  Panel [NHANES]    Frequency of Communication with Friends and Family: More than three times a week    Frequency of Social Gatherings with Friends and Family: More than three times a week    Attends Religious Services: More than 4 times per year    Active Member of [employer] or Organizations: No    Attends Banker Meetings: Never    Marital Status: Never married  Intimate Partner Violence: Not At Risk (02/15/2017)   Humiliation, Afraid, Rape, and Kick questionnaire    Fear of Current or Ex-Partner: No    Emotionally Abused: No    Physically Abused: No    Sexually Abused: No   No current facility-administered medications on file prior to encounter.   Current Outpatient Medications on File Prior to Encounter  Medication Sig Dispense Refill   famotidine (PEPCID) 20 MG tablet Take 1 tablet (20 mg total) by mouth 2 (two) times daily. 60 tablet 2   Prenatal MV & Min w/FA-DHA (PRENATAL ADULT GUMMY/DHA/FA) 0.4-25 MG CHEW Chew 1 Units by mouth daily. 90 tablet 2   Doxylamine-Pyridoxine (DICLEGIS) 10-10 MG TBEC Take 2 tablets by mouth at bedtime. May add 1 tablet at breakfast and 1 tablet at lunch if needed (Patient not taking: Reported on 02/04/2022) 100 tablet 2   No Known Allergies  ROS:  Review of Systems  Constitutional:  Negative for chills, fatigue and fever.  Respiratory:  Negative for shortness of breath.   Cardiovascular:  Negative for chest pain.  Gastrointestinal:  Positive for abdominal pain. Negative for nausea and vomiting.  Genitourinary:  Negative for difficulty urinating, dysuria, flank pain, pelvic pain, vaginal bleeding, vaginal discharge and vaginal pain.  Musculoskeletal:  Negative for back pain.  Neurological:  Negative for dizziness and headaches.  Psychiatric/Behavioral: Negative.       I have reviewed patient's Past Medical Hx, Surgical Hx, Family Hx, Social Hx, medications and allergies.   Physical Exam  Patient Vitals for the past 24 hrs:  BP Temp  Temp src Pulse Resp SpO2 Height Weight  02/28/22 1143 (!) 103/51 -- -- 84 17 -- -- --  02/28/22 0957 107/65 -- -- 90 17 -- -- --  02/28/22 0938 107/68 98.6 F (37 C) Oral 93 17 99 % 5\' 1"  (1.549 m) 79.4 kg   Constitutional: Well-developed, well-nourished female in no acute distress.  Cardiovascular: normal rate Respiratory: normal effort GI: Abd soft, non-tender. No rebound tenderness or guarding. Pos BS x 4 MS: Extremities nontender, no edema, normal ROM Neurologic: Alert and oriented x 4.  GU: Neg CVAT.  PELVIC EXAM: Deferred  FHT 145 by doppler  LAB RESULTS Results for orders placed or performed during the hospital encounter of 02/28/22 (from the past 24 hour(s))  Urinalysis, Routine w reflex microscopic Urine, Clean Catch     Status: Abnormal   Collection Time: 02/28/22  9:25 AM  Result Value Ref Range   Color, Urine YELLOW YELLOW   APPearance CLEAR CLEAR   Specific Gravity, Urine 1.020 1.005 - 1.030   pH 6.5 5.0 - 8.0   Glucose, UA NEGATIVE NEGATIVE mg/dL   Hgb urine dipstick TRACE (A) NEGATIVE   Bilirubin Urine NEGATIVE NEGATIVE   Ketones, ur NEGATIVE NEGATIVE mg/dL   Protein, ur NEGATIVE NEGATIVE mg/dL   Nitrite NEGATIVE NEGATIVE   Leukocytes,Ua NEGATIVE NEGATIVE  Urinalysis, Microscopic (reflex)     Status: Abnormal   Collection Time: 02/28/22  9:25 AM  Result Value Ref Range   RBC / HPF 0-5 0 - 5 RBC/hpf   WBC, UA 0-5 0 - 5 WBC/hpf   Bacteria, UA RARE (A) NONE SEEN   Squamous Epithelial / LPF 6-10 0 - 5  CBC     Status: Abnormal   Collection Time: 02/28/22 10:33 AM  Result Value Ref Range   WBC 11.6 (H) 4.0 - 10.5 K/uL   RBC 4.15 3.87 - 5.11 MIL/uL   Hemoglobin 10.7 (L) 12.0 - 15.0 g/dL   HCT 03/02/22 (L) 85.8 - 85.0 %   MCV 80.2 80.0 - 100.0 fL   MCH 25.8 (L) 26.0 - 34.0 pg   MCHC 32.1 30.0 - 36.0 g/dL   RDW 27.7 41.2 - 87.8 %   Platelets 239 150 - 400 K/uL   nRBC 0.0 0.0 - 0.2 %    O/Positive/-- (09/11 1537)  IMAGING  MAU Management/MDM: Orders  Placed This Encounter  Procedures   Urinalysis, Routine w reflex microscopic Urine, Clean Catch   Urinalysis, Microscopic (reflex)   CBC   Discharge patient    Meds ordered this encounter  Medications   cyclobenzaprine (FLEXERIL) tablet 10 mg   cyclobenzaprine (FLEXERIL) 5 MG tablet    Sig: Take 1-2 tablets (5-10 mg total) by mouth 3 (three) times daily as needed for muscle spasms.    Dispense:  20 tablet    Refill:  0  Order Specific Question:   Supervising Provider    Answer:   Mariel Aloe A [[id number]]    Pt with sharp intermittent pain, occurs with movement and at rest. No acute abdomen, no elevated WBCs, no n/v so low suspicion for appendicitis.  No regular cramping, pain improved at rest in MAU. Likely MSK  pain given onset after shopping. Pain resolved with Flexeril today.   D/C home, Rx for Flexeril for PRN use.  Rest/ice/heat/warm bath/increase PO fluids/Tylenol/pregnancy support belt.  F[facility]U at [facility] as scheduled, return to MAU as needed for emergencies.   ASSESSMENT 1. Acute right lower quadrant pain   2. Pain of round ligament during pregnancy   3. Abdominal pain during pregnancy in second trimester   4. [redacted] weeks gestation of pregnancy     PLAN Discharge home Allergies as of 02/28/2022   No Known Allergies      Medication List     TAKE these medications    cyclobenzaprine 5 MG tablet Commonly known as: FLEXERIL Take 1-2 tablets (5-10 mg total) by mouth 3 (three) times daily as needed for muscle spasms.   Doxylamine-Pyridoxine 10-10 MG Tbec Commonly known as: Diclegis Take 2 tablets by mouth at bedtime. May add 1 tablet at breakfast and 1 tablet at lunch if needed   famotidine 20 MG tablet Commonly known as: Pepcid Take 1 tablet (20 mg total) by mouth 2 (two) times daily.   Prenatal Adult Gummy/DHA/FA 0.4-25 MG Chew Chew 1 Units by mouth daily.        Follow-up Information     [facility] Follow up.   Why: As scheduled Contact  information: [address] [place] [address] [phone]        [facility] Follow up.   Specialty: Obstetrics and Gynecology Why: As needed for emergencies Contact information: [address] [id number] Wilhe[place] Bonito [place] [address] [phone]                Sharen Counter Certified Nurse-Midwife 02/28/2022  11:51 AM

## 2022-03-08 ENCOUNTER — Encounter: Payer: Self-pay | Admitting: Obstetrics and Gynecology

## 2022-03-08 ENCOUNTER — Ambulatory Visit (INDEPENDENT_AMBULATORY_CARE_PROVIDER_SITE_OTHER): Payer: Medicaid Other | Admitting: Obstetrics and Gynecology

## 2022-03-08 VITALS — BP 109/70 | HR 88 | Wt 175.5 lb

## 2022-03-08 DIAGNOSIS — Z3480 Encounter for supervision of other normal pregnancy, unspecified trimester: Secondary | ICD-10-CM

## 2022-03-08 DIAGNOSIS — Z3482 Encounter for supervision of other normal pregnancy, second trimester: Secondary | ICD-10-CM

## 2022-03-08 DIAGNOSIS — O99212 Obesity complicating pregnancy, second trimester: Secondary | ICD-10-CM

## 2022-03-08 DIAGNOSIS — Z3A23 23 weeks gestation of pregnancy: Secondary | ICD-10-CM

## 2022-03-08 DIAGNOSIS — O99210 Obesity complicating pregnancy, unspecified trimester: Secondary | ICD-10-CM | POA: Insufficient documentation

## 2022-03-08 MED ORDER — ASPIRIN 81 MG PO TBEC: 81.0000 mg | DELAYED_RELEASE_TABLET | Freq: Every day | ORAL | 2 refills | Status: DC

## 2022-03-08 NOTE — Progress Notes (Signed)
   PRENATAL VISIT NOTE  Subjective:  Sara Higgins is a 31 y.o. G3P1011 at [redacted]w[redacted]d being seen today for ongoing prenatal care.  She is currently monitored for the following issues for this low-risk pregnancy and has Pap smear with atypical squamous cells, cannot exclude high grade squamous intraepithelial lesion (ASC-H); Screening examination for STD (sexually transmitted disease); Supervision of other normal pregnancy, antepartum; Heartburn during pregnancy in first trimester; and Obesity affecting pregnancy, antepartum on their problem list.  Patient reports no complaints.  Contractions: Not present. Vag. Bleeding: None.  Movement: Present. Denies leaking of fluid.   The following portions of the patient's history were reviewed and updated as appropriate: allergies, current medications, past family history, past medical history, past social history, past surgical history and problem list.   Objective:   Vitals:   03/08/22 1330  BP: 109/70  Pulse: 88  Weight: 175 lb 8 oz (79.6 kg)    Fetal Status: Fetal Heart Rate (bpm): 143 Fundal Height: 24 cm Movement: Present     General:  Alert, oriented and cooperative. Patient is in no acute distress.  Skin: Skin is warm and dry. No rash noted.   Cardiovascular: Normal heart rate noted  Respiratory: Normal respiratory effort, no problems with respiration noted  Abdomen: Soft, gravid, appropriate for gestational age.  Pain/Pressure: Absent     Pelvic: Cervical exam deferred        Extremities: Normal range of motion.  Edema: None  Mental Status: Normal mood and affect. Normal behavior. Normal judgment and thought content.   Assessment and Plan:  Pregnancy: G3P1011 at [redacted]w[redacted]d 1. Supervision of other normal pregnancy, antepartum Patient is doing well without complaints Follow up anatomy ultrasound on 12/7 Third trimester labs and glcuola next visit  2. Obesity affecting pregnancy, antepartum, unspecified obesity type Rx ASA  provided  Preterm labor symptoms and general obstetric precautions including but not limited to vaginal bleeding, contractions, leaking of fluid and fetal movement were reviewed in detail with the patient. Please refer to After Visit Summary for other counseling recommendations.   Return in about 4 weeks (around 04/05/2022) for in person, ROB, Low risk, 2 hr glucola next visit.  Future Appointments  Date Time Provider Department Center  03/11/2022 11:15 AM [facility]SE [facility]  03/11/2022 11:30 AM [facility] US3 [facility]    Catalina Antigua, MD

## 2022-03-11 ENCOUNTER — Ambulatory Visit (HOSPITAL_BASED_OUTPATIENT_CLINIC_OR_DEPARTMENT_OTHER): Payer: Medicaid Other

## 2022-03-11 ENCOUNTER — Ambulatory Visit: Payer: Medicaid Other | Attending: Obstetrics and Gynecology | Admitting: *Deleted

## 2022-03-11 VITALS — BP 114/69 | HR 93

## 2022-03-11 DIAGNOSIS — O99891 Other specified diseases and conditions complicating pregnancy: Secondary | ICD-10-CM

## 2022-03-11 DIAGNOSIS — Z3A24 24 weeks gestation of pregnancy: Secondary | ICD-10-CM

## 2022-03-11 DIAGNOSIS — O99212 Obesity complicating pregnancy, second trimester: Secondary | ICD-10-CM | POA: Insufficient documentation

## 2022-03-11 DIAGNOSIS — Z362 Encounter for other antenatal screening follow-up: Secondary | ICD-10-CM | POA: Diagnosis not present

## 2022-03-11 DIAGNOSIS — Z148 Genetic carrier of other disease: Secondary | ICD-10-CM

## 2022-03-11 DIAGNOSIS — E669 Obesity, unspecified: Secondary | ICD-10-CM | POA: Diagnosis not present

## 2022-04-06 ENCOUNTER — Other Ambulatory Visit: Payer: Medicaid Other

## 2022-04-06 ENCOUNTER — Encounter: Payer: Self-pay | Admitting: Obstetrics

## 2022-04-06 ENCOUNTER — Ambulatory Visit (INDEPENDENT_AMBULATORY_CARE_PROVIDER_SITE_OTHER): Payer: Medicaid Other | Admitting: Obstetrics

## 2022-04-06 VITALS — BP 113/75 | HR 113 | Wt 184.2 lb

## 2022-04-06 DIAGNOSIS — Z23 Encounter for immunization: Secondary | ICD-10-CM

## 2022-04-06 DIAGNOSIS — Z3482 Encounter for supervision of other normal pregnancy, second trimester: Secondary | ICD-10-CM | POA: Diagnosis not present

## 2022-04-06 DIAGNOSIS — Z3A27 27 weeks gestation of pregnancy: Secondary | ICD-10-CM | POA: Diagnosis not present

## 2022-04-06 DIAGNOSIS — Z1332 Encounter for screening for maternal depression: Secondary | ICD-10-CM

## 2022-04-06 DIAGNOSIS — Z3480 Encounter for supervision of other normal pregnancy, unspecified trimester: Secondary | ICD-10-CM

## 2022-04-06 NOTE — Progress Notes (Signed)
Patient presents for ROB and GTT. Patient has no concerns today. TDAP vaccine given today.

## 2022-04-06 NOTE — Progress Notes (Signed)
Subjective:  Sara Higgins is a 32 y.o. G3P1011 at [redacted]w[redacted]d being seen today for ongoing prenatal care.  She is currently monitored for the following issues for this low-risk pregnancy and has Pap smear with atypical squamous cells, cannot exclude high grade squamous intraepithelial lesion (ASC-H); Screening examination for STD (sexually transmitted disease); Supervision of other normal pregnancy, antepartum; Heartburn during pregnancy in first trimester; and Obesity affecting pregnancy, antepartum on their problem list.  Patient reports heartburn.  Contractions: Irritability. Vag. Bleeding: None.  Movement: Present. Denies leaking of fluid.   The following portions of the patient's history were reviewed and updated as appropriate: allergies, current medications, past family history, past medical history, past social history, past surgical history and problem list. Problem list updated.  Objective:   Vitals:   04/06/22 0857  BP: 113/75  Pulse: (!) 113  Weight: 184 lb 3.2 oz (83.6 kg)    Fetal Status: Fetal Heart Rate (bpm): 150   Movement: Present     General:  Alert, oriented and cooperative. Patient is in no acute distress.  Skin: Skin is warm and dry. No rash noted.   Cardiovascular: Normal heart rate noted  Respiratory: Normal respiratory effort, no problems with respiration noted  Abdomen: Soft, gravid, appropriate for gestational age. Pain/Pressure: Absent     Pelvic:  Cervical exam deferred        Extremities: Normal range of motion.  Edema: None  Mental Status: Normal mood and affect. Normal behavior. Normal judgment and thought content.   Urinalysis:      Assessment and Plan:  Pregnancy: G3P1011 at [redacted]w[redacted]d  1. Supervision of other normal pregnancy, antepartum  2. [redacted] weeks gestation of pregnancy : - Glucose Tolerance, 2 Hours w/1 Hour - CBC - HIV Antibody (routine testing w rflx) - RPR - Tdap vaccine greater than or equal to 7yo IM  Preterm labor symptoms and  general obstetric precautions including but not limited to vaginal bleeding, contractions, leaking of fluid and fetal movement were reviewed in detail with the patient. Please refer to After Visit Summary for other counseling recommendations.   Return in about 2 weeks (around 04/20/2022) for ROB.   Shelly Bombard, MD 04/06/2022

## 2022-04-07 ENCOUNTER — Other Ambulatory Visit: Payer: Self-pay | Admitting: Obstetrics

## 2022-04-07 DIAGNOSIS — O24410 Gestational diabetes mellitus in pregnancy, diet controlled: Secondary | ICD-10-CM

## 2022-04-07 LAB — CBC
Hematocrit: 37.1 % (ref 34.0–46.6)
Hemoglobin: 11.7 g/dL (ref 11.1–15.9)
MCH: 25.4 pg — ABNORMAL LOW (ref 26.6–33.0)
MCHC: 31.5 g/dL (ref 31.5–35.7)
MCV: 81 fL (ref 79–97)
Platelets: 287 10*3/uL (ref 150–450)
RBC: 4.61 x10E6/uL (ref 3.77–5.28)
RDW: 12.6 % (ref 11.7–15.4)
WBC: 12 10*3/uL — ABNORMAL HIGH (ref 3.4–10.8)

## 2022-04-07 LAB — GLUCOSE TOLERANCE, 2 HOURS W/ 1HR
Glucose, 1 hour: 191 mg/dL — ABNORMAL HIGH (ref 70–179)
Glucose, 2 hour: 150 mg/dL (ref 70–152)
Glucose, Fasting: 79 mg/dL (ref 70–91)

## 2022-04-07 LAB — HIV ANTIBODY (ROUTINE TESTING W REFLEX): HIV Screen 4th Generation wRfx: NONREACTIVE

## 2022-04-07 LAB — RPR: RPR Ser Ql: NONREACTIVE

## 2022-04-08 ENCOUNTER — Other Ambulatory Visit: Payer: Self-pay | Admitting: Emergency Medicine

## 2022-04-08 ENCOUNTER — Telehealth: Payer: Self-pay | Admitting: Emergency Medicine

## 2022-04-08 NOTE — Telephone Encounter (Signed)
Pt informed of Gtt results.  Pt scheduled for diabetes education class. Diabetic supplies sent to pharmacy.

## 2022-04-08 NOTE — Telephone Encounter (Signed)
-----   Message from Shelly Bombard, MD sent at 04/07/2022 11:32 AM EST ----- GDM.  Please start Diabetic Teaching and order supplies for self-glucose testing.

## 2022-04-14 ENCOUNTER — Encounter: Payer: Medicaid Other | Attending: Obstetrics | Admitting: Registered"

## 2022-04-14 ENCOUNTER — Encounter: Payer: Self-pay | Admitting: Registered"

## 2022-04-14 ENCOUNTER — Other Ambulatory Visit: Payer: Self-pay

## 2022-04-14 DIAGNOSIS — O24419 Gestational diabetes mellitus in pregnancy, unspecified control: Secondary | ICD-10-CM | POA: Diagnosis not present

## 2022-04-14 MED ORDER — ACCU-CHEK GUIDE W/DEVICE KIT: 1.0000 | PACK | Freq: Four times a day (QID) | 0 refills | Status: DC

## 2022-04-14 MED ORDER — ACCU-CHEK SOFTCLIX LANCETS MISC: 1.0000 | Freq: Four times a day (QID) | 12 refills | Status: DC

## 2022-04-14 MED ORDER — ACCU-CHEK GUIDE VI STRP: 1.0000 | ORAL_STRIP | Freq: Four times a day (QID) | 12 refills | Status: DC

## 2022-04-14 NOTE — Progress Notes (Signed)
The following learning objectives were met by the patient during this course:   States the definition of Gestational Diabetes States why dietary management is important in controlling blood glucose Describes the effects each nutrient has on blood glucose levels Demonstrates ability to create a balanced meal plan Demonstrates carbohydrate counting  States when to check blood glucose levels Demonstrates proper blood glucose monitoring techniques States the effect of stress and exercise on blood glucose levels States the importance of limiting caffeine and abstaining from alcohol and smoking   Blood glucose monitor given: None, used demonstration meter to teach how to used Accu chek guide meter   Patient instructed to monitor glucose levels: FBS: 60 - <95; 1 hour: <140; 2 hour: <120   Patient received handouts: Nutrition Diabetes and Pregnancy, including carb counting list Glucose log sheet   Patient will be seen for follow-up as needed.

## 2022-04-20 ENCOUNTER — Ambulatory Visit (INDEPENDENT_AMBULATORY_CARE_PROVIDER_SITE_OTHER): Payer: Medicaid Other | Admitting: Advanced Practice Midwife

## 2022-04-20 ENCOUNTER — Encounter: Payer: Self-pay | Admitting: Advanced Practice Midwife

## 2022-04-20 ENCOUNTER — Other Ambulatory Visit (HOSPITAL_COMMUNITY)
Admission: RE | Admit: 2022-04-20 | Discharge: 2022-04-20 | Disposition: A | Discharge status: Home / Self Care | Payer: Medicaid Other | Source: Ambulatory Visit | Attending: Advanced Practice Midwife | Admitting: Advanced Practice Midwife

## 2022-04-20 VITALS — BP 108/70 | HR 91 | Wt 182.0 lb

## 2022-04-20 DIAGNOSIS — N898 Other specified noninflammatory disorders of vagina: Secondary | ICD-10-CM

## 2022-04-20 DIAGNOSIS — O24410 Gestational diabetes mellitus in pregnancy, diet controlled: Secondary | ICD-10-CM

## 2022-04-20 DIAGNOSIS — Z3A29 29 weeks gestation of pregnancy: Secondary | ICD-10-CM

## 2022-04-20 DIAGNOSIS — O26893 Other specified pregnancy related conditions, third trimester: Secondary | ICD-10-CM

## 2022-04-20 DIAGNOSIS — R519 Headache, unspecified: Secondary | ICD-10-CM

## 2022-04-20 DIAGNOSIS — Z3480 Encounter for supervision of other normal pregnancy, unspecified trimester: Secondary | ICD-10-CM

## 2022-04-20 DIAGNOSIS — Z3483 Encounter for supervision of other normal pregnancy, third trimester: Secondary | ICD-10-CM

## 2022-04-20 LAB — POCT URINALYSIS DIPSTICK
Bilirubin, UA: NEGATIVE
Blood, UA: NEGATIVE
Glucose, UA: NEGATIVE
Ketones, UA: NEGATIVE
Leukocytes, UA: NEGATIVE
Nitrite, UA: NEGATIVE
Odor: NEGATIVE
Protein, UA: POSITIVE — AB
Spec Grav, UA: 1.015 (ref 1.010–1.025)
Urobilinogen, UA: 0.2 E.U./dL
pH, UA: 7 (ref 5.0–8.0)

## 2022-04-20 NOTE — Progress Notes (Signed)
   PRENATAL VISIT NOTE  Subjective:  Sara Higgins is a 32 y.o. G3P1011 at [redacted]w[redacted]d being seen today for ongoing prenatal care.  She is currently monitored for the following issues for this low-risk pregnancy and has Pap smear with atypical squamous cells, cannot exclude high grade squamous intraepithelial lesion (ASC-H); Screening examination for STD (sexually transmitted disease); Supervision of other normal pregnancy, antepartum; Heartburn during pregnancy in first trimester; Obesity affecting pregnancy, antepartum; and Gestational diabetes mellitus (GDM), antepartum on their problem list.  Patient reports vaginal irritation.  Contractions: Not present. Vag. Bleeding: None.  Movement: Present. Denies leaking of fluid.   The following portions of the patient's history were reviewed and updated as appropriate: allergies, current medications, past family history, past medical history, past social history, past surgical history and problem list.   Objective:   Vitals:   04/20/22 1053  BP: 108/70  Pulse: 91  Weight: 182 lb (82.6 kg)    Fetal Status: Fetal Heart Rate (bpm): 154   Movement: Present     General:  Alert, oriented and cooperative. Patient is in no acute distress.  Skin: Skin is warm and dry. No rash noted.   Cardiovascular: Normal heart rate noted  Respiratory: Normal respiratory effort, no problems with respiration noted  Abdomen: Soft, gravid, appropriate for gestational age.  Pain/Pressure: Absent     Pelvic: Cervical exam deferred        Extremities: Normal range of motion.  Edema: None  Mental Status: Normal mood and affect. Normal behavior. Normal judgment and thought content.   Assessment and Plan:  Pregnancy: G3P1011 at [redacted]w[redacted]d 1. Supervision of other normal pregnancy, antepartum --Anticipatory guidance about next visits/weeks of pregnancy given.  - POCT Urinalysis Dipstick  2. Vaginal discharge during pregnancy in third trimester  - Cervicovaginal ancillary  only( [place])  3. Diet controlled White classification A1 gestational diabetes mellitus (GDM) --Reviewed glucose log, fastings all below 90, 2 out of 12 PP above range at 138 and 140. --Pt restricting calories with new GDM diet changes so discussed adding more protein and vegetables  - POCT Urinalysis Dipstick - [place] MFM OB FOLLOW UP; Future  4. Headache in pregnancy, antepartum, third trimester --BP grossly  normal -Pt is restricting calories to follow GDM diet.  She reports being hungry but not knowing what she can eat. Encouraged to eat more protein and keep drinking plenty of water. --F/U if h/a persist   Preterm labor symptoms and general obstetric precautions including but not limited to vaginal bleeding, contractions, leaking of fluid and fetal movement were reviewed in detail with the patient. Please refer to After Visit Summary for other counseling recommendations.   Return in about 2 weeks (around 05/04/2022) for LOB, Any provider.  Future Appointments  Date Time Provider [place]  05/04/2022 10:35 AM [facility]-Kirby, Kathie Dike, CNM [facility] None  05/18/2022 10:55 AM Leftwich-Kirby, Kathie Dike, CNM [facility] None  06/01/2022 10:35 AM Leftwich-Kirby, Kathie Dike, CNM [facility] None    Fatima Blank, CNM

## 2022-04-20 NOTE — Progress Notes (Signed)
Pt presents ROB reports HA's and visual disturbances x 1 week. Trace protein in UA Pt also reports vaginal itching, burning, and irritation. She requests to do self swab for all STD's.  CBG readings available per pt

## 2022-04-21 LAB — CERVICOVAGINAL ANCILLARY ONLY
Bacterial Vaginitis (gardnerella): NEGATIVE
Candida Glabrata: NEGATIVE
Candida Vaginitis: NEGATIVE
Chlamydia: NEGATIVE
Comment: NEGATIVE
Comment: NEGATIVE
Comment: NEGATIVE
Comment: NEGATIVE
Comment: NEGATIVE
Comment: NORMAL
Neisseria Gonorrhea: NEGATIVE
Trichomonas: NEGATIVE

## 2022-04-22 ENCOUNTER — Ambulatory Visit: Payer: Medicaid Other | Attending: Advanced Practice Midwife

## 2022-04-22 ENCOUNTER — Ambulatory Visit: Payer: Medicaid Other

## 2022-04-22 VITALS — BP 124/78 | HR 119

## 2022-04-22 DIAGNOSIS — O99210 Obesity complicating pregnancy, unspecified trimester: Secondary | ICD-10-CM

## 2022-04-22 DIAGNOSIS — D563 Thalassemia minor: Secondary | ICD-10-CM

## 2022-04-22 DIAGNOSIS — Z3A30 30 weeks gestation of pregnancy: Secondary | ICD-10-CM | POA: Diagnosis not present

## 2022-04-22 DIAGNOSIS — O285 Abnormal chromosomal and genetic finding on antenatal screening of mother: Secondary | ICD-10-CM

## 2022-04-22 DIAGNOSIS — O24419 Gestational diabetes mellitus in pregnancy, unspecified control: Secondary | ICD-10-CM | POA: Insufficient documentation

## 2022-04-22 DIAGNOSIS — E669 Obesity, unspecified: Secondary | ICD-10-CM

## 2022-04-22 DIAGNOSIS — Z3480 Encounter for supervision of other normal pregnancy, unspecified trimester: Secondary | ICD-10-CM | POA: Diagnosis present

## 2022-04-22 DIAGNOSIS — O24410 Gestational diabetes mellitus in pregnancy, diet controlled: Secondary | ICD-10-CM | POA: Insufficient documentation

## 2022-04-22 DIAGNOSIS — O99213 Obesity complicating pregnancy, third trimester: Secondary | ICD-10-CM | POA: Insufficient documentation

## 2022-04-23 ENCOUNTER — Other Ambulatory Visit: Payer: Self-pay | Admitting: *Deleted

## 2022-04-23 DIAGNOSIS — O24410 Gestational diabetes mellitus in pregnancy, diet controlled: Secondary | ICD-10-CM

## 2022-05-04 ENCOUNTER — Ambulatory Visit (INDEPENDENT_AMBULATORY_CARE_PROVIDER_SITE_OTHER): Payer: Medicaid Other

## 2022-05-04 VITALS — BP 110/70 | HR 94 | Wt 183.0 lb

## 2022-05-04 DIAGNOSIS — O24410 Gestational diabetes mellitus in pregnancy, diet controlled: Secondary | ICD-10-CM

## 2022-05-04 DIAGNOSIS — Z3A31 31 weeks gestation of pregnancy: Secondary | ICD-10-CM

## 2022-05-04 DIAGNOSIS — Z3480 Encounter for supervision of other normal pregnancy, unspecified trimester: Secondary | ICD-10-CM

## 2022-05-04 NOTE — Progress Notes (Signed)
Pt presents for ROB visit. Pt reports fasting cbg 75-80. 2 hrs after meals is 120-125. Pt states she is having an increase in braxton hicks, and denies adequate water intake. I informed pt to increase her water intake to help with contractions. No other concerns at this time.

## 2022-05-04 NOTE — Progress Notes (Signed)
   PRENATAL VISIT NOTE  Subjective:  Sara Higgins is a 32 y.o. G3P1011 at [redacted]w[redacted]d being seen today for ongoing prenatal care.  She is currently monitored for the following issues for this high-risk pregnancy and has Pap smear with atypical squamous cells, cannot exclude high grade squamous intraepithelial lesion (ASC-H); Screening examination for STD (sexually transmitted disease); Supervision of other normal pregnancy, antepartum; Heartburn during pregnancy in first trimester; Obesity affecting pregnancy, antepartum; and Gestational diabetes mellitus (GDM), antepartum on their problem list.  Patient reports  increased braxton hicks. She states she mostly drinks soda but tries to drink water .  Contractions: Irritability. Vag. Bleeding: None.  Movement: Present. Denies leaking of fluid.   The following portions of the patient's history were reviewed and updated as appropriate: allergies, current medications, past family history, past medical history, past social history, past surgical history and problem list.   Objective:   Vitals:   05/04/22 1059  BP: 110/70  Pulse: 94  Weight: 183 lb (83 kg)    Fetal Status: Fetal Heart Rate (bpm): 147 Fundal Height: 33 cm Movement: Present     General:  Alert, oriented and cooperative. Patient is in no acute distress.  Skin: Skin is warm and dry. No rash noted.   Cardiovascular: Normal heart rate noted  Respiratory: Normal respiratory effort, no problems with respiration noted  Abdomen: Soft, gravid, appropriate for gestational age.  Pain/Pressure: Present     Pelvic: Cervical exam deferred        Extremities: Normal range of motion.  Edema: None  Mental Status: Normal mood and affect. Normal behavior. Normal judgment and thought content.   Assessment and Plan:  Pregnancy: G3P1011 at [redacted]w[redacted]d 1. Supervision of other normal pregnancy, antepartum -Discussed importance of PO hydration to prevent braxton hicks. Options to add fruit or flavors to  water reviewed.  2. [redacted] weeks gestation of pregnancy   3. Diet controlled gestational diabetes mellitus (GDM), antepartum -Fastings all less than 90 and one PP 128. Good control and reviewed decreasing sodas to help -Repeat growth 2/29  Preterm labor symptoms and general obstetric precautions including but not limited to vaginal bleeding, contractions, leaking of fluid and fetal movement were reviewed in detail with the patient. Please refer to After Visit Summary for other counseling recommendations.   Return in about 2 weeks (around 05/18/2022) for Return OB visit.  Future Appointments  Date Time Provider [place]  05/18/2022 10:55 AM Leftwich-Kirby, Kathie Dike, CNM [facility] None  06/01/2022 10:35 AM Leftwich-Kirby, Kathie Dike, CNM [facility] None  06/03/2022  9:30 AM [facility] NURSE [facility]  06/03/2022  9:45 AM [facility] US7 [facility] [place], CNM

## 2022-05-11 ENCOUNTER — Telehealth: Payer: Self-pay

## 2022-05-11 NOTE — Telephone Encounter (Signed)
Patient called stating that she has been having lower abdominal cramps that her been present since last night around 10pm. Patient states that the cramps are not unbearable. She states that pain is about a 3-4/10. Cramping is unrelieved with water and rest. Pain is consistent. She is feeling baby move well. Denies bleeding and LOF.   Patient advised to try taking some tylenol, increase water intake, and rest as much as possible today. If sx worsen, advised to go to MAU to be evaluated. Advised to also be evaluated if she has decreased fetal movement, bleeding, LOF, or contraction pattern of every 5,1,1.

## 2022-05-15 ENCOUNTER — Inpatient Hospital Stay (HOSPITAL_COMMUNITY)
Admission: AD | Admit: 2022-05-15 | Discharge: 2022-05-16 | Disposition: A | Discharge status: Home / Self Care | Payer: Medicaid Other | Attending: Obstetrics & Gynecology | Admitting: Obstetrics & Gynecology

## 2022-05-15 ENCOUNTER — Encounter (HOSPITAL_COMMUNITY): Payer: Self-pay | Admitting: Obstetrics & Gynecology

## 2022-05-15 DIAGNOSIS — O479 False labor, unspecified: Secondary | ICD-10-CM

## 2022-05-15 DIAGNOSIS — Z3A33 33 weeks gestation of pregnancy: Secondary | ICD-10-CM | POA: Insufficient documentation

## 2022-05-15 DIAGNOSIS — O4703 False labor before 37 completed weeks of gestation, third trimester: Secondary | ICD-10-CM | POA: Insufficient documentation

## 2022-05-15 LAB — URINALYSIS, ROUTINE W REFLEX MICROSCOPIC
Bilirubin Urine: NEGATIVE
Glucose, UA: NEGATIVE mg/dL
Ketones, ur: NEGATIVE mg/dL
Leukocytes,Ua: NEGATIVE
Nitrite: NEGATIVE
Protein, ur: 30 mg/dL — AB
Specific Gravity, Urine: 1.013 (ref 1.005–1.030)
pH: 6 (ref 5.0–8.0)

## 2022-05-15 LAB — WET PREP, GENITAL
Clue Cells Wet Prep HPF POC: NONE SEEN
Sperm: NONE SEEN
Trich, Wet Prep: NONE SEEN
WBC, Wet Prep HPF POC: <10 (ref <10)
Yeast Wet Prep HPF POC: NONE SEEN

## 2022-05-15 MED ORDER — LACTATED RINGERS IV BOLUS
1000.0000 mL | Freq: Once | INTRAVENOUS | Status: AC
  Administered 2022-05-15: 1000 mL via INTRAVENOUS

## 2022-05-15 NOTE — MAU Provider Note (Signed)
History     CSN: [id number]  Arrival date and time: 05/15/22 2207   None     Chief Complaint  Patient presents with   Contractions   HPI Sara Voltz. Benko is a 32 y.o. G3P1011 at 71w2dwho presents to MAU for contractions. She reports ongoing contractions all week, however they worsened today. She reports contractions are about every 5 minutes apart. She denies leaking fluid or vaginal bleeding. No recent IC or urinary s/s. She endorses active fetal movement.   OB History     Gravida  3   Para  1   Term  1   Preterm      AB  1   Living  1      SAB      IAB  0   Ectopic      Multiple      Live Births              Past Medical History:  Diagnosis Date   Acid reflux    History of chlamydia 07/2014   History of trichomoniasis 07/2014   Vaginal Pap smear, abnormal     Past Surgical History:  Procedure Laterality Date   WISDOM TOOTH EXTRACTION      Family History  Problem Relation Age of Onset   Diabetes Paternal Grandmother    Hypertension Paternal Grandmother    Cancer Paternal Grandmother        Ovarian   Hypertension Other    Asthma Neg Hx    Heart disease Neg Hx    Stroke Neg Hx     Social History   Tobacco Use   Smoking status: Never   Smokeless tobacco: Never  Vaping Use   Vaping Use: Former   Substances: Nicotine, Flavoring  Substance Use Topics   Alcohol use: Not Currently   Drug use: Never    Allergies: No Known Allergies  Facility-Administered Medications Prior to Admission  Medication Dose Route Frequency Provider Last Rate Last Admin   clindamycin (CLEOCIN) 2 % vaginal cream 1 Applicatorful  1 Applicatorful Vaginal QHS AWoodroe Mode MD       Medications Prior to Admission  Medication Sig Dispense Refill Last Dose   famotidine (PEPCID) 20 MG tablet Take 1 tablet (20 mg total) by mouth 2 (two) times daily. 60 tablet 2 05/15/2022   Accu-Chek Softclix Lancets lancets 1 each by Other route 4 (four) times daily. Use  as instructed 100 each 12    aspirin EC 81 MG tablet Take 1 tablet (81 mg total) by mouth daily. Take after 12 weeks for prevention of preeclampsia later in pregnancy (Patient not taking: Reported on 03/11/2022) 300 tablet 2    Blood Glucose Monitoring Suppl (ACCU-CHEK GUIDE) w/Device KIT 1 Device by Does not apply route 4 (four) times daily. 1 kit 0    cyclobenzaprine (FLEXERIL) 5 MG tablet Take 1-2 tablets (5-10 mg total) by mouth 3 (three) times daily as needed for muscle spasms. (Patient not taking: Reported on 03/08/2022) 20 tablet 0    Doxylamine-Pyridoxine (DICLEGIS) 10-10 MG TBEC Take 2 tablets by mouth at bedtime. May add 1 tablet at breakfast and 1 tablet at lunch if needed (Patient not taking: Reported on 02/04/2022) 100 tablet 2    glucose blood (ACCU-CHEK GUIDE) test strip 1 each by Other route 4 (four) times daily. Use as instructed 100 each 12    Prenatal MV & Min w/FA-DHA (PRENATAL ADULT GUMMY/DHA/FA) 0.4-25 MG CHEW Chew 1 Units by mouth  daily. 90 tablet 2    Review of Systems  Gastrointestinal:  Positive for abdominal pain (contractions).  All other systems reviewed and are negative.  Physical Exam   Blood pressure 117/73, pulse 84, temperature 98.1 F (36.7 C), resp. rate 17, height 5' 1"$  (1.549 m), weight 86.3 kg, last menstrual period 09/24/2021, SpO2 100 %.  Physical Exam Vitals and nursing note reviewed. Exam conducted with a chaperone present.  Constitutional:      General: She is not in acute distress. Eyes:     Extraocular Movements: Extraocular movements intact.     Pupils: Pupils are equal, round, and reactive to light.  Cardiovascular:     Rate and Rhythm: Tachycardia present.  Pulmonary:     Effort: Pulmonary effort is normal.  Abdominal:     Palpations: Abdomen is soft.     Tenderness: There is no abdominal tenderness.     Comments: Gravid   Musculoskeletal:        General: Normal range of motion.     Cervical back: Normal range of motion.  Skin:     General: Skin is warm and dry.  Neurological:     General: No focal deficit present.     Mental Status: She is alert and oriented to person, place, and time.  Psychiatric:        Mood and Affect: Mood normal.        Behavior: Behavior normal.     Dilation: Closed Exam by:: Maryagnes Amos, CNM  Results for orders placed or performed during the hospital encounter of 05/15/22 (from the past 24 hour(s))  Urinalysis, Routine w reflex microscopic -Urine, Clean Catch     Status: Abnormal   Collection Time: 05/15/22 10:38 PM  Result Value Ref Range   Color, Urine YELLOW YELLOW   APPearance CLEAR CLEAR   Specific Gravity, Urine 1.013 1.005 - 1.030   pH 6.0 5.0 - 8.0   Glucose, UA NEGATIVE NEGATIVE mg/dL   Hgb urine dipstick SMALL (A) NEGATIVE   Bilirubin Urine NEGATIVE NEGATIVE   Ketones, ur NEGATIVE NEGATIVE mg/dL   Protein, ur 30 (A) NEGATIVE mg/dL   Nitrite NEGATIVE NEGATIVE   Leukocytes,Ua NEGATIVE NEGATIVE   RBC / HPF 0-5 0 - 5 RBC/hpf   WBC, UA 0-5 0 - 5 WBC/hpf   Bacteria, UA RARE (A) NONE SEEN   Squamous Epithelial / HPF 0-5 0 - 5 /HPF   Mucus PRESENT   Wet prep, genital     Status: None   Collection Time: 05/15/22 11:13 PM   Specimen: Vaginal  Result Value Ref Range   Yeast Wet Prep HPF POC NONE SEEN NONE SEEN   Trich, Wet Prep NONE SEEN NONE SEEN   Clue Cells Wet Prep HPF POC NONE SEEN NONE SEEN   WBC, Wet Prep HPF POC <10 <10   Sperm NONE SEEN    NST FHR: 140 bpm, moderate variability, +15x15 accels, no decels Toco: occasional  MAU Course  Procedures  MDM UA, culture Wet prep, GC/CT LR bolus  UA positive for Hgb and bacteria, will add culture. Wet prep negative. GC/CT pending. Cervix closed and remains closed after 1.5 hours. Discussed PO hydration vs IV hydration vs Procardia. Patient opts for IVFs. On reassessment, patient reports contractions have spaced out and improved. NST reassuring for gestational age. Low suspicion for active preterm labor.    Assessment and Plan   1. [redacted] weeks gestation of pregnancy   2. Braxton Hick's contraction    - Discharge  home in stable condition - Keep OB appointment as scheduled on 2/13 - Return to MAU as needed for new/worsening symptoms - Return precautions given  Renee Harder, CNM 05/16/2022, 12:46 AM

## 2022-05-15 NOTE — MAU Note (Signed)
..  Sara Higgins is a 32 y.o. at [redacted]w[redacted]d here in MAU reporting: contractions all day that are now 5 minutes apart. Denies vaginal bleeding or leaking of fluid. +FM   Pain score: 6/10  [id number] in room  Lab orders placed from triage:  UA

## 2022-05-15 NOTE — MAU Provider Note (Incomplete)
History     CSN: [id number]  Arrival date and time: 05/15/22 2207   None     Chief Complaint  Patient presents with  . Contractions   HPI Sara Higgins is a 32 y.o. G3P1011 at 24w2dwho presents to MAU for contractions. She reports ongoing contractions all week, however they worsened today. She reports contractions are about every 5 minutes apart. She denies leaking fluid or vaginal bleeding. No recent IC or urinary s/s. She endorses active fetal movement.   OB History     Gravida  3   Para  1   Term  1   Preterm      AB  1   Living  1      SAB      IAB  0   Ectopic      Multiple      Live Births              Past Medical History:  Diagnosis Date  . Acid reflux   . History of chlamydia 07/2014  . History of trichomoniasis 07/2014  . Vaginal Pap smear, abnormal     Past Surgical History:  Procedure Laterality Date  . WISDOM TOOTH EXTRACTION      Family History  Problem Relation Age of Onset  . Diabetes Paternal Grandmother   . Hypertension Paternal Grandmother   . Cancer Paternal Grandmother        Ovarian  . Hypertension Other   . Asthma Neg Hx   . Heart disease Neg Hx   . Stroke Neg Hx     Social History   Tobacco Use  . Smoking status: Never  . Smokeless tobacco: Never  Vaping Use  . Vaping Use: Former  . Substances: Nicotine, Flavoring  Substance Use Topics  . Alcohol use: Not Currently  . Drug use: Never    Allergies: No Known Allergies  Facility-Administered Medications Prior to Admission  Medication Dose Route Frequency Provider Last Rate Last Admin  . clindamycin (CLEOCIN) 2 % vaginal cream 1 Applicatorful  1 Applicatorful Vaginal QHS AWoodroe Mode MD       Medications Prior to Admission  Medication Sig Dispense Refill Last Dose  . famotidine (PEPCID) 20 MG tablet Take 1 tablet (20 mg total) by mouth 2 (two) times daily. 60 tablet 2 05/15/2022  . Accu-Chek Softclix Lancets lancets 1 each by Other route 4  (four) times daily. Use as instructed 100 each 12   . aspirin EC 81 MG tablet Take 1 tablet (81 mg total) by mouth daily. Take after 12 weeks for prevention of preeclampsia later in pregnancy (Patient not taking: Reported on 03/11/2022) 300 tablet 2   . Blood Glucose Monitoring Suppl (ACCU-CHEK GUIDE) w/Device KIT 1 Device by Does not apply route 4 (four) times daily. 1 kit 0   . cyclobenzaprine (FLEXERIL) 5 MG tablet Take 1-2 tablets (5-10 mg total) by mouth 3 (three) times daily as needed for muscle spasms. (Patient not taking: Reported on 03/08/2022) 20 tablet 0   . Doxylamine-Pyridoxine (DICLEGIS) 10-10 MG TBEC Take 2 tablets by mouth at bedtime. May add 1 tablet at breakfast and 1 tablet at lunch if needed (Patient not taking: Reported on 02/04/2022) 100 tablet 2   . glucose blood (ACCU-CHEK GUIDE) test strip 1 each by Other route 4 (four) times daily. Use as instructed 100 each 12   . Prenatal MV & Min w/FA-DHA (PRENATAL ADULT GUMMY/DHA/FA) 0.4-25 MG CHEW Chew 1 Units by mouth  daily. 90 tablet 2    Review of Systems  Gastrointestinal:  Positive for abdominal pain (contractions).  All other systems reviewed and are negative.  Physical Exam   Blood pressure 131/89, pulse (!) 112, resp. rate 17, height 5' 1"$  (1.549 m), weight 86.3 kg, last menstrual period 09/24/2021, SpO2 99 %.  Physical Exam Vitals and nursing note reviewed. Exam conducted with a chaperone present.  Constitutional:      General: She is not in acute distress. Eyes:     Extraocular Movements: Extraocular movements intact.     Pupils: Pupils are equal, round, and reactive to light.  Cardiovascular:     Rate and Rhythm: Tachycardia present.  Pulmonary:     Effort: Pulmonary effort is normal.  Abdominal:     Palpations: Abdomen is soft.     Tenderness: There is no abdominal tenderness.     Comments: Gravid   Musculoskeletal:        General: Normal range of motion.     Cervical back: Normal range of motion.  Skin:     General: Skin is warm and dry.  Neurological:     General: No focal deficit present.     Mental Status: She is alert and oriented to person, place, and time.  Psychiatric:        Mood and Affect: Mood normal.        Behavior: Behavior normal.     Dilation: Closed Exam by:: Maryagnes Amos CNM  Results for orders placed or performed during the hospital encounter of 05/15/22 (from the past 24 hour(s))  Urinalysis, Routine w reflex microscopic -Urine, Clean Catch     Status: Abnormal   Collection Time: 05/15/22 10:38 PM  Result Value Ref Range   Color, Urine YELLOW YELLOW   APPearance CLEAR CLEAR   Specific Gravity, Urine 1.013 1.005 - 1.030   pH 6.0 5.0 - 8.0   Glucose, UA NEGATIVE NEGATIVE mg/dL   Hgb urine dipstick SMALL (A) NEGATIVE   Bilirubin Urine NEGATIVE NEGATIVE   Ketones, ur NEGATIVE NEGATIVE mg/dL   Protein, ur 30 (A) NEGATIVE mg/dL   Nitrite NEGATIVE NEGATIVE   Leukocytes,Ua NEGATIVE NEGATIVE   RBC / HPF 0-5 0 - 5 RBC/hpf   WBC, UA 0-5 0 - 5 WBC/hpf   Bacteria, UA RARE (A) NONE SEEN   Squamous Epithelial / HPF 0-5 0 - 5 /HPF   Mucus PRESENT   Wet prep, genital     Status: None   Collection Time: 05/15/22 11:13 PM   Specimen: Vaginal  Result Value Ref Range   Yeast Wet Prep HPF POC NONE SEEN NONE SEEN   Trich, Wet Prep NONE SEEN NONE SEEN   Clue Cells Wet Prep HPF POC NONE SEEN NONE SEEN   WBC, Wet Prep HPF POC <10 <10   Sperm NONE SEEN    NST FHR: Toco:  MAU Course  Procedures  MDM UA, culture Wet prep, GC/CT LR bolus  ***  Assessment and Plan  ***  Renee Harder 05/15/2022, 11:46 PM

## 2022-05-16 DIAGNOSIS — O479 False labor, unspecified: Secondary | ICD-10-CM

## 2022-05-16 DIAGNOSIS — Z3A33 33 weeks gestation of pregnancy: Secondary | ICD-10-CM

## 2022-05-16 LAB — CULTURE, OB URINE

## 2022-05-17 LAB — GC/CHLAMYDIA PROBE AMP (~~LOC~~) NOT AT ARMC
Chlamydia: NEGATIVE
Comment: NEGATIVE
Comment: NORMAL
Neisseria Gonorrhea: NEGATIVE

## 2022-05-18 ENCOUNTER — Ambulatory Visit (INDEPENDENT_AMBULATORY_CARE_PROVIDER_SITE_OTHER): Payer: Medicaid Other | Admitting: Obstetrics and Gynecology

## 2022-05-18 ENCOUNTER — Encounter: Payer: Self-pay | Admitting: Obstetrics and Gynecology

## 2022-05-18 VITALS — BP 117/75 | HR 85 | Wt 185.0 lb

## 2022-05-18 DIAGNOSIS — O99210 Obesity complicating pregnancy, unspecified trimester: Secondary | ICD-10-CM

## 2022-05-18 DIAGNOSIS — O24410 Gestational diabetes mellitus in pregnancy, diet controlled: Secondary | ICD-10-CM

## 2022-05-18 DIAGNOSIS — O99213 Obesity complicating pregnancy, third trimester: Secondary | ICD-10-CM

## 2022-05-18 DIAGNOSIS — Z3A33 33 weeks gestation of pregnancy: Secondary | ICD-10-CM

## 2022-05-18 DIAGNOSIS — Z3480 Encounter for supervision of other normal pregnancy, unspecified trimester: Secondary | ICD-10-CM

## 2022-05-18 NOTE — Progress Notes (Signed)
Pt presents for ROB visit. No concerns at this time.

## 2022-05-18 NOTE — Progress Notes (Signed)
   PRENATAL VISIT NOTE  Subjective:  Sara Higgins is a 32 y.o. G3P1011 at [redacted]w[redacted]d being seen today for ongoing prenatal care.  She is currently monitored for the following issues for this high-risk pregnancy and has Pap smear with atypical squamous cells, cannot exclude high grade squamous intraepithelial lesion (ASC-H); Screening examination for STD (sexually transmitted disease); Supervision of other normal pregnancy, antepartum; Heartburn during pregnancy in first trimester; Obesity affecting pregnancy, antepartum; and Gestational diabetes mellitus (GDM), antepartum on their problem list.  Patient reports no complaints.  Contractions: Irritability. Vag. Bleeding: None.  Movement: Present. Denies leaking of fluid.   The following portions of the patient's history were reviewed and updated as appropriate: allergies, current medications, past family history, past medical history, past social history, past surgical history and problem list.   Objective:   Vitals:   05/18/22 1106  BP: 117/75  Pulse: 85  Weight: 185 lb (83.9 kg)    Fetal Status: Fetal Heart Rate (bpm): 150 Fundal Height: 35 cm Movement: Present     General:  Alert, oriented and cooperative. Patient is in no acute distress.  Skin: Skin is warm and dry. No rash noted.   Cardiovascular: Normal heart rate noted  Respiratory: Normal respiratory effort, no problems with respiration noted  Abdomen: Soft, gravid, appropriate for gestational age.  Pain/Pressure: Absent     Pelvic: Cervical exam deferred        Extremities: Normal range of motion.  Edema: None  Mental Status: Normal mood and affect. Normal behavior. Normal judgment and thought content.   Assessment and Plan:  Pregnancy: G3P1011 at [redacted]w[redacted]d 1. Supervision of other normal pregnancy, antepartum Patient is doing well and is without complaints Cultures next visit  2. Diet controlled gestational diabetes mellitus (GDM), antepartum Patient did not bring CBG log  and reports highest fasting 75 and highest pp 117 Continue diet control Follow up growth ultrasound 2/29  3. Obesity affecting pregnancy, antepartum, unspecified obesity type Patient is not taking ASA  Preterm labor symptoms and general obstetric precautions including but not limited to vaginal bleeding, contractions, leaking of fluid and fetal movement were reviewed in detail with the patient. Please refer to After Visit Summary for other counseling recommendations.   Return in about 2 weeks (around 06/01/2022) for in person, ROB, High risk.  Future Appointments  Date Time Provider [place]  06/01/2022 10:35 AM Elvera Maria, CNM [facility] None  06/03/2022  9:30 AM [facility] NURSE [facility]  06/03/2022  9:45 AM [facility] US7 [facility]    Mora Bellman, MD

## 2022-05-20 LAB — URINE CULTURE, OB REFLEX

## 2022-05-20 LAB — CULTURE, OB URINE

## 2022-05-28 MED FILL — Lactated Ringer's Solution: INTRAVENOUS | Qty: 1000 | Status: AC

## 2022-06-01 ENCOUNTER — Ambulatory Visit (INDEPENDENT_AMBULATORY_CARE_PROVIDER_SITE_OTHER): Payer: Medicaid Other | Admitting: Advanced Practice Midwife

## 2022-06-01 VITALS — BP 119/76 | HR 97 | Wt 193.6 lb

## 2022-06-01 DIAGNOSIS — Z3A35 35 weeks gestation of pregnancy: Secondary | ICD-10-CM

## 2022-06-01 DIAGNOSIS — R102 Pelvic and perineal pain: Secondary | ICD-10-CM

## 2022-06-01 DIAGNOSIS — Z3480 Encounter for supervision of other normal pregnancy, unspecified trimester: Secondary | ICD-10-CM

## 2022-06-01 DIAGNOSIS — O24410 Gestational diabetes mellitus in pregnancy, diet controlled: Secondary | ICD-10-CM

## 2022-06-01 DIAGNOSIS — O26893 Other specified pregnancy related conditions, third trimester: Secondary | ICD-10-CM

## 2022-06-01 NOTE — Progress Notes (Signed)
   PRENATAL VISIT NOTE  Subjective:  Sara Higgins is a 32 y.o. G3P1011 at 11w5dbeing seen today for ongoing prenatal care.  She is currently monitored for the following issues for this low-risk pregnancy and has Pap smear with atypical squamous cells, cannot exclude high grade squamous intraepithelial lesion (ASC-H); Screening examination for STD (sexually transmitted disease); Supervision of other normal pregnancy, antepartum; Heartburn during pregnancy in first trimester; Obesity affecting pregnancy, antepartum; and Gestational diabetes mellitus (GDM), antepartum on their problem list.  Patient reports  pelvic pressure and intermittent contractions .  Contractions: Irritability. Vag. Bleeding: None.  Movement: Present. Denies leaking of fluid.   The following portions of the patient's history were reviewed and updated as appropriate: allergies, current medications, past family history, past medical history, past social history, past surgical history and problem list.   Objective:   Vitals:   06/01/22 1042  BP: 119/76  Pulse: 97  Weight: 193 lb 9.6 oz (87.8 kg)    Fetal Status: Fetal Heart Rate (bpm): 141 Fundal Height: 37 cm Movement: Present     General:  Alert, oriented and cooperative. Patient is in no acute distress.  Skin: Skin is warm and dry. No rash noted.   Cardiovascular: Normal heart rate noted  Respiratory: Normal respiratory effort, no problems with respiration noted  Abdomen: Soft, gravid, appropriate for gestational age.  Pain/Pressure: Present     Pelvic: Cervical exam performed in the presence of a chaperone Dilation: Fingertip Effacement (%): 50 Station: -3  Extremities: Normal range of motion.  Edema: Trace  Mental Status: Normal mood and affect. Normal behavior. Normal judgment and thought content.   Assessment and Plan:  Pregnancy: G3P1011 at 333w5d. Supervision of other normal pregnancy, antepartum --Anticipatory guidance about next visits/weeks of  pregnancy given.   2. Diet controlled gestational diabetes mellitus (GDM), antepartum --Reviewed glucose log, Fasting values all below 80, PP all within range --EFW 84% on 1/18, [place] 2/29 for growth --Pt first baby was 6.5 lbs --Discussed LGA and close watch of labor progression. If labor progresses slowly or stalls, a cesarean may be recommended.  Pt states understanding.    3. [redacted] weeks gestation of pregnancy   4. Pelvic pain affecting pregnancy in third trimester, antepartum --Rest/ice/heat/warm bath/increase PO fluids/Tylenol/pregnancy support belt   --No evidence of labor with cervix FT/50/-3  Preterm labor symptoms and general obstetric precautions including but not limited to vaginal bleeding, contractions, leaking of fluid and fetal movement were reviewed in detail with the patient. Please refer to After Visit Summary for other counseling recommendations.   Return in about 1 week (around 06/08/2022) for LOB.  Future Appointments  Date Time Provider [place]/29/2024  9:30 AM [facility]29/2024  9:45 AM [facility] US7 [facility]/08/2022 10:35 AM HaShelly BombardMD [facility]  06/15/2022 10:35 AM HaShelly BombardMD [facility]  06/23/2022 10:35 AM BaGriffin BasilMD [facility]  07/01/2022 10:35 AM ErChancy MilroyMD [facility]    LiFatima BlankCNM

## 2022-06-01 NOTE — Progress Notes (Signed)
Pt presents for ROB visit. No concerns at this time.

## 2022-06-01 NOTE — Patient Instructions (Signed)
Reasons to return to MAU at [place]:  Since you are preterm, return to MAU if:  1.  Contractions are 10 minutes apart or less and they becoming more uncomfortable or painful over time 2.  You have a large gush of fluid, or a trickle of fluid that will not stop and you have to wear a pad 3.  You have bleeding that is bright red, heavier than spotting--like menstrual bleeding (spotting can be normal in early labor or after a check of your cervix) 4.  You do not feel the baby moving like he/she normally does

## 2022-06-03 ENCOUNTER — Ambulatory Visit: Payer: Medicaid Other | Attending: Obstetrics

## 2022-06-03 ENCOUNTER — Ambulatory Visit: Payer: Medicaid Other | Admitting: *Deleted

## 2022-06-03 VITALS — BP 121/81 | HR 95

## 2022-06-03 DIAGNOSIS — D563 Thalassemia minor: Secondary | ICD-10-CM | POA: Diagnosis not present

## 2022-06-03 DIAGNOSIS — O99213 Obesity complicating pregnancy, third trimester: Secondary | ICD-10-CM

## 2022-06-03 DIAGNOSIS — E669 Obesity, unspecified: Secondary | ICD-10-CM | POA: Diagnosis not present

## 2022-06-03 DIAGNOSIS — O24410 Gestational diabetes mellitus in pregnancy, diet controlled: Secondary | ICD-10-CM

## 2022-06-03 DIAGNOSIS — Z3A36 36 weeks gestation of pregnancy: Secondary | ICD-10-CM
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
## 2022-06-08 ENCOUNTER — Other Ambulatory Visit (HOSPITAL_COMMUNITY)
Admission: RE | Admit: 2022-06-08 | Discharge: 2022-06-08 | Disposition: A | Discharge status: Home / Self Care | Payer: Medicaid Other | Source: Ambulatory Visit | Attending: Obstetrics | Admitting: Obstetrics

## 2022-06-08 ENCOUNTER — Encounter: Payer: Self-pay | Admitting: Obstetrics

## 2022-06-08 ENCOUNTER — Ambulatory Visit (INDEPENDENT_AMBULATORY_CARE_PROVIDER_SITE_OTHER): Payer: Medicaid Other | Admitting: Obstetrics

## 2022-06-08 VITALS — BP 116/78 | HR 97 | Wt 195.0 lb

## 2022-06-08 DIAGNOSIS — Z3480 Encounter for supervision of other normal pregnancy, unspecified trimester: Secondary | ICD-10-CM | POA: Insufficient documentation

## 2022-06-08 DIAGNOSIS — Z3483 Encounter for supervision of other normal pregnancy, third trimester: Secondary | ICD-10-CM

## 2022-06-08 DIAGNOSIS — Z3A36 36 weeks gestation of pregnancy: Secondary | ICD-10-CM | POA: Diagnosis not present

## 2022-06-08 DIAGNOSIS — O24410 Gestational diabetes mellitus in pregnancy, diet controlled: Secondary | ICD-10-CM

## 2022-06-08 NOTE — Progress Notes (Signed)
Subjective:  Sara Higgins is a 32 y.o. G3P1011 at 17w5dbeing seen today for ongoing prenatal care.  She is currently monitored for the following issues for this low-risk pregnancy and has Pap smear with atypical squamous cells, cannot exclude high grade squamous intraepithelial lesion (ASC-H); Screening examination for STD (sexually transmitted disease); Supervision of other normal pregnancy, antepartum; Heartburn during pregnancy in first trimester; Obesity affecting pregnancy, antepartum; and Gestational diabetes mellitus (GDM), antepartum on their problem list.  Patient reports no complaints.  Contractions: Irregular. Vag. Bleeding: None.  Movement: Present. Denies leaking of fluid.   The following portions of the patient's history were reviewed and updated as appropriate: allergies, current medications, past family history, past medical history, past social history, past surgical history and problem list. Problem list updated.  Objective:   Vitals:   06/08/22 1050  BP: 116/78  Pulse: 97  Weight: 195 lb (88.5 kg)    Fetal Status: Fetal Heart Rate (bpm): 140   Movement: Present     General:  Alert, oriented and cooperative. Patient is in no acute distress.  Skin: Skin is warm and dry. No rash noted.   Cardiovascular: Normal heart rate noted  Respiratory: Normal respiratory effort, no problems with respiration noted  Abdomen: Soft, gravid, appropriate for gestational age. Pain/Pressure: Present     Pelvic:  Cervical exam deferred        Extremities: Normal range of motion.  Edema: Trace  Mental Status: Normal mood and affect. Normal behavior. Normal judgment and thought content.   Urinalysis:      Assessment and Plan:  Pregnancy: G3P1011 at 344w5d1. Supervision of other normal pregnancy, antepartum Rx: - Culture, beta strep (group b only) - Cervicovaginal ancillary only( [place])  Term labor symptoms and general obstetric precautions including but not limited to  vaginal bleeding, contractions, leaking of fluid and fetal movement were reviewed in detail with the patient. Please refer to After Visit Summary for other counseling recommendations.   Return in about 1 week (around 06/15/2022) [place]  HaShelly BombardMD 06/08/22

## 2022-06-08 NOTE — Progress Notes (Signed)
ROB/GBS.  C/o 'being moist down there'  BS 75 Fasting / 2 hrs 118

## 2022-06-09 LAB — CERVICOVAGINAL ANCILLARY ONLY
Bacterial Vaginitis (gardnerella): NEGATIVE
Candida Glabrata: NEGATIVE
Candida Vaginitis: NEGATIVE
Chlamydia: NEGATIVE
Comment: NEGATIVE
Comment: NEGATIVE
Comment: NEGATIVE
Comment: NEGATIVE
Comment: NEGATIVE
Comment: NORMAL
Neisseria Gonorrhea: NEGATIVE
Trichomonas: NEGATIVE

## 2022-06-12 LAB — CULTURE, BETA STREP (GROUP B ONLY): Strep Gp B Culture: NEGATIVE

## 2022-06-15 ENCOUNTER — Inpatient Hospital Stay (HOSPITAL_COMMUNITY)
Admission: AD | Admit: 2022-06-15 | Discharge: 2022-06-15 | Disposition: A | Discharge status: Home / Self Care | Payer: Medicaid Other | Attending: Obstetrics & Gynecology | Admitting: Obstetrics & Gynecology

## 2022-06-15 ENCOUNTER — Encounter (HOSPITAL_COMMUNITY): Payer: Self-pay | Admitting: Obstetrics & Gynecology

## 2022-06-15 ENCOUNTER — Encounter: Payer: Medicaid Other | Admitting: Obstetrics

## 2022-06-15 DIAGNOSIS — O471 False labor at or after 37 completed weeks of gestation: Secondary | ICD-10-CM | POA: Diagnosis not present

## 2022-06-15 DIAGNOSIS — Z3689 Encounter for other specified antenatal screening: Secondary | ICD-10-CM

## 2022-06-15 DIAGNOSIS — Z3A37 37 weeks gestation of pregnancy: Secondary | ICD-10-CM

## 2022-06-15 DIAGNOSIS — O479 False labor, unspecified: Secondary | ICD-10-CM

## 2022-06-15 NOTE — MAU Provider Note (Signed)
S: Patient is here for RN labor evaluation. Fetal tracing, vital signs, & chart reviewed   O:  Vitals:   06/15/22 0259 06/15/22 0424  BP: 128/82 127/85  Pulse: 88 79  Resp:  20  Temp: 98.7 F (37.1 C) 98.1 F (36.7 C)  TempSrc:  Oral  SpO2: 99%    No results found for this or any previous visit (from the past 24 hour(s)).  Dilation: Fingertip   FHR: 145 bpm, Mod Var, - Decels, +15x15 Accels UC: Irritability   A: False Labor Cat I FT  P:  NST reactive No cervical change after evaluation.  RN to discharge home in stable condition with return precautions & fetal kick counts  Maryann Conners, CNM 4:25 AM

## 2022-06-15 NOTE — MAU Note (Addendum)
..  Sara Higgins is a 32 y.o. at [redacted]w[redacted]d here in MAU reporting: contractions every 5 minutes for the last hour. Denies vaginal bleeding or leaking of fluid. +FM

## 2022-06-17 ENCOUNTER — Encounter (HOSPITAL_COMMUNITY): Payer: Self-pay | Admitting: Obstetrics and Gynecology

## 2022-06-17 ENCOUNTER — Other Ambulatory Visit: Payer: Self-pay

## 2022-06-17 ENCOUNTER — Inpatient Hospital Stay (HOSPITAL_COMMUNITY)
Admission: AD | Admit: 2022-06-17 | Discharge: 2022-06-17 | Disposition: A | Discharge status: Home / Self Care | Payer: Medicaid Other | Attending: Obstetrics and Gynecology | Admitting: Obstetrics and Gynecology

## 2022-06-17 ENCOUNTER — Other Ambulatory Visit: Payer: Self-pay | Admitting: *Deleted

## 2022-06-17 DIAGNOSIS — Z3689 Encounter for other specified antenatal screening: Secondary | ICD-10-CM | POA: Diagnosis not present

## 2022-06-17 DIAGNOSIS — Z3480 Encounter for supervision of other normal pregnancy, unspecified trimester: Secondary | ICD-10-CM

## 2022-06-17 DIAGNOSIS — O1213 Gestational proteinuria, third trimester: Secondary | ICD-10-CM | POA: Diagnosis not present

## 2022-06-17 DIAGNOSIS — O99210 Obesity complicating pregnancy, unspecified trimester: Secondary | ICD-10-CM

## 2022-06-17 DIAGNOSIS — Z3A38 38 weeks gestation of pregnancy: Secondary | ICD-10-CM | POA: Diagnosis present

## 2022-06-17 DIAGNOSIS — R519 Headache, unspecified: Secondary | ICD-10-CM | POA: Diagnosis not present

## 2022-06-17 DIAGNOSIS — M7989 Other specified soft tissue disorders: Secondary | ICD-10-CM | POA: Diagnosis not present

## 2022-06-17 DIAGNOSIS — O26893 Other specified pregnancy related conditions, third trimester: Secondary | ICD-10-CM | POA: Diagnosis present

## 2022-06-17 DIAGNOSIS — Z91148 Patient's other noncompliance with medication regimen for other reason: Secondary | ICD-10-CM | POA: Diagnosis not present

## 2022-06-17 DIAGNOSIS — O99613 Diseases of the digestive system complicating pregnancy, third trimester: Secondary | ICD-10-CM | POA: Insufficient documentation

## 2022-06-17 LAB — URINALYSIS, ROUTINE W REFLEX MICROSCOPIC
Bilirubin Urine: NEGATIVE
Glucose, UA: NEGATIVE mg/dL
Hgb urine dipstick: NEGATIVE
Ketones, ur: NEGATIVE mg/dL
Nitrite: NEGATIVE
Protein, ur: 30 mg/dL — AB
Specific Gravity, Urine: 1.014 (ref 1.005–1.030)
pH: 6 (ref 5.0–8.0)

## 2022-06-17 LAB — GLUCOSE, CAPILLARY: Glucose-Capillary: 68 mg/dL — ABNORMAL LOW (ref 70–99)

## 2022-06-17 NOTE — MAU Provider Note (Signed)
History     CSN: [id number]  Arrival date and time: 06/17/22 1333   Event Date/Time   First Provider Initiated Contact with Patient 06/17/22 1430      Chief Complaint  Patient presents with   Headache   HPI Sara Higgins is a 32 y.o. G3P1011 at 70w0dwho presents to MAU with multiple concerns:   "Tight hands and feet" Patient endorses falling asleep earlier today. When she woke up her hands and feet were swollen and tight bilaterally. She denies change in function. She denies change in ability to ambulate independently or perform ADLs. She denies unilateral swelling or calf tenderness.   Headaches  Not present during MAU encounter. Patient reports "in and out" headache pain, sometimes accompanied by spots in her field of vision.She has not taken medication when her headaches occur because , does't believe in taking meds during pregnancy. Not present right now  Cramping "like a period" This is a new problem. Patient endorses mild abdominal cramping. She declines cervical exam in MAU.  A1GDM Patient states she was previously checking her CBGs 4 times per day but has recently discontinued regular checking. Prior to this they were all WNL, per patient report. 24 hour diet recall includes lamb chops with mashed potatoes and asparagus for dinner. She had a bacon egg and cheese sandwich and grapes at 0830 this morning. She has not eaten since then.   Patient has additional questions regarding the timing of her delivery. She states she has not been scheduled for an induction and she is "ready to be done". [facility] could not schedule her induction as MAU CNM "would probably keep me and induce me".   Patient receives care with [facility]   OB History     Gravida  3   Para  1   Term  1   Preterm      AB  1   Living  1      SAB      IAB  0   Ectopic      Multiple      Live Births              Past Medical  History:  Diagnosis Date   Acid reflux    History of chlamydia 07/2014   History of trichomoniasis 07/2014   Vaginal Pap smear, abnormal     Past Surgical History:  Procedure Laterality Date   WISDOM TOOTH EXTRACTION      Family History  Problem Relation Age of Onset   Diabetes Paternal Grandmother    Hypertension Paternal Grandmother    Cancer Paternal Grandmother        Ovarian   Hypertension Other    Asthma Neg Hx    Heart disease Neg Hx    Stroke Neg Hx     Social History   Tobacco Use   Smoking status: Never   Smokeless tobacco: Never  Vaping Use   Vaping Use: Former   Substances: Nicotine, Flavoring  Substance Use Topics   Alcohol use: Not Currently   Drug use: Never    Allergies: No Known Allergies  Facility-Administered Medications Prior to Admission  Medication Dose Route Frequency Provider Last Rate Last Admin   clindamycin (CLEOCIN) 2 % vaginal cream 1 Applicatorful  1 Applicatorful Vaginal QHS AWoodroe Mode MD       Medications Prior to Admission  Medication Sig Dispense Refill Last Dose  Accu-Chek Softclix Lancets lancets 1 each by Other route 4 (four) times daily. Use as instructed 100 each 12 Past Week   famotidine (PEPCID) 20 MG tablet Take 1 tablet (20 mg total) by mouth 2 (two) times daily. 60 tablet 2 06/16/2022 at 2100   glucose blood (ACCU-CHEK GUIDE) test strip 1 each by Other route 4 (four) times daily. Use as instructed 100 each 12 06/17/2022   Prenatal MV & Min w/FA-DHA (PRENATAL ADULT GUMMY/DHA/FA) 0.4-25 MG CHEW Chew 1 Units by mouth daily. 90 tablet 2 Past Week   aspirin EC 81 MG tablet Take 1 tablet (81 mg total) by mouth daily. Take after 12 weeks for prevention of preeclampsia later in pregnancy (Patient not taking: Reported on 03/11/2022) 300 tablet 2    Blood Glucose Monitoring Suppl (ACCU-CHEK GUIDE) w/Device KIT 1 Device by Does not apply route 4 (four) times daily. 1 kit 0    cyclobenzaprine (FLEXERIL) 5 MG tablet Take 1-2 tablets  (5-10 mg total) by mouth 3 (three) times daily as needed for muscle spasms. (Patient not taking: Reported on 03/08/2022) 20 tablet 0    Doxylamine-Pyridoxine (DICLEGIS) 10-10 MG TBEC Take 2 tablets by mouth at bedtime. May add 1 tablet at breakfast and 1 tablet at lunch if needed (Patient not taking: Reported on 02/04/2022) 100 tablet 2     Review of Systems  Eyes:  Negative for visual disturbance.  Gastrointestinal:  Positive for abdominal pain.  Genitourinary:  Negative for vaginal bleeding.  Musculoskeletal:  Positive for joint swelling.  Neurological:  Negative for headaches.  All other systems reviewed and are negative.  Physical Exam   Blood pressure 125/87, pulse 75, resp. rate 17, weight 89.8 kg, last menstrual period 09/24/2021, SpO2 100 %.  Physical Exam Vitals and nursing note reviewed.  Constitutional:      General: She is not in acute distress.    Appearance: She is well-developed. She is not ill-appearing.  Cardiovascular:     Rate and Rhythm: Normal rate.  Pulmonary:     Effort: Pulmonary effort is normal.  Abdominal:     Palpations: Abdomen is soft.     Comments: Gravid  Musculoskeletal:     Right lower leg: 1+ Edema present.     Left lower leg: 1+ Edema present.  Skin:    Capillary Refill: Capillary refill takes less than 2 seconds.  Neurological:     Mental Status: She is alert and oriented to person, place, and time.  Psychiatric:        Mood and Affect: Mood normal.        Speech: Speech normal.        Behavior: Behavior normal.     MAU Course  Procedures  MDM  -Reactive tracing: baseline 135, mod var, 15 x 15 accels, no decels -Toco: irregular contractions -Patient declined cervical exam -Patient without documented Hypertension at any time in current pregnancy. Will cycle BPs for 1 hour, if elevated will order PEC labs -Headache and visual disturbances not present during MAU encounter  Orders Placed This Encounter  Procedures   Urinalysis,  Routine w reflex microscopic -Urine, Clean Catch   Glucose, capillary   Measure blood pressure   Discharge patient   Patient Vitals for the past 24 hrs:  BP Pulse Resp SpO2 Weight  06/17/22 1500 120/81 82 17 -- --  06/17/22 1445 121/81 92 -- 99 % --  06/17/22 1430 132/83 80 -- -- --  06/17/22 1429 130/81 78 -- 99 % --  06/17/22 1410 125/87 75 17 100 % 89.8 kg   Results for orders placed or performed during the hospital encounter of 06/17/22 (from the past 24 hour(s))  Urinalysis, Routine w reflex microscopic -Urine, Clean Catch     Status: Abnormal   Collection Time: 06/17/22  2:50 PM  Result Value Ref Range   Color, Urine YELLOW YELLOW   APPearance HAZY (A) CLEAR   Specific Gravity, Urine 1.014 1.005 - 1.030   pH 6.0 5.0 - 8.0   Glucose, UA NEGATIVE NEGATIVE mg/dL   Hgb urine dipstick NEGATIVE NEGATIVE   Bilirubin Urine NEGATIVE NEGATIVE   Ketones, ur NEGATIVE NEGATIVE mg/dL   Protein, ur 30 (A) NEGATIVE mg/dL   Nitrite NEGATIVE NEGATIVE   Leukocytes,Ua TRACE (A) NEGATIVE   RBC / HPF 0-5 0 - 5 RBC/hpf   WBC, UA 0-5 0 - 5 WBC/hpf   Bacteria, UA RARE (A) NONE SEEN   Squamous Epithelial / HPF 6-10 0 - 5 /HPF   Mucus PRESENT   Glucose, capillary     Status: Abnormal   Collection Time: 06/17/22  2:52 PM  Result Value Ref Range   Glucose-Capillary 68 (L) 70 - 99 mg/dL   Assessment and Plan  --32 y.o. G3P1011 at [redacted]w[redacted]d --Proteinuria without HTN --Headache not present during encounter --Cat I tracing --A1GDM, random CBG WNL --Cervical exam declined by patient --Discharge home in stable condition with return precautions  F/U: --[facility] --Per MFM guidance, IOL for well controlled A1 GDM should occur between 39 and 40 weeks.  --IOL scheduled for midnight 03/30 at 40+2 (first available)  --Patient can coordinate with [facility] to see if earlier opening becomes available  SDarlina Rumpf [place] MSN, CNM 06/17/2022, 4:55 PM

## 2022-06-17 NOTE — Discharge Instructions (Signed)
Congratulations, you're on your way to having your baby!!!   You now have an induction scheduled.   If your induction is scheduled for midnight, you will arrive at 11:45pm on the date provided on the form form the clinical staff today. [facility] (Entrance C) and tell them you are there for your induction. The hospital will only call you to not come if they have NO beds available and need to postpone your admission time.    You will also get a call from the pre-admission nurse to go over pre-admission screen about 2 days prior to your induction date.      

## 2022-06-17 NOTE — Progress Notes (Signed)
TC to after hours nurse line over lunch from pt reporting swelling in hands, feet, legs. RTC. Pt reports HA and seeing spots as well. Pt is currently sitting in MAU. Reassured pt that MAU providers work in concert with [facility] and would provide needed assessments and develop an appropriate POC. Pt concerned with IOL date. Advised delivery timing would likely be contingent on today's assessment, labs, etc. Pt verbalized understanding and reassurance.

## 2022-06-17 NOTE — MAU Note (Signed)
Sara Higgins is a 32 y.o. at 53w0dhere in MAU reporting: headache since 0900 today, did not take any medications to alleviate. Reports seeing spots intermittently throughout the day. None currently. Mild intermittent cramping.  No LOF or bleeding. DFM  Pain score: 7/10 Vitals:   06/17/22 1410  BP: 125/87  Pulse: 75  Resp: 17  SpO2: 100%     FHT: 140 bpm Lab orders placed from triage: U/A

## 2022-06-23 ENCOUNTER — Inpatient Hospital Stay (HOSPITAL_COMMUNITY): Payer: Medicaid Other | Admitting: Anesthesiology

## 2022-06-23 ENCOUNTER — Inpatient Hospital Stay (HOSPITAL_COMMUNITY)
Admission: AD | Admit: 2022-06-23 | Discharge: 2022-06-27 | DRG: 786 | Disposition: A | Discharge status: Home / Self Care | Payer: Medicaid Other | Attending: Family Medicine | Admitting: Family Medicine

## 2022-06-23 ENCOUNTER — Ambulatory Visit (INDEPENDENT_AMBULATORY_CARE_PROVIDER_SITE_OTHER): Payer: Medicaid Other | Admitting: Obstetrics and Gynecology

## 2022-06-23 ENCOUNTER — Encounter (HOSPITAL_COMMUNITY): Payer: Self-pay | Admitting: Obstetrics and Gynecology

## 2022-06-23 ENCOUNTER — Other Ambulatory Visit: Payer: Self-pay

## 2022-06-23 VITALS — BP 126/85 | HR 96 | Wt 200.1 lb

## 2022-06-23 DIAGNOSIS — O24420 Gestational diabetes mellitus in childbirth, diet controlled: Principal | ICD-10-CM | POA: Diagnosis present

## 2022-06-23 DIAGNOSIS — O26833 Pregnancy related renal disease, third trimester: Secondary | ICD-10-CM | POA: Diagnosis not present

## 2022-06-23 DIAGNOSIS — Z3480 Encounter for supervision of other normal pregnancy, unspecified trimester: Secondary | ICD-10-CM

## 2022-06-23 DIAGNOSIS — O24410 Gestational diabetes mellitus in pregnancy, diet controlled: Secondary | ICD-10-CM

## 2022-06-23 DIAGNOSIS — O9902 Anemia complicating childbirth: Secondary | ICD-10-CM | POA: Diagnosis present

## 2022-06-23 DIAGNOSIS — O368130 Decreased fetal movements, third trimester, not applicable or unspecified: Secondary | ICD-10-CM | POA: Diagnosis present

## 2022-06-23 DIAGNOSIS — Z3A39 39 weeks gestation of pregnancy: Secondary | ICD-10-CM | POA: Diagnosis not present

## 2022-06-23 DIAGNOSIS — O411230 Chorioamnionitis, third trimester, not applicable or unspecified: Secondary | ICD-10-CM | POA: Diagnosis present

## 2022-06-23 DIAGNOSIS — O24429 Gestational diabetes mellitus in childbirth, unspecified control: Secondary | ICD-10-CM | POA: Diagnosis not present

## 2022-06-23 DIAGNOSIS — N179 Acute kidney failure, unspecified: Secondary | ICD-10-CM | POA: Diagnosis not present

## 2022-06-23 DIAGNOSIS — Z3490 Encounter for supervision of normal pregnancy, unspecified, unspecified trimester: Secondary | ICD-10-CM | POA: Diagnosis present

## 2022-06-23 DIAGNOSIS — O99214 Obesity complicating childbirth: Secondary | ICD-10-CM | POA: Diagnosis present

## 2022-06-23 DIAGNOSIS — Z3A38 38 weeks gestation of pregnancy: Secondary | ICD-10-CM | POA: Diagnosis not present

## 2022-06-23 DIAGNOSIS — R7989 Other specified abnormal findings of blood chemistry: Secondary | ICD-10-CM

## 2022-06-23 DIAGNOSIS — O368190 Decreased fetal movements, unspecified trimester, not applicable or unspecified: Secondary | ICD-10-CM | POA: Diagnosis present

## 2022-06-23 DIAGNOSIS — O411290 Chorioamnionitis, unspecified trimester, not applicable or unspecified: Secondary | ICD-10-CM | POA: Insufficient documentation

## 2022-06-23 DIAGNOSIS — R030 Elevated blood-pressure reading, without diagnosis of hypertension: Principal | ICD-10-CM

## 2022-06-23 DIAGNOSIS — O99210 Obesity complicating pregnancy, unspecified trimester: Secondary | ICD-10-CM | POA: Diagnosis present

## 2022-06-23 DIAGNOSIS — O24419 Gestational diabetes mellitus in pregnancy, unspecified control: Principal | ICD-10-CM | POA: Diagnosis present

## 2022-06-23 LAB — CBC
HCT: 40.3 % (ref 36.0–46.0)
Hemoglobin: 12.8 g/dL (ref 12.0–15.0)
MCH: 24.9 pg — ABNORMAL LOW (ref 26.0–34.0)
MCHC: 31.8 g/dL (ref 30.0–36.0)
MCV: 78.3 fL — ABNORMAL LOW (ref 80.0–100.0)
Platelets: 186 10*3/uL (ref 150–400)
RBC: 5.15 MIL/uL — ABNORMAL HIGH (ref 3.87–5.11)
RDW: 14.5 % (ref 11.5–15.5)
WBC: 9 10*3/uL (ref 4.0–10.5)
nRBC: 0 % (ref 0.0–0.2)

## 2022-06-23 LAB — GLUCOSE, CAPILLARY
Glucose-Capillary: 78 mg/dL (ref 70–99)
Glucose-Capillary: 81 mg/dL (ref 70–99)
Glucose-Capillary: 178 mg/dL — ABNORMAL HIGH (ref 70–99)

## 2022-06-23 LAB — TYPE AND SCREEN
ABO/RH(D): O POS
Antibody Screen: NEGATIVE

## 2022-06-23 MED ORDER — ONDANSETRON HCL 4 MG/2ML IJ SOLN: 4.0000 mg | Freq: Four times a day (QID) | INTRAMUSCULAR | Status: DC | PRN

## 2022-06-23 MED ORDER — LIDOCAINE-EPINEPHRINE (PF) 2 %-1:200000 IJ SOLN
INTRAMUSCULAR | Status: DC | PRN
  Administered 2022-06-23: 5 mL via EPIDURAL

## 2022-06-23 MED ORDER — OXYTOCIN-SODIUM CHLORIDE 30-0.9 UT/500ML-% IV SOLN: 2.5000 [IU]/h | INTRAVENOUS | Status: DC

## 2022-06-23 MED ORDER — ACETAMINOPHEN 325 MG PO TABS
650.0000 mg | ORAL_TABLET | ORAL | Status: DC | PRN
  Administered 2022-06-24 (×2): 650 mg via ORAL
  Filled 2022-06-23 (×2): qty 2

## 2022-06-23 MED ORDER — OXYCODONE-ACETAMINOPHEN 5-325 MG PO TABS: 1.0000 | ORAL_TABLET | ORAL | Status: DC | PRN

## 2022-06-23 MED ORDER — FENTANYL-BUPIVACAINE-NACL 0.5-0.125-0.9 MG/250ML-% EP SOLN
EPIDURAL | Status: AC
  Filled 2022-06-23: qty 250

## 2022-06-23 MED ORDER — DIPHENHYDRAMINE HCL 50 MG/ML IJ SOLN: 12.5000 mg | INTRAMUSCULAR | Status: DC | PRN

## 2022-06-23 MED ORDER — PHENYLEPHRINE 80 MCG/ML (10ML) SYRINGE FOR IV PUSH (FOR BLOOD PRESSURE SUPPORT): 80.0000 ug | PREFILLED_SYRINGE | INTRAVENOUS | Status: DC | PRN

## 2022-06-23 MED ORDER — OXYCODONE-ACETAMINOPHEN 5-325 MG PO TABS: 2.0000 | ORAL_TABLET | ORAL | Status: DC | PRN

## 2022-06-23 MED ORDER — LACTATED RINGERS IV SOLN
500.0000 mL | Freq: Once | INTRAVENOUS | Status: AC
  Administered 2022-06-24: 500 mL via INTRAVENOUS

## 2022-06-23 MED ORDER — EPHEDRINE 5 MG/ML INJ: 10.0000 mg | INTRAVENOUS | Status: DC | PRN

## 2022-06-23 MED ORDER — OXYTOCIN BOLUS FROM INFUSION: 333.0000 mL | Freq: Once | INTRAVENOUS | Status: DC

## 2022-06-23 MED ORDER — TERBUTALINE SULFATE 1 MG/ML IJ SOLN: 0.2500 mg | Freq: Once | INTRAMUSCULAR | Status: DC | PRN

## 2022-06-23 MED ORDER — LIDOCAINE HCL (PF) 1 % IJ SOLN: 30.0000 mL | INTRAMUSCULAR | Status: DC | PRN

## 2022-06-23 MED ORDER — MISOPROSTOL 25 MCG QUARTER TABLET
25.0000 ug | ORAL_TABLET | ORAL | Status: DC | PRN
  Administered 2022-06-23: 25 ug via VAGINAL
  Filled 2022-06-23: qty 1

## 2022-06-23 MED ORDER — FENTANYL-BUPIVACAINE-NACL 0.5-0.125-0.9 MG/250ML-% EP SOLN
12.0000 mL/h | EPIDURAL | Status: DC | PRN
  Administered 2022-06-23 – 2022-06-24 (×3): 12 mL/h via EPIDURAL
  Filled 2022-06-23 (×2): qty 250

## 2022-06-23 MED ORDER — MISOPROSTOL 25 MCG QUARTER TABLET
25.0000 ug | ORAL_TABLET | Freq: Once | ORAL | Status: AC
  Administered 2022-06-23: 25 ug via VAGINAL
  Filled 2022-06-23: qty 1

## 2022-06-23 MED ORDER — SOD CITRATE-CITRIC ACID 500-334 MG/5ML PO SOLN
30.0000 mL | ORAL | Status: DC | PRN
  Administered 2022-06-23 – 2022-06-24 (×2): 30 mL via ORAL
  Filled 2022-06-23 (×2): qty 30

## 2022-06-23 MED ORDER — LACTATED RINGERS IV SOLN: 500.0000 mL | INTRAVENOUS | Status: DC | PRN

## 2022-06-23 MED ORDER — MISOPROSTOL 50MCG HALF TABLET
50.0000 ug | ORAL_TABLET | Freq: Once | ORAL | Status: AC
  Administered 2022-06-23: 50 ug via ORAL
  Filled 2022-06-23: qty 1

## 2022-06-23 MED ORDER — LACTATED RINGERS IV SOLN: INTRAVENOUS | Status: DC

## 2022-06-23 NOTE — Anesthesia Preprocedure Evaluation (Signed)
Anesthesia Evaluation  Patient identified by MRN, date of birth, ID band Patient awake    Reviewed: Allergy & Precautions, NPO status , Patient's Chart, lab work & pertinent test results  Airway Mallampati: III  TM Distance: >3 FB Neck ROM: Full    Dental no notable dental hx.    Pulmonary neg pulmonary ROS   Pulmonary exam normal breath sounds clear to auscultation       Cardiovascular negative cardio ROS Normal cardiovascular exam Rhythm:Regular Rate:Normal     Neuro/Psych negative neurological ROS  negative psych ROS   GI/Hepatic Neg liver ROS,GERD  ,,  Endo/Other  diabetes, Gestational    Renal/GU negative Renal ROS  negative genitourinary   Musculoskeletal negative musculoskeletal ROS (+)    Abdominal   Peds  Hematology negative hematology ROS (+)   Anesthesia Other Findings IOL for gDM  Reproductive/Obstetrics (+) Pregnancy                             Anesthesia Physical Anesthesia Plan  ASA: 3  Anesthesia Plan: Epidural   Post-op Pain Management:    Induction:   PONV Risk Score and Plan: Treatment may vary due to age or medical condition  Airway Management Planned: Natural Airway  Additional Equipment:   Intra-op Plan:   Post-operative Plan:   Informed Consent: I have reviewed the patients History and Physical, chart, labs and discussed the procedure including the risks, benefits and alternatives for the proposed anesthesia with the patient or authorized representative who has indicated his/her understanding and acceptance.       Plan Discussed with: Anesthesiologist  Anesthesia Plan Comments: (Patient identified. Risks, benefits, options discussed with patient including but not limited to bleeding, infection, nerve damage, paralysis, failed block, incomplete pain control, headache, blood pressure changes, nausea, vomiting, reactions to medication, itching, and  post partum back pain. Confirmed with bedside nurse the patient's most recent platelet count. Confirmed with the patient that they are not taking any anticoagulation, have any bleeding history or any family history of bleeding disorders. Patient expressed understanding and wishes to proceed. All questions were answered. )       Anesthesia Quick Evaluation

## 2022-06-23 NOTE — H&P (Addendum)
OBSTETRIC ADMISSION HISTORY AND PHYSICAL  Sara GLADIEUX is a 32 y.o. female G3P1011 with IUP at [redacted]w[redacted]d by LMP c/w [redacted]w[redacted]d [place] presenting for IOL for poorly controlled A1GDM. She was sent from prenatal visit with decreased fetal movement and was found to have non-reactive NST. She reports +FMs, No LOF, no VB, no blurry vision, headaches or peripheral edema, and RUQ pain.  She plans on breast feeding. She request pills for birth control. She received her prenatal care at [facility]: By LMP --->  Estimated Date of Delivery: 07/01/22  Sono:    @[redacted]w[redacted]d , normal anatomy, breech presentation, posterior placenta @[redacted]w[redacted]d , cephalic, posterior placenta, EFW 65%, AC 94%   Prenatal History/Complications:  - [id number]    Past Medical History: Past Medical History:  Diagnosis Date   Acid reflux    History of chlamydia 07/2014   History of trichomoniasis 07/2014   Vaginal Pap smear, abnormal     Past Surgical History: Past Surgical History:  Procedure Laterality Date   WISDOM TOOTH EXTRACTION      Obstetrical History: OB History  Gravida Para Term Preterm AB Living  3 1 1   1 1   SAB IAB Ectopic Multiple Live Births    0          # Outcome Date GA Lbr Len/2nd Weight Sex Delivery Anes PTL Lv  3 Current           2 Term 11/01/09     Vag-Spont     1 AB             Social History Social History   Socioeconomic History   Marital status: Single    Spouse name: Not on file   Number of children: Not on file   Years of education: Not on file   Highest education level: Not on file  Occupational History   Not on file  Tobacco Use   Smoking status: Never   Smokeless tobacco: Never  Vaping Use   Vaping Use: Former   Substances: Nicotine, Flavoring  Substance and Sexual Activity   Alcohol use: Not Currently   Drug use: Never   Sexual activity: Not Currently    Birth control/protection: None  Other Topics Concern   Not on file  Social History Narrative   Not on file   Social  Determinants of Health   Financial Resource Strain: Not on file  Food Insecurity: No Food Insecurity (06/23/2022)   Hunger Vital Sign    Worried About Running Out of Food in the Last Year: Never true    Ran Out of Food in the Last Year: Never true  Transportation Needs: No Transportation Needs (06/23/2022)   PRAPARE - Hydrologist (Medical): No    Lack of Transportation (Non-Medical): No  Physical Activity: Sufficiently Active (02/15/2017)   Exercise Vital Sign    Days of Exercise per Week: 5 days    Minutes of Exercise per Session: 30 min  Stress: Stress Concern Present (02/15/2017)   [place]    Feeling of Stress : To some extent  Social Connections: Somewhat Isolated (02/15/2017)   Social Connection and Isolation Panel [NHANES]    Frequency of Communication with Friends and Family: More than three times a week    Frequency of Social Gatherings with Friends and Family: More than three times a week    Attends Religious Services: More than 4 times per year    Active  Member of Clubs or Organizations: No    Attends Music therapist: Never    Marital Status: Never married    Family History: Family History  Problem Relation Age of Onset   Diabetes Paternal Grandmother    Hypertension Paternal Grandmother    Cancer Paternal Grandmother        Ovarian   Hypertension Other    Asthma Neg Hx    Heart disease Neg Hx    Stroke Neg Hx     Allergies: No Known Allergies  Medications Prior to Admission  Medication Sig Dispense Refill Last Dose   Accu-Chek Softclix Lancets lancets 1 each by Other route 4 (four) times daily. Use as instructed 100 each 12    aspirin EC 81 MG tablet Take 1 tablet (81 mg total) by mouth daily. Take after 12 weeks for prevention of preeclampsia later in pregnancy (Patient not taking: Reported on 03/11/2022) 300 tablet 2    Blood Glucose Monitoring  Suppl (ACCU-CHEK GUIDE) w/Device KIT 1 Device by Does not apply route 4 (four) times daily. 1 kit 0    cyclobenzaprine (FLEXERIL) 5 MG tablet Take 1-2 tablets (5-10 mg total) by mouth 3 (three) times daily as needed for muscle spasms. (Patient not taking: Reported on 03/08/2022) 20 tablet 0    Doxylamine-Pyridoxine (DICLEGIS) 10-10 MG TBEC Take 2 tablets by mouth at bedtime. May add 1 tablet at breakfast and 1 tablet at lunch if needed (Patient not taking: Reported on 02/04/2022) 100 tablet 2    famotidine (PEPCID) 20 MG tablet Take 1 tablet (20 mg total) by mouth 2 (two) times daily. 60 tablet 2    glucose blood (ACCU-CHEK GUIDE) test strip 1 each by Other route 4 (four) times daily. Use as instructed 100 each 12    Prenatal MV & Min w/FA-DHA (PRENATAL ADULT GUMMY/DHA/FA) 0.4-25 MG CHEW Chew 1 Units by mouth daily. 90 tablet 2      Review of Systems   All systems reviewed and negative except as stated in HPI  Height 5\' 1"  (1.549 m), weight 89.4 kg, last menstrual period 09/24/2021. General appearance: alert, in no acute distress Lungs: normal work of breathing Heart: normal rate Presentation: cephalic on SVE Fetal monitoring: baseline 140, mod variability, - accels, - decels Uterine activity: difficult to trace  Dilation: 2 Effacement (%): 50 Station: -3 Exam by:: Deeann Cree   Prenatal labs: ABO, Rh: --/--/PENDING (03/20 1337) Antibody: PENDING (03/20 1337) Rubella: 13.90 (09/11 1537) RPR: Non Reactive (01/02 0825)  HBsAg: Negative (09/11 1537)  HIV: Non Reactive (01/02 0825)  GBS: Negative/-- (03/05 1125)  2 hr GTT 79/191/150 Genetic screening  NIPS:   Low risk Female; AFP:  neg; 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 Anatomy US normal  Prenatal Transfer Tool  Maternal Diabetes: Yes:  Diabetes Type:  Diet controlled Genetic Screening: Normal Maternal Ultrasounds/Referrals: Normal Fetal Ultrasounds or other Referrals:  None Maternal Substance Abuse:  No Significant Maternal  Medications:  None Significant Maternal Lab Results:  Group B Strep negative Number of Prenatal Visits:greater than 3 verified prenatal visits Other Comments:  None  Results for orders placed or performed during the hospital encounter of 06/23/22 (from the past 24 hour(s))  CBC   Collection Time: 06/23/22  1:30 PM  Result Value Ref Range   WBC 9.0 4.0 - 10.5 K/uL   RBC 5.15 (H) 3.87 - 5.11 MIL/uL   Hemoglobin 12.8 12.0 - 15.0 g/dL   HCT 40.3 36.0 - 46.0 %   MCV 78.3 (  L) 80.0 - 100.0 fL   MCH 24.9 (L) 26.0 - 34.0 pg   MCHC 31.8 30.0 - 36.0 g/dL   RDW 14.5 11.5 - 15.5 %   Platelets 186 150 - 400 K/uL   nRBC 0.0 0.0 - 0.2 %  Type and screen [place]   Collection Time: 06/23/22  1:37 PM  Result Value Ref Range   ABO/RH(D) PENDING    Antibody Screen PENDING    Sample Expiration      06/26/2022,2359 Performed at [facility], [place] [address] [place], [place] [address]   Glucose, capillary   Collection Time: 06/23/22  2:00 PM  Result Value Ref Range   Glucose-Capillary 78 70 - 99 mg/dL    Patient Active Problem List   Diagnosis Date Noted   Gestational diabetes mellitus (GDM) 06/23/2022   Decreased fetal movement affecting management of mother, antepartum 06/23/2022   Gestational diabetes mellitus (GDM), antepartum 04/14/2022   Obesity affecting pregnancy, antepartum 03/08/2022   Supervision of other normal pregnancy, antepartum 12/14/2021   Heartburn during pregnancy in first trimester 12/14/2021   Encounter for induction of labor 10/21/2020   Pap smear with atypical squamous cells, cannot exclude high grade squamous intraepithelial lesion (ASC-H) 08/26/2015    Assessment/Plan:  Sara Higgins is a 32 y.o. G3P1011 at [redacted]w[redacted]d here for IOL for A1GDM and DFM with nonreactive NST at clinic.   #A1GDM - ? Poorly controlled, glucose log were not able to be evaluated in clinic - on arrival CBG was 78 - q4hr latent phase, q2hr active phase, hourly  second stage  #Labor:s/p cytotec vag 46mcg #Pain: Planning for epidural #FWB: Cat 1 tracing now. Continue to monitor closely #ID:  GBS neg #MOF: breast #MOC: pills  Seen and discussed with Dr. Simmie Davies. Jiayu "Darlyne Russian, M.D. PGY-2 Family Medicine Visiting Resident Faculty Practice 06/23/2022 2:21 PM   ___ GME ATTESTATION:  Evaluation and management procedures were performed by the [facility] Medicine Resident under my supervision. I was immediately available for direct supervision, assistance and direction throughout this encounter.  I also confirm that I have verified the information documented in the resident's note, and that I have also personally reperformed the pertinent components of the physical exam and all of the medical decision making activities.  I have also made any necessary editorial changes.  Shelda Pal, DO OB Fellow, [place] for [place] 06/23/2022 2:34 PM

## 2022-06-23 NOTE — Progress Notes (Addendum)
Labor Progress Note  Sara Higgins is a 32 y.o. G3P1011 at [redacted]w[redacted]d presented for IOL for A1GDM and NRFHT.   S:  contractions are getting stronger, though not frequent  O:  BP 125/84   Pulse 94   Temp 98.1 F (36.7 C) (Oral)   Resp 18   Ht 5\' 1"  (1.549 m)   Wt 89.4 kg   LMP 09/24/2021   BMI 37.22 kg/m  EFM: 130bpm/ min variability/  +ve accels/  no decels (Mostly in moderate variability in the last few hours, cat 1)  CVE: Dilation: 2 Effacement (%): 50 Station: -3 Presentation: Vertex Exam by:: Dr. Darlyne Russian   A&P: 32 y.o. [id number] [redacted]w[redacted]d  here for IOL as above #A1GDM - ? Poorly controlled, glucose log were not able to be evaluated in clinic - on arrival CBG was 78 - q4hr latent phase, q2hr active phase, hourly second stage #Labor: s/p cyto vag 31mcg. Made no cervical change in the last four hours. Redosed cytotec 75mcg. Disucssed foley ballon in 4 hours if no cervical change potentially with fentanyl.  #Pain: planning Epidural. Very scared of pain. It is important for her that she gets good pain relief with epidural  #FWB: CAT 2 (though mostly cat 1 in the last four hours) #GBS negative  Discussed with Dr. Simmie Davies. [name] "Darlyne Russian, M.D. PGY-2 Family Medicine Visiting Resident Faculty Practice 06/23/2022 6:05 PM

## 2022-06-23 NOTE — Progress Notes (Addendum)
ROB 38.[redacted] wks GA Reports decreased FM, irreg UC, vaginal pain and pressure GDM: Reports CBGs have been "high" but does not have log  1125 EFM placed, FHR 135

## 2022-06-23 NOTE — Progress Notes (Signed)
   PRENATAL VISIT NOTE  Subjective:  Sara Higgins is a 32 y.o. G3P1011 at [redacted]w[redacted]d being seen today for ongoing prenatal care.  She is currently monitored for the following issues for this high-risk pregnancy and has Pap smear with atypical squamous cells, cannot exclude high grade squamous intraepithelial lesion (ASC-H); Screening examination for STD (sexually transmitted disease); Supervision of other normal pregnancy, antepartum; Heartburn during pregnancy in first trimester; Obesity affecting pregnancy, antepartum; and Gestational diabetes mellitus (GDM), antepartum on their problem list.  Patient doing well with no acute concerns today. She reports no complaints.  Contractions: Irregular. Vag. Bleeding: None.  Movement: (!) Decreased. Denies leaking of fluid.   The following portions of the patient's history were reviewed and updated as appropriate: allergies, current medications, past family history, past medical history, past social history, past surgical history and problem list. Problem list updated.  Objective:   Vitals:   06/23/22 1107  BP: 126/85  Pulse: 96  Weight: 200 lb 1.6 oz (90.8 kg)    Fetal Status: Fetal Heart Rate (bpm): 135   Movement: (!) Decreased     General:  Alert, oriented and cooperative. Patient is in no acute distress.  Skin: Skin is warm and dry. No rash noted.   Cardiovascular: Normal heart rate noted  Respiratory: Normal respiratory effort, no problems with respiration noted  Abdomen: Soft, gravid, appropriate for gestational age.  Pain/Pressure: Present     Pelvic: Cervical exam deferred        Extremities: Normal range of motion.  Edema: Trace  Mental Status:  Normal mood and affect. Normal behavior. Normal judgment and thought content.  NST: baseline 130s, minimal variability, no decels, no significant accels Assessment and Plan:  Pregnancy: G3P1011 at [redacted]w[redacted]d  1. [redacted] weeks gestation of pregnancy   2. Diet controlled gestational diabetes  mellitus (GDM), antepartum Pt did not bring in blood sugars, but notes FBS is well above 100, possibly 170s.  3. Supervision of other normal pregnancy, antepartum Due to nonreactive NST and poor blood sugar control, Dr. Si Raider has been contacted and agrees with direct admit for decreased fetal movement at term.  Pt will go directly to L and D  Term labor symptoms and general obstetric precautions including but not limited to vaginal bleeding, contractions, leaking of fluid and fetal movement were reviewed in detail with the patient.  Please refer to After Visit Summary for other counseling recommendations.   No follow-ups on file.   Lynnda Shields, MD Faculty Attending Center for [facility]

## 2022-06-23 NOTE — Anesthesia Procedure Notes (Signed)
Epidural Patient location during procedure: OB Start time: 06/23/2022 10:55 PM End time: 06/23/2022 11:05 PM  Staffing Anesthesiologist: Freddrick March, MD Performed: anesthesiologist   Preanesthetic Checklist Completed: patient identified, IV checked, risks and benefits discussed, monitors and equipment checked, pre-op evaluation and timeout performed  Epidural Patient position: sitting Prep: DuraPrep and site prepped and draped Patient monitoring: continuous pulse ox, blood pressure, heart rate and cardiac monitor Approach: midline Location: L3-L4 Injection technique: LOR air  Needle:  Needle type: Tuohy  Needle gauge: 17 G Needle length: 9 cm Needle insertion depth: 6 cm Catheter type: closed end flexible Catheter size: 19 Gauge Catheter at skin depth: 11 cm Test dose: negative  Assessment Sensory level: T8 Events: blood not aspirated, no cerebrospinal fluid, injection not painful, no injection resistance, no paresthesia and negative IV test  Additional Notes Patient identified. Risks/Benefits/Options discussed with patient including but not limited to bleeding, infection, nerve damage, paralysis, failed block, incomplete pain control, headache, blood pressure changes, nausea, vomiting, reactions to medication both or allergic, itching and postpartum back pain. Confirmed with bedside nurse the patient's most recent platelet count. Confirmed with patient that they are not currently taking any anticoagulation, have any bleeding history or any family history of bleeding disorders. Patient expressed understanding and wished to proceed. All questions were answered. Sterile technique was used throughout the entire procedure. Please see nursing notes for vital signs. Test dose was given through epidural catheter and negative prior to continuing to dose epidural or start infusion. Warning signs of high block given to the patient including shortness of breath, tingling/numbness in  hands, complete motor block, or any concerning symptoms with instructions to call for help. Patient was given instructions on fall risk and not to get out of bed. All questions and concerns addressed with instructions to call with any issues or inadequate analgesia.  Reason for block:procedure for pain

## 2022-06-24 ENCOUNTER — Encounter (HOSPITAL_COMMUNITY)
Admission: AD | Disposition: A | Discharge status: Home / Self Care | Payer: Self-pay | Source: Home / Self Care | Attending: Family Medicine

## 2022-06-24 ENCOUNTER — Encounter (HOSPITAL_COMMUNITY): Payer: Self-pay | Admitting: Obstetrics and Gynecology

## 2022-06-24 DIAGNOSIS — Z3A38 38 weeks gestation of pregnancy: Secondary | ICD-10-CM

## 2022-06-24 DIAGNOSIS — Z3A39 39 weeks gestation of pregnancy: Secondary | ICD-10-CM

## 2022-06-24 DIAGNOSIS — O411230 Chorioamnionitis, third trimester, not applicable or unspecified: Secondary | ICD-10-CM

## 2022-06-24 DIAGNOSIS — O24429 Gestational diabetes mellitus in childbirth, unspecified control: Secondary | ICD-10-CM

## 2022-06-24 DIAGNOSIS — O368130 Decreased fetal movements, third trimester, not applicable or unspecified: Secondary | ICD-10-CM

## 2022-06-24 DIAGNOSIS — O24420 Gestational diabetes mellitus in childbirth, diet controlled: Secondary | ICD-10-CM | POA: Diagnosis not present

## 2022-06-24 LAB — GLUCOSE, CAPILLARY
Glucose-Capillary: 117 mg/dL — ABNORMAL HIGH (ref 70–99)
Glucose-Capillary: 70 mg/dL (ref 70–99)
Glucose-Capillary: 76 mg/dL (ref 70–99)
Glucose-Capillary: 78 mg/dL (ref 70–99)
Glucose-Capillary: 81 mg/dL (ref 70–99)
Glucose-Capillary: 82 mg/dL (ref 70–99)
Glucose-Capillary: 83 mg/dL (ref 70–99)
Glucose-Capillary: 87 mg/dL (ref 70–99)
Glucose-Capillary: 92 mg/dL (ref 70–99)
Glucose-Capillary: 94 mg/dL (ref 70–99)

## 2022-06-24 LAB — COMPREHENSIVE METABOLIC PANEL
ALT: 15 U/L (ref 0–44)
AST: 26 U/L (ref 15–41)
Albumin: 2.3 g/dL — ABNORMAL LOW (ref 3.5–5.0)
Alkaline Phosphatase: 157 U/L — ABNORMAL HIGH (ref 38–126)
Anion gap: 9 (ref 5–15)
BUN: 8 mg/dL (ref 6–20)
CO2: 17 mmol/L — ABNORMAL LOW (ref 22–32)
Calcium: 7.8 mg/dL — ABNORMAL LOW (ref 8.9–10.3)
Chloride: 101 mmol/L (ref 98–111)
Creatinine, Ser: 1.74 mg/dL — ABNORMAL HIGH (ref 0.44–1.00)
GFR, Estimated: 39 mL/min — ABNORMAL LOW (ref >60)
Glucose, Bld: 82 mg/dL (ref 70–99)
Potassium: 4.5 mmol/L (ref 3.5–5.1)
Sodium: 127 mmol/L — ABNORMAL LOW (ref 135–145)
Total Bilirubin: 0.6 mg/dL (ref 0.3–1.2)
Total Protein: 5.7 g/dL — ABNORMAL LOW (ref 6.5–8.1)

## 2022-06-24 LAB — RPR: RPR Ser Ql: NONREACTIVE

## 2022-06-24 SURGERY — Surgical Case
Anesthesia: Epidural | Site: Abdomen

## 2022-06-24 MED ORDER — KETOROLAC TROMETHAMINE 30 MG/ML IJ SOLN: 30.0000 mg | Freq: Once | INTRAMUSCULAR | Status: AC | PRN

## 2022-06-24 MED ORDER — DEXAMETHASONE SODIUM PHOSPHATE 4 MG/ML IJ SOLN
INTRAMUSCULAR | Status: DC | PRN
  Administered 2022-06-24: 4 mg via INTRAVENOUS

## 2022-06-24 MED ORDER — STERILE WATER FOR IRRIGATION IR SOLN
Status: DC | PRN
  Administered 2022-06-24: 1000 mL

## 2022-06-24 MED ORDER — HYDROMORPHONE HCL 1 MG/ML IJ SOLN
0.2500 mg | INTRAMUSCULAR | Status: DC | PRN
  Administered 2022-06-25: 0.5 mg via INTRAVENOUS

## 2022-06-24 MED ORDER — NALOXONE HCL 0.4 MG/ML IJ SOLN: 0.4000 mg | INTRAMUSCULAR | Status: DC | PRN

## 2022-06-24 MED ORDER — TRANEXAMIC ACID-NACL 1000-0.7 MG/100ML-% IV SOLN
1000.0000 mg | Freq: Once | INTRAVENOUS | Status: AC
  Administered 2022-06-24: 1000 mg via INTRAVENOUS

## 2022-06-24 MED ORDER — DIPHENHYDRAMINE HCL 50 MG/ML IJ SOLN: 12.5000 mg | INTRAMUSCULAR | Status: DC | PRN

## 2022-06-24 MED ORDER — TERBUTALINE SULFATE 1 MG/ML IJ SOLN: 0.2500 mg | Freq: Once | INTRAMUSCULAR | Status: DC | PRN

## 2022-06-24 MED ORDER — DIPHENHYDRAMINE HCL 25 MG PO CAPS: 25.0000 mg | ORAL_CAPSULE | ORAL | Status: DC | PRN

## 2022-06-24 MED ORDER — LIDOCAINE-EPINEPHRINE (PF) 2 %-1:200000 IJ SOLN
INTRAMUSCULAR | Status: DC | PRN
  Administered 2022-06-24 (×2): 5 mL via INTRADERMAL

## 2022-06-24 MED ORDER — PHENYLEPHRINE 80 MCG/ML (10ML) SYRINGE FOR IV PUSH (FOR BLOOD PRESSURE SUPPORT)
PREFILLED_SYRINGE | INTRAVENOUS | Status: AC
  Filled 2022-06-24: qty 10

## 2022-06-24 MED ORDER — SOD CITRATE-CITRIC ACID 500-334 MG/5ML PO SOLN: 30.0000 mL | ORAL | Status: DC

## 2022-06-24 MED ORDER — DEXAMETHASONE SODIUM PHOSPHATE 4 MG/ML IJ SOLN
INTRAMUSCULAR | Status: AC
  Filled 2022-06-24: qty 1

## 2022-06-24 MED ORDER — MORPHINE SULFATE (PF) 0.5 MG/ML IJ SOLN
INTRAMUSCULAR | Status: DC | PRN
  Administered 2022-06-24: 3 mg via EPIDURAL

## 2022-06-24 MED ORDER — ONDANSETRON HCL 4 MG/2ML IJ SOLN
INTRAMUSCULAR | Status: DC | PRN
  Administered 2022-06-24: 4 mg via INTRAVENOUS

## 2022-06-24 MED ORDER — ACETAMINOPHEN 10 MG/ML IV SOLN
INTRAVENOUS | Status: DC | PRN
  Administered 2022-06-24: 1000 mg via INTRAVENOUS

## 2022-06-24 MED ORDER — TRANEXAMIC ACID-NACL 1000-0.7 MG/100ML-% IV SOLN
INTRAVENOUS | Status: AC
  Filled 2022-06-24: qty 100

## 2022-06-24 MED ORDER — ACETAMINOPHEN 500 MG PO TABS
ORAL_TABLET | ORAL | Status: AC
  Filled 2022-06-24: qty 2

## 2022-06-24 MED ORDER — ACETAMINOPHEN 500 MG PO TABS
1000.0000 mg | ORAL_TABLET | Freq: Four times a day (QID) | ORAL | Status: DC | PRN
  Administered 2022-06-24 – 2022-06-27 (×3): 1000 mg via ORAL

## 2022-06-24 MED ORDER — ONDANSETRON HCL 4 MG/2ML IJ SOLN
INTRAMUSCULAR | Status: AC
  Filled 2022-06-24: qty 2

## 2022-06-24 MED ORDER — NALOXONE HCL 4 MG/10ML IJ SOLN: 1.0000 ug/kg/h | INTRAVENOUS | Status: DC | PRN

## 2022-06-24 MED ORDER — SODIUM CHLORIDE 0.9 % IV SOLN
500.0000 mg | INTRAVENOUS | Status: AC
  Administered 2022-06-24: 500 mg via INTRAVENOUS

## 2022-06-24 MED ORDER — FENTANYL CITRATE (PF) 100 MCG/2ML IJ SOLN
INTRAMUSCULAR | Status: DC | PRN
  Administered 2022-06-24: 100 ug via EPIDURAL

## 2022-06-24 MED ORDER — MORPHINE SULFATE (PF) 0.5 MG/ML IJ SOLN
INTRAMUSCULAR | Status: AC
  Filled 2022-06-24: qty 10

## 2022-06-24 MED ORDER — SODIUM CHLORIDE 0.9 % IV SOLN
2.0000 g | Freq: Four times a day (QID) | INTRAVENOUS | Status: DC
  Administered 2022-06-24 – 2022-06-25 (×3): 2 g via INTRAVENOUS
  Filled 2022-06-24 (×3): qty 2000

## 2022-06-24 MED ORDER — PROMETHAZINE HCL 25 MG/ML IJ SOLN: 6.2500 mg | INTRAMUSCULAR | Status: DC | PRN

## 2022-06-24 MED ORDER — KETOROLAC TROMETHAMINE 30 MG/ML IJ SOLN
INTRAMUSCULAR | Status: AC
  Filled 2022-06-24: qty 1

## 2022-06-24 MED ORDER — OXYTOCIN-SODIUM CHLORIDE 30-0.9 UT/500ML-% IV SOLN
INTRAVENOUS | Status: AC
  Filled 2022-06-24: qty 500

## 2022-06-24 MED ORDER — OXYCODONE HCL 5 MG/5ML PO SOLN: 5.0000 mg | Freq: Once | ORAL | Status: DC | PRN

## 2022-06-24 MED ORDER — ACETAMINOPHEN 10 MG/ML IV SOLN
INTRAVENOUS | Status: AC
  Filled 2022-06-24: qty 100

## 2022-06-24 MED ORDER — GENTAMICIN SULFATE 40 MG/ML IJ SOLN
5.0000 mg/kg | INTRAVENOUS | Status: DC
  Administered 2022-06-24: 320 mg via INTRAVENOUS
  Filled 2022-06-24 (×2): qty 8

## 2022-06-24 MED ORDER — SODIUM CHLORIDE 0.9 % IV SOLN: INTRAVENOUS | Status: DC | PRN

## 2022-06-24 MED ORDER — BUPIVACAINE HCL (PF) 0.25 % IJ SOLN
INTRAMUSCULAR | Status: DC | PRN
  Administered 2022-06-24: 10 mL via EPIDURAL

## 2022-06-24 MED ORDER — ACETAMINOPHEN 325 MG PO TABS
325.0000 mg | ORAL_TABLET | Freq: Once | ORAL | Status: AC
  Administered 2022-06-24: 325 mg via ORAL
  Filled 2022-06-24: qty 1

## 2022-06-24 MED ORDER — PHENYLEPHRINE HCL (PRESSORS) 10 MG/ML IV SOLN
INTRAVENOUS | Status: DC | PRN
  Administered 2022-06-24 (×3): 160 ug via INTRAVENOUS

## 2022-06-24 MED ORDER — OXYTOCIN-SODIUM CHLORIDE 30-0.9 UT/500ML-% IV SOLN
1.0000 m[IU]/min | INTRAVENOUS | Status: DC
  Administered 2022-06-24: 2 m[IU]/min via INTRAVENOUS
  Filled 2022-06-24: qty 500

## 2022-06-24 MED ORDER — SODIUM CHLORIDE 0.9% FLUSH: 3.0000 mL | INTRAVENOUS | Status: DC | PRN

## 2022-06-24 MED ORDER — SODIUM CHLORIDE 0.9 % IR SOLN
Status: DC | PRN
  Administered 2022-06-24: 1

## 2022-06-24 MED ORDER — LACTATED RINGERS IV SOLN: INTRAVENOUS | Status: DC | PRN

## 2022-06-24 MED ORDER — OXYTOCIN-SODIUM CHLORIDE 30-0.9 UT/500ML-% IV SOLN
INTRAVENOUS | Status: DC | PRN
  Administered 2022-06-24: 30 [IU] via INTRAVENOUS

## 2022-06-24 MED ORDER — OXYCODONE HCL 5 MG PO TABS: 5.0000 mg | ORAL_TABLET | Freq: Once | ORAL | Status: DC | PRN

## 2022-06-24 MED ORDER — MEPERIDINE HCL 25 MG/ML IJ SOLN: 6.2500 mg | INTRAMUSCULAR | Status: DC | PRN

## 2022-06-24 MED ORDER — CEFAZOLIN SODIUM-DEXTROSE 2-4 GM/100ML-% IV SOLN
2.0000 g | INTRAVENOUS | Status: AC
  Administered 2022-06-24: 2 g via INTRAVENOUS

## 2022-06-24 MED ORDER — FENTANYL CITRATE (PF) 100 MCG/2ML IJ SOLN
INTRAMUSCULAR | Status: AC
  Filled 2022-06-24: qty 2

## 2022-06-24 SURGICAL SUPPLY — 35 items
BENZOIN TINCTURE PRP APPL 2/3 (GAUZE/BANDAGES/DRESSINGS) IMPLANT
CHLORAPREP W/TINT 26 (MISCELLANEOUS) ×2 IMPLANT
CLAMP UMBILICAL CORD (MISCELLANEOUS) ×1 IMPLANT
CLOTH BEACON ORANGE TIMEOUT ST (SAFETY) ×1 IMPLANT
DERMABOND ADVANCED .7 DNX12 (GAUZE/BANDAGES/DRESSINGS) IMPLANT
DRSG OPSITE POSTOP 4X10 (GAUZE/BANDAGES/DRESSINGS) ×1 IMPLANT
ELECT REM PT RETURN 9FT ADLT (ELECTROSURGICAL) ×1
ELECTRODE REM PT RTRN 9FT ADLT (ELECTROSURGICAL) ×1 IMPLANT
EXTRACTOR VACUUM KIWI (MISCELLANEOUS) IMPLANT
GAUZE PAD ABD 7.5X8 STRL (GAUZE/BANDAGES/DRESSINGS) IMPLANT
GAUZE SPONGE 4X4 12PLY STRL LF (GAUZE/BANDAGES/DRESSINGS) IMPLANT
GLOVE SURG ORTHO 8.0 STRL STRW (GLOVE) ×1 IMPLANT
GOWN STRL REUS W/TWL LRG LVL3 (GOWN DISPOSABLE) ×2 IMPLANT
KIT ABG SYR 3ML LUER SLIP (SYRINGE) IMPLANT
NDL HYPO 25X5/8 SAFETYGLIDE (NEEDLE) IMPLANT
NEEDLE HYPO 25X5/8 SAFETYGLIDE (NEEDLE) IMPLANT
NS IRRIG 1000ML POUR BTL (IV SOLUTION) ×1 IMPLANT
PACK C SECTION WH (CUSTOM PROCEDURE TRAY) ×1 IMPLANT
PAD OB MATERNITY 4.3X12.25 (PERSONAL CARE ITEMS) ×1 IMPLANT
RTRCTR C-SECT PINK 25CM LRG (MISCELLANEOUS) IMPLANT
SPONGE T-LAP 18X18 ~~LOC~~+RFID (SPONGE) IMPLANT
STRIP CLOSURE SKIN 1/2X4 (GAUZE/BANDAGES/DRESSINGS) IMPLANT
SUT MON AB-0 CT1 36 (SUTURE) ×2 IMPLANT
SUT PLAIN 0 NONE (SUTURE) IMPLANT
SUT PLAIN 2 0 (SUTURE) ×1
SUT PLAIN ABS 2-0 CT1 27XMFL (SUTURE) IMPLANT
SUT VIC AB 0 CT1 27 (SUTURE) ×2
SUT VIC AB 0 CT1 27XBRD ANBCTR (SUTURE) ×2 IMPLANT
SUT VIC AB 2-0 CT1 27 (SUTURE) ×1
SUT VIC AB 2-0 CT1 TAPERPNT 27 (SUTURE) ×1 IMPLANT
SUT VIC AB 4-0 SH 27 (SUTURE) ×1
SUT VIC AB 4-0 SH 27XANBCTRL (SUTURE) ×1 IMPLANT
TOWEL OR 17X24 6PK STRL BLUE (TOWEL DISPOSABLE) ×1 IMPLANT
TRAY FOLEY W/BAG SLVR 14FR LF (SET/KITS/TRAYS/PACK) ×1 IMPLANT
WATER STERILE IRR 1000ML POUR (IV SOLUTION) ×1 IMPLANT

## 2022-06-24 NOTE — Progress Notes (Signed)
Pt seen after evaluation by OB fellow.  Cervix remains unchanged after 3 hours with adequate contractions..  There is significant caput on recent exam.  Pt has been treated for chorioamnionitis, and is currently afebrile.  She has been intermittently tachycardic.   The fetus is also starting to have intermittent late decelerations.  These findings were discussed with the patient and decision was made for primary cesarean delivery.  The risks of surgery were discussed with the patient including but were not limited to: bleeding which may require transfusion or reoperation; infection which may require antibiotics; injury to bowel, bladder, ureters or other surrounding organs; injury to the fetus; need for additional procedures including hysterectomy in the event of a life-threatening hemorrhage; formation of adhesions; placental abnormalities wth subsequent pregnancies; incisional problems; thromboembolic phenomenon and other postoperative/anesthesia complications.  The patient concurred with the proposed plan, giving informed written consent for the procedure.   Patient has been on light laboring diet. she will remain NPO for procedure. Anesthesia and OR aware. Preoperative prophylactic antibiotics and SCDs ordered on call to the OR.  To OR when ready.  Lynnda Shields, MD Faculty attending, Center for [employer].

## 2022-06-24 NOTE — Progress Notes (Signed)
Labor Progress Note  Sara Higgins is a 32 y.o. G3P1011 at [redacted]w[redacted]d presented for IOL for A1GDM and NRFHT.   S:  comfortable with epidural   O:  BP (!) 112/40   Pulse 74   Temp 98.8 F (37.1 C) (Oral)   Resp 18   Ht 5\' 1"  (1.549 m)   Wt 89.4 kg   LMP 09/24/2021   SpO2 100%   BMI 37.22 kg/m  EFM: 135bpm/ min variability/  +ve accels/  no decels (Mostly in moderate variability in the last few hours, cat 1)  CVE: Dilation: 4.5 Effacement (%): 70 Cervical Position: Anterior Station: -2 Presentation: Vertex Exam by:: Dr. Caron Presume   A&P: 32 y.o. [id number] [redacted]w[redacted]d  here for IOL as above #A1GDM - on arrival CBG was 14 - q4hr latent phase, q2hr active phase, hourly second stage #Labor: s/p cyto vag 62mcg x 2, buccal 50 mcg.FB out. AROM with clear fluid, will monitor and start pitocin if needed  #Pain:  Epidural in place   #FWB: CAT 1 #GBS negative  Gifford Shave, MD [facility] Fellow  Faculty Practice 06/24/2022 3:21 AM

## 2022-06-24 NOTE — Progress Notes (Signed)
Labor Progress Note  Sara Higgins is a 32 y.o. G3P1011 at [redacted]w[redacted]d presented for IOL for A1GDM and NRNST.   S:  No acute concerns. Having some pelvic discomfort with contractions.   O:  BP (!) 150/85   Pulse 78   Temp 98.7 F (37.1 C) (Oral)   Resp 18   Ht 5\' 1"  (1.549 m)   Wt 89.4 kg   LMP 09/24/2021   SpO2 100%   BMI 37.22 kg/m   EFM: 145bpm/moderate/+accels, occasional variable  CVE: Dilation: 7 Effacement (%): 80 Cervical Position: Anterior Station: -1 Presentation: Vertex Exam by:: Casilda Carls RN  A&P: 32 y.o. [id number] [redacted]w[redacted]d  here for IOL as above  #Labor: Progressing well s/p AROM @0315 . Pitocin started @0400 . IUPC is in placed and MVUs are adequate #Pain:  Epidural in place   #FWB: Cat II, maternal positioning #GBS negative  #A1GDM Most recent CBG 76 - q4hr latent phase, q2hr active phase, hourly second stage  [name], DO OB Fellow, [place] for [place] 06/24/2022, 12:04 PM

## 2022-06-24 NOTE — Progress Notes (Signed)
Labor Progress Note  Sara Higgins is a 32 y.o. G3P1011 at [redacted]w[redacted]d presented for IOL for A1GDM and NRNST.   S:  No acute concerns.   O:  BP 130/87   Pulse (!) 109   Temp 98.7 F (37.1 C) (Oral)   Resp 18   Ht 5\' 1"  (1.549 m)   Wt 89.4 kg   LMP 09/24/2021   SpO2 100%   BMI 37.22 kg/m   EFM: 120bpm/ moderate/+accels, no decels  CVE: Dilation: 5.5 Effacement (%): 80 Cervical Position: Anterior Station: -1 Presentation: Vertex Exam by:: Dr. Caron Presume   A&P: 32 y.o. [id number] [redacted]w[redacted]d  here for IOL as above  #Labor: s/p AROM @0315 . Pitocin started @0400 . IUPC is in placed and MVUs are 265. Next cervical exam 2 hours from now.  #Pain:  Epidural in place   #FWB: Cat I #GBS negative  #A1GDM Most recent CBG 82 - q4hr latent phase, q2hr active phase, hourly second stage   [name], DO OB Fellow, [place] for [place] 06/24/2022, 9:27 AM

## 2022-06-24 NOTE — Progress Notes (Signed)
Labor Progress Note Sara Higgins is a 32 y.o. G3P1011 at [redacted]w[redacted]d presented for IOL for GDM and NRNST  S:  Feeling frustrated and tired.   O:  BP 109/82   Pulse (!) 118   Temp 99.6 F (37.6 C) (Oral)   Resp 18   Ht 5\' 1"  (1.549 m)   Wt 89.4 kg   LMP 09/24/2021   SpO2 100%   BMI 37.22 kg/m  EFM: baseline 140 bpm/ mod variability/ + accels/ no decels  Toco/IUPC: coupling contractions, ~280-300 MVU  SVE: Dilation: 7 Effacement (%): 90 Cervical Position: Middle Station: -1 Presentation: Vertex Exam by:: Bhambri CNM ?LOT/LOP; +caput Pitocin: 12 mu/min  Latest Reference Range & Units 06/24/22 19:12  Sodium 135 - 145 mmol/L 127 (L)  Potassium 3.5 - 5.1 mmol/L 4.5  Chloride 98 - 111 mmol/L 101  CO2 22 - 32 mmol/L 17 (L)  Glucose 70 - 99 mg/dL 82  BUN 6 - 20 mg/dL 8  Creatinine 0.44 - 1.00 mg/dL 1.74 (H)  Calcium 8.9 - 10.3 mg/dL 7.8 (L)  Anion gap 5 - 15  9  Alkaline Phosphatase 38 - 126 U/L 157 (H)  Albumin 3.5 - 5.0 g/dL 2.3 (L)  AST 15 - 41 U/L 26  ALT 0 - 44 U/L 15  Total Protein 6.5 - 8.1 g/dL 5.7 (L)  Total Bilirubin 0.3 - 1.2 mg/dL 0.6  GFR, Estimated >60 mL/min 39 (L)  (L): Data is abnormally low (H): Data is abnormally high    Intake/Output Summary (Last 24 hours) at 06/24/2022 2031 Last data filed at 06/24/2022 1835 Gross per 24 hour  Intake --  Output 725 ml  Net -725 ml    A/P: 32 y.o. G3P1011 [redacted]w[redacted]d [redacted]w[redacted]d  here for IOL as above   #Labor: Active phase since 3/21 1030. Contraction is adequate for over 14 hours for now. SVE noted to have caput. Plan to give another four hours since last check before discussing C/s #FWB: Cat I  #GBS negative   #Chorio -Continue amp + gent   #A1GDM Most recent CBG 94 -q2hr active phase, hourly second stage   #AKI - low UOP, Cr increase.  - suspect post-renal -  - continue to monitor closely   Seen with Dr. Concepcion Living. [name] "Darlyne Russian, M.D. PGY-2 Family Medicine Visiting Resident Faculty  Practice 06/24/2022 8:32 PM

## 2022-06-24 NOTE — Progress Notes (Signed)
Labor Progress Note Sara Higgins is a 32 y.o. G3P1011 at [redacted]w[redacted]d presented for IOL for GDM and NRNST  S:  Anxious and tired   O:  BP (!) 143/96   Pulse 93   Temp (!) 101.1 F (38.4 C) (Oral)   Resp 18   Ht 5\' 1"  (1.549 m)   Wt 89.4 kg   LMP 09/24/2021   SpO2 100%   BMI 37.22 kg/m  EFM: baseline 155 bpm/ mod variability/ + accels/ Occasiona late decelerations   Toco/IUPC: coupling contractions, ~280-300 MVU  SVE: Dilation: 7 Effacement (%): 90 Cervical Position: Anterior Station: -1 Presentation: Vertex Exam by:: Azar [place]   A/P: 32 y.o. G3P1011 [redacted]w[redacted]d [redacted]w[redacted]d  here for IOL as above   #Labor: Unchanged since prior check. Adequate contractions for the last 3 hours. Looking back patient has been 7 cm since 1030 this morning. Discussed cesarean delivery and patient is agreeable.  #FWB: Cat 2, pitocin turnd off and patient prepped for OR #GBS negative   #Chorio -Continue amp + gent   #A1GDM Most recent CBG 94 -q2hr active phase, hourly second stage   #AKI - low UOP, Cr increase.  - suspect post-renal -  - continue to monitor closely   Gifford Shave, MD [facility] Fellow  Faculty Practice 06/24/2022 10:19 PM

## 2022-06-24 NOTE — Progress Notes (Signed)
Labor Progress Note  Sara Higgins is a 32 y.o. G3P1011 at [redacted]w[redacted]d presented for IOL for A1GDM and NRNST.   S: RN called to report fever. The patient has no acute concerns at this time.   O:  BP 139/83   Pulse (!) 112   Temp (!) 102.6 F (39.2 C) (Axillary)   Resp 18   Ht 5\' 1"  (1.549 m)   Wt 89.4 kg   LMP 09/24/2021   SpO2 100%   BMI 37.22 kg/m   EFM: 175bpm/moderate/+accels, no decels  CVE: Dilation: 8.5 Effacement (%): 90 Cervical Position: Middle Station: -1 Presentation: Vertex Exam by:: k fields, rn  A&P: 32 y.o. [id number] [redacted]w[redacted]d  here for IOL as above  #Labor: Progressing well s/p AROM @0315 . Pitocin started @0400 . IUPC is in placed and MVUs are adequate. Now sitting in throne.  #Pain:  Epidural in place   #FWB: Cat II, fetal tachycardia; IVF bolus and treatment for chorio #GBS negative  Chorio Temp 102.6. 650 mg given.  -Give additional 325 mg -Start amp + gent  #A1GDM Most recent CBG 76 - q4hr latent phase, q2hr active phase, hourly second stage  [name], DO OB Fellow, [place] for [place] 06/24/2022, 1:40 PM

## 2022-06-24 NOTE — Progress Notes (Signed)
Pharmacy Antibiotic Note  Sara Higgins is a 32 y.o. female admitted on 06/23/2022 with  chorioamnionitis .  Pharmacy has been consulted for gentamicin dosing.  Plan: Gentamicin 5 mg/kg IV Q24h  Will obtain gentamicin level if continued beyond 48h or if clinically warrented.  Height: 5\' 1"  (154.9 cm) Weight: 89.4 kg (197 lb) IBW/kg (Calculated) : 47.8  Temp (24hrs), Avg:99 F (37.2 C), Min:97.7 F (36.5 C), Max:102.6 F (39.2 C)  Recent Labs  Lab 06/23/22 1330  WBC 9.0    CrCl cannot be calculated (Patient's most recent lab result is older than the maximum 21 days allowed.).    No Known Allergies  Antimicrobials this admission: Ampicillin 2g Q6h (3/21>> Gentamicin 5 mg/kg IV Q24h (3/21>>  Dose adjustments this admission: N/A  Microbiology results: 2/10: GBS negative  3/5: GBS negative  Thank you for allowing pharmacy to be a part of this patient's care.  Yolanda Bonine, PharmD, MHSA, BCPPS  06/24/2022 1:37 PM

## 2022-06-24 NOTE — Progress Notes (Signed)
Labor Progress Note Sara Higgins is a 32 y.o. G3P1011 at [redacted]w[redacted]d presented for IOL for GDM and NRNST  S:  Feeling ctx pain, feels epidural wearing off.  O:  BP 139/80   Pulse 78   Temp 99.6 F (37.6 C) (Oral)   Resp 18   Ht 5\' 1"  (1.549 m)   Wt 89.4 kg   LMP 09/24/2021   SpO2 100%   BMI 37.22 kg/m  EFM: baseline 140 bpm/ mod variability/ + accels/ no decels  Toco/IUPC: 1-4 SVE: Dilation: 7 Effacement (%): 90 Cervical Position: Middle Station: -1 Presentation: Vertex Exam by:: Janus Molder ?LOT/LOP; +caput Pitocin: 8 mu/min  A/P: 32 y.o. G3P1011 [redacted]w[redacted]d  1. Labor: active, protracted 2. FWB: Cat I 3. Pain: epidural 4. GDM: stable 5. Triple I: afebrile on abx  Recommend Pitocin increase and position changes for fetal head rotation and labor progression. Anticipate SVD.  Julianne Handler, CNM 7:47 PM

## 2022-06-24 NOTE — Progress Notes (Signed)
Labor Progress Note  Sara Higgins is a 32 y.o. G3P1011 at [redacted]w[redacted]d presented for IOL for A1GDM and NRFHT.   S:  Occasionally feeling contractions/  O:  BP 112/78   Pulse 78   Temp 97.7 F (36.5 C) (Oral)   Resp 18   Ht 5\' 1"  (1.549 m)   Wt 89.4 kg   LMP 09/24/2021   SpO2 100%   BMI 37.22 kg/m  EFM: 130bpm/ min variability/  +ve accels/  no decels (Mostly in moderate variability in the last few hours, cat 1)  CVE: Dilation: 1 Effacement (%): 70 Cervical Position: Anterior Station: -3 Presentation: Vertex Exam by:: Dr. Caron Presume   A&P: 32 y.o. [id number] [redacted]w[redacted]d  here for IOL as above #A1GDM - ? Poorly controlled, glucose log were not able to be evaluated in clinic - on arrival CBG was 78 - q4hr latent phase, q2hr active phase, hourly second stage #Labor: s/p cyto vag 30mcg x 2, buccal 50 mcg. Patient received epidural and foley balloon placed/  #Pain:  Epidural in place   #FWB: CAT 1 #GBS negative  Gifford Shave, MD [facility] Fellow  Faculty Practice 06/24/2022 12:28 AM

## 2022-06-24 NOTE — Progress Notes (Signed)
Labor Progress Note  Sara Higgins is a 32 y.o. G3P1011 at [redacted]w[redacted]d presented for IOL for A1GDM and NRNST.   S: No cute concerns.   O:  BP 114/65   Pulse 87   Temp 99.1 F (37.3 C) (Oral)   Resp 18   Ht 5\' 1"  (1.549 m)   Wt 89.4 kg   LMP 09/24/2021   SpO2 100%   BMI 37.22 kg/m   EFM: 145bpm/moderate/+accels, occasional variable  CVE: Dilation: 9 Effacement (%): 90 Cervical Position: Middle Station: -1 Presentation: Vertex Exam by:: Sara Higgins  A&P: 32 y.o. [id number] [redacted]w[redacted]d  here for IOL as above  #Labor: Progressing slowly cervical exam is around 9.5 with some anterior and maternal left cervix left. Contractions appear to be spacing out. Titrate the pitocin up.  #FWB: Cat I, with period of Cat II give IVF bolus #GBS negative  Chorio -Continue amp + gent  #A1GDM Most recent CBG 94 -q2hr active phase, hourly second stage  [name], DO OB Fellow, [place] for [place] 06/24/2022, 4:08 PM

## 2022-06-24 NOTE — Progress Notes (Signed)
Labor Progress Note  Sara Higgins is a 32 y.o. G3P1011 at [redacted]w[redacted]d presented for IOL for A1GDM and NRNST.   S: She is feeling better.   O:  BP 98/79   Pulse (!) 129   Temp (!) 103 F (39.4 C)   Resp 18   Ht 5\' 1"  (1.549 m)   Wt 89.4 kg   LMP 09/24/2021   SpO2 100%   BMI 37.22 kg/m   EFM: 150bpm/moderate/+accels, occasional variable  CVE: Dilation: 9 Effacement (%): 90 Cervical Position: Middle Station: -1 Presentation: Vertex Exam by:: Janus Molder  A&P: 32 y.o. [id number] [redacted]w[redacted]d  here for IOL as above  #Labor: Progressing more cervix on maternal left and I am unable to reduce the anterior lip. Will positioning the patient on left side with peanut ball to encourage descent and cervical dilation.  #FWB: Cat I, with period of Cat II due to occasional variable. #GBS negative  Chorio -Continue amp + gent  #A1GDM Most recent CBG 76 - q4hr latent phase, q2hr active phase, hourly second stage  [name], DO OB Fellow, [place] for [place] 06/24/2022, 2:55 PM

## 2022-06-25 ENCOUNTER — Encounter (HOSPITAL_COMMUNITY): Payer: Self-pay | Admitting: Obstetrics and Gynecology

## 2022-06-25 DIAGNOSIS — O411290 Chorioamnionitis, unspecified trimester, not applicable or unspecified: Secondary | ICD-10-CM | POA: Insufficient documentation

## 2022-06-25 LAB — COMPREHENSIVE METABOLIC PANEL
ALT: 12 U/L (ref 0–44)
AST: 27 U/L (ref 15–41)
Albumin: 1.8 g/dL — ABNORMAL LOW (ref 3.5–5.0)
Alkaline Phosphatase: 117 U/L (ref 38–126)
Anion gap: 9 (ref 5–15)
BUN: 9 mg/dL (ref 6–20)
CO2: 18 mmol/L — ABNORMAL LOW (ref 22–32)
Calcium: 7.8 mg/dL — ABNORMAL LOW (ref 8.9–10.3)
Chloride: 107 mmol/L (ref 98–111)
Creatinine, Ser: 1.48 mg/dL — ABNORMAL HIGH (ref 0.44–1.00)
GFR, Estimated: 48 mL/min — ABNORMAL LOW (ref >60)
Glucose, Bld: 96 mg/dL (ref 70–99)
Potassium: 4.5 mmol/L (ref 3.5–5.1)
Sodium: 134 mmol/L — ABNORMAL LOW (ref 135–145)
Total Bilirubin: 0.5 mg/dL (ref 0.3–1.2)
Total Protein: 5 g/dL — ABNORMAL LOW (ref 6.5–8.1)

## 2022-06-25 LAB — CBC
HCT: 29.5 % — ABNORMAL LOW (ref 36.0–46.0)
Hemoglobin: 9.7 g/dL — ABNORMAL LOW (ref 12.0–15.0)
MCH: 25.7 pg — ABNORMAL LOW (ref 26.0–34.0)
MCHC: 32.9 g/dL (ref 30.0–36.0)
MCV: 78 fL — ABNORMAL LOW (ref 80.0–100.0)
Platelets: 141 10*3/uL — ABNORMAL LOW (ref 150–400)
RBC: 3.78 MIL/uL — ABNORMAL LOW (ref 3.87–5.11)
RDW: 14.6 % (ref 11.5–15.5)
WBC: 25.1 10*3/uL — ABNORMAL HIGH (ref 4.0–10.5)
nRBC: 0 % (ref 0.0–0.2)

## 2022-06-25 LAB — GLUCOSE, CAPILLARY: Glucose-Capillary: 90 mg/dL (ref 70–99)

## 2022-06-25 MED ORDER — WITCH HAZEL-GLYCERIN EX PADS: 1.0000 | MEDICATED_PAD | CUTANEOUS | Status: DC | PRN

## 2022-06-25 MED ORDER — DIBUCAINE (PERIANAL) 1 % EX OINT: 1.0000 | TOPICAL_OINTMENT | CUTANEOUS | Status: DC | PRN

## 2022-06-25 MED ORDER — SIMETHICONE 80 MG PO CHEW
80.0000 mg | CHEWABLE_TABLET | Freq: Three times a day (TID) | ORAL | Status: DC
  Administered 2022-06-25 – 2022-06-27 (×7): 80 mg via ORAL
  Filled 2022-06-25 (×9): qty 1

## 2022-06-25 MED ORDER — ENOXAPARIN SODIUM 40 MG/0.4ML IJ SOSY: 40.0000 mg | PREFILLED_SYRINGE | INTRAMUSCULAR | Status: DC

## 2022-06-25 MED ORDER — ACETAMINOPHEN 500 MG PO TABS
1000.0000 mg | ORAL_TABLET | Freq: Four times a day (QID) | ORAL | Status: DC
  Administered 2022-06-25 – 2022-06-27 (×7): 1000 mg via ORAL
  Filled 2022-06-25 (×9): qty 2

## 2022-06-25 MED ORDER — MENTHOL 3 MG MT LOZG: 1.0000 | LOZENGE | OROMUCOSAL | Status: DC | PRN

## 2022-06-25 MED ORDER — OXYCODONE HCL 5 MG PO TABS
5.0000 mg | ORAL_TABLET | ORAL | Status: DC | PRN
  Administered 2022-06-25 (×2): 5 mg via ORAL
  Administered 2022-06-25: 10 mg via ORAL
  Administered 2022-06-25: 5 mg via ORAL
  Administered 2022-06-26 – 2022-06-27 (×8): 10 mg via ORAL
  Filled 2022-06-25 (×3): qty 2
  Filled 2022-06-25: qty 1
  Filled 2022-06-25 (×7): qty 2

## 2022-06-25 MED ORDER — PRENATAL MULTIVITAMIN CH
1.0000 | ORAL_TABLET | Freq: Every day | ORAL | Status: DC
  Administered 2022-06-25 – 2022-06-27 (×3): 1 via ORAL
  Filled 2022-06-25 (×3): qty 1

## 2022-06-25 MED ORDER — OXYTOCIN-SODIUM CHLORIDE 30-0.9 UT/500ML-% IV SOLN: 2.5000 [IU]/h | INTRAVENOUS | Status: AC

## 2022-06-25 MED ORDER — HYDROMORPHONE HCL 1 MG/ML IJ SOLN
INTRAMUSCULAR | Status: AC
  Filled 2022-06-25: qty 0.5

## 2022-06-25 MED ORDER — GENTAMICIN SULFATE 40 MG/ML IJ SOLN
5.0000 mg/kg | INTRAVENOUS | Status: AC
  Administered 2022-06-25: 320 mg via INTRAVENOUS
  Filled 2022-06-25: qty 8

## 2022-06-25 MED ORDER — DIPHENHYDRAMINE HCL 25 MG PO CAPS: 25.0000 mg | ORAL_CAPSULE | Freq: Four times a day (QID) | ORAL | Status: DC | PRN

## 2022-06-25 MED ORDER — ZOLPIDEM TARTRATE 5 MG PO TABS: 5.0000 mg | ORAL_TABLET | Freq: Every evening | ORAL | Status: DC | PRN

## 2022-06-25 MED ORDER — CLINDAMYCIN PHOSPHATE 900 MG/50ML IV SOLN
900.0000 mg | Freq: Three times a day (TID) | INTRAVENOUS | Status: AC
  Administered 2022-06-25 (×2): 900 mg via INTRAVENOUS
  Filled 2022-06-25 (×2): qty 50

## 2022-06-25 MED ORDER — SIMETHICONE 80 MG PO CHEW: 80.0000 mg | CHEWABLE_TABLET | ORAL | Status: DC | PRN

## 2022-06-25 MED ORDER — SENNOSIDES-DOCUSATE SODIUM 8.6-50 MG PO TABS
2.0000 | ORAL_TABLET | Freq: Every day | ORAL | Status: DC
  Administered 2022-06-26 – 2022-06-27 (×2): 2 via ORAL
  Filled 2022-06-25 (×2): qty 2

## 2022-06-25 MED ORDER — COCONUT OIL OIL
1.0000 | TOPICAL_OIL | Status: DC | PRN
  Administered 2022-06-25: 1 via TOPICAL

## 2022-06-25 NOTE — Op Note (Addendum)
Operative Note   Patient: Sara Higgins  Date of Procedure: 06/24/2022 - 06/25/2022  Procedure: Primary Low Transverse Cesarean   Indications: failure to progress: arrest of dilation at 7 cm  Pre-operative Diagnosis: chorioamnioitis.   Post-operative Diagnosis: Same  TOLAC Candidate: Yes   Surgeon: Surgeon(s) and Role:    * Griffin Basil, MD - Primary    * Caron Presume, Angelyn Punt, MD - Assisting  Assistants: none  An experienced assistant was required given the standard of surgical care given the complexity of the case.  This assistant was needed for exposure, dissection, suctioning, retraction, instrument exchange, assisting with delivery with administration of fundal pressure, and for overall help during the procedure.   Anesthesia: epidural  Anesthesiologist: Lynda Rainwater, MD   Antibiotics: Cefazolin and Azithromycin   Estimated Blood Loss: 878 ml   Total IV Fluids: 2000 ml  Urine Output:  450 cc OF clear urine  Specimens: Placenta to pathology    Complications: no complications   Indications: Sara Higgins is a 32 y.o. [id number] with an IUP [redacted]w[redacted]d presenting for unscheduled, urgent cesarean secondary to the indications listed above. Clinical course notable for Induction with Cytotec, Foley balloon, AROM, Pitocin.  She progressed to 7 cm dilation and although was having adequate contractions and no change for many hours and was deemed arrest of dilation.  Patient taken to the OR.  The risks of cesarean section discussed with the patient included but were not limited to: bleeding which may require transfusion or reoperation; infection which may require antibiotics; injury to bowel, bladder, ureters or other surrounding organs; injury to the fetus; need for additional procedures including hysterectomy in the event of a life-threatening hemorrhage; placental abnormalities with subsequent pregnancies, incisional problems, thromboembolic phenomenon and other  postoperative/anesthesia complications. The patient concurred with the proposed plan, giving informed written consent for the procedure. Patient NPO status waived given urgency of case. Anesthesia and OR aware. Preoperative prophylactic antibiotics and SCDs ordered on call to the OR.    Findings: Viable infant in cephalic, OP presentation, no nuchal cord present. Apgars 1 , 6 , 9 . Weight 3600 g . Clear amniotic fluid. Normal placenta, three vessel cord. Normal uterus, Normal bilateral fallopian tubes, Normal bilateral ovaries.  Procedure Details: A Time Out was held and the above information confirmed. The patient received intravenous antibiotics and had sequential compression devices applied to her lower extremities preoperatively. The patient was taken back to the operative suite where epidural anesthesia was administered. After induction of anesthesia, the patient was draped and prepped in the usual sterile manner and placed in a dorsal supine position with a leftward tilt. A low transverse skin incision was made with scalpel and carried down through the subcutaneous tissue to the fascia. Fascial incision was made and extended transversely. The fascia was separated from the underlying rectus tissue superiorly and inferiorly. The rectus muscles were separated in the midline bluntly and the peritoneum was entered bluntly. A Rich retractor and bladder blade were placed to aid in visualization of the uterus. The utero-vesical peritoneal reflection was incised transversely and the bladder flap was bluntly freed from the lower uterine segment. A low transverse uterine incision was made. The infant was successfully delivered from cephalic, home PT presentation, this was an extremely difficult delivery which was ultimately effaced presentation to the hysterotomy., the umbilical cord was clamped immediately. Cord ph was sent, and cord blood was obtained for evaluation. The placenta was removed Intact and appeared  normal.  There  were extensions appreciated on both sides but worse on the right side than the left side.  Your incision was closed starting on the left aspect of the uterus with a 0 Monocryl suture in a running locked fashion.  An additional 0 Monocryl suture was used starting on the right extension and close to the middle.  Multiple figure-of-eight sutures were placed for added hemostasis.  In addition, a layer of Arista was placed. The abdomen and the pelvis were cleared of all clot and debris and the Ubaldo Glassing was removed. Hemostasis was confirmed on all surfaces.  The peritoneum was reapproximated using 2-0 vicryl . The fascia was then closed using 0 Vicryl in a running fashion. The subcutaneous layer was reapproximated with 2-0 plain gut suture. The skin was closed with a 4-0 vicryl subcuticular stitch. The patient tolerated the procedure well. Sponge, lap, instrument and needle counts were correct x 2. She was taken to the recovery room in stable condition.  Disposition: PACU - hemodynamically stable.    Signed: Concepcion Living, MD [facility] Fellow  Center for [facility], [place]

## 2022-06-25 NOTE — Progress Notes (Addendum)
Attestation of Attending Supervision of Resident: Evaluation and management procedures were performed by the [facility] Medicine Resident under my supervision. I was immediately available for direct supervision, assistance and direction throughout this encounter.  I also confirm that I have verified the information documented in the resident's note, and that I have also personally reperformed the pertinent components of the physical exam and all of the medical decision making activities.  I have also made any necessary editorial changes.  On my afternoon evaluation, patient was feeling well with pain controlled. She had ambulated around the hallways. Foley removed and awaiting SV. Not yet passing flatus. On exam, fundus was firm & below umbilicus. Pressure dressing in place.   Of note, we are holding NSAIDs, lasix & prophylactic lovenox due to AKI with reduced GFR. Her UOP has been excellent and the patient has voided 4L today. Pending her Cr tomorrow AM, we can potentially resume these medications. We will also monitor her electrolytes and replete prn.  Sara Catalina, MD Attending [place], [facility] for [facility] 06/25/2022 6:03 PM  POSTPARTUM PROGRESS NOTE  POD #1  Subjective:  Sara Higgins is a 32 y.o. [id number] s/p LTCS for failure to progress at [redacted]w[redacted]d.  She reports she doing well. No acute events overnight. She reports she is doing well, still having some pain around the incision site. She has not ambulated today but plans to get up to void later. Vomited once after the c-section. She has not passed flatus. Pain is moderately controlled.  Lochia is appropriate.  Objective: Blood pressure 122/80, pulse 86, temperature 97.9 F (36.6 C), temperature source Oral, resp. rate 18, height 5\' 1"  (1.549 m), weight 89.4 kg, last menstrual period 09/24/2021, SpO2 98 %, unknown if currently breastfeeding.  Physical Exam:   General: alert, cooperative and no distress Chest: no respiratory distress Heart:regular rate, distal pulses intact Abdomen: soft, nontender,  Uterine Fundus: firm, appropriately tender DVT Evaluation: No calf swelling or tenderness Extremities: moderate edema Skin: warm, dry; incision clean/dry/intact w/ honeycomb dressing in place  Recent Labs    06/23/22 1330 06/25/22 0413  HGB 12.8 9.7*  HCT 40.3 29.5*    Assessment/Plan: Sara Higgins is a 32 y.o. [id number] s/p LTCS at [redacted]w[redacted]d for failure to progress.  POD#1 - Doing welll; pain moderately controlled. H/H appropriate  Routine postpartum care  OOB, encouraged  #Anemia: asymptomatic - Start po ferrous sulfate BID  #Chorioamnionitis Afebrile since delivery. WBC 25.1.  - Amp and Gent for 24 hrs pp  #AKI Cr 1.74 prior to delivery. Improved to 1.48, likely obstructive AKI. Urine output improving.  #A1GMD -CBG appropriate, diet controlled.  Contraception: POPs Feeding: breast  Dispo: Plan for discharge after ambulating, tolerating PO, voiding and pain controlled on PO meds.   LOS: 2 days   [name], MD Family Medicine, PGY1 06/25/2022, 7:27 AM

## 2022-06-25 NOTE — Anesthesia Postprocedure Evaluation (Signed)
Anesthesia Post Note  Patient: Sara Higgins  Procedure(s) Performed: CESAREAN SECTION (Abdomen)     Patient location during evaluation: PACU Anesthesia Type: Epidural Level of consciousness: awake and alert Pain management: pain level controlled Vital Signs Assessment: post-procedure vital signs reviewed and stable Respiratory status: spontaneous breathing, nonlabored ventilation and respiratory function stable Cardiovascular status: blood pressure returned to baseline and stable Postop Assessment: no apparent nausea or vomiting Anesthetic complications: no   No notable events documented.  Last Vitals:  Vitals:   06/25/22 0100 06/25/22 0115  BP: (!) 124/95 119/84  Pulse: 98 89  Resp: 19 (!) 27  Temp: 37.1 C   SpO2: 98% 99%    Last Pain:  Vitals:   06/25/22 0115  TempSrc:   PainSc: 7    Pain Goal: Patients Stated Pain Goal: 0 (06/24/22 0557)  LLE Motor Response: Purposeful movement (06/25/22 0115) LLE Sensation: Tingling (06/25/22 0115) RLE Motor Response: Purposeful movement (06/25/22 0115) RLE Sensation: Tingling (06/25/22 0115)     Epidural/Spinal Function Cutaneous sensation: Able to Wiggle Toes (06/25/22 0115), Patient able to flex knees: Yes (06/25/22 0115), Patient able to lift hips off bed: Yes (06/25/22 0115), Back pain beyond tenderness at insertion site: No (06/25/22 0115), Progressively worsening motor and/or sensory loss: No (06/25/22 0115), Bowel and/or bladder incontinence post epidural: No (06/25/22 0115)  Lynda Rainwater

## 2022-06-25 NOTE — Progress Notes (Signed)
CSW received consult for anxiety and adjustment disorder. CSW met MOB at bedside to complete mental health assessment. CSW entered room, introduced herself and acknowledged FOB was present. MOB gave CSW verbal permission to speak about anything personal while; FOB was present. CSW explained her role and the reason for the visit. MOB presented as calm, was agreeable to consult and remained engaged throughout encounter.  CSW asked MOB about her mental health history. MOB reported experiencing anxiety but has never been diagnosed. MOB reported feeling stressed in the past, but she is able to manage it. CSW asked MOB about being diagnosed with adjustment disorder. MOB reported not knowing what adjustment disorder is; CSW explained adjustment disorder to help clarify.  MOB reported at times she feels overwhelmed with life stressors, but is able to cope. MOB reported not being prescribed medication or participating in therapy to manage symptoms. CSW asked MOB would therapist resources be helpful for the future; MOB said yes. CSW provided therapist resources in the [place] area.  CSW asked MOB has she chosen a pediatrician for infant to attend follow visits; MOB said [facility]. MOB reported having all essential items for infant, including a car seat and bassinet.  CSW identifies no further need for intervention and no barriers to discharge at this time.   Lorrine Kin, [place] Social Worker [phone]

## 2022-06-25 NOTE — Lactation Note (Signed)
This note was copied from a baby's chart. Lactation Consultation Note  Patient Name: Sara Higgins [id number] Date: 06/25/2022 Age:34 hours Reason for consult: Follow-up assessment;1st time breastfeeding;Term;Breastfeeding assistance LC offered to assist, mom receptive. LC changed a large wet and a large full diaper. Baby more awake and LC placed STS to the right breast / football and baby latched for 10 mins with good swallows and released.  LC assisted to latch on the left breast , few sucks and released/  LC reviewed Breast feeding basics and reassured  mom of all the positives. Latch score 8    Maternal Data Has patient been taught Hand Expression?: Yes (several drops) Does the patient have breastfeeding experience prior to this delivery?: No  Feeding Mother's Current Feeding Choice: Breast Milk and Formula  LATCH Score Latch: Grasps breast easily, tongue down, lips flanged, rhythmical sucking.  Audible Swallowing: A few with stimulation  Type of Nipple: Everted at rest and after stimulation  Comfort (Breast/Nipple): Soft / non-tender  Hold (Positioning): Assistance needed to correctly position infant at breast and maintain latch.  LATCH Score: 8   Lactation Tools Discussed/Used    Interventions Interventions: Breast feeding basics reviewed;Education;Assisted with latch;Skin to skin;Breast massage;Hand express;Reverse pressure;Breast compression;Adjust position;Support pillows;Position options  Discharge Pump: [employer] Pump (per [employer] out iof network for [place] [employer])  Consult Status Consult Status: Follow-up Date: 06/26/22 Follow-up type: In-patient    [place] 06/25/2022, 3:12 PM

## 2022-06-25 NOTE — Discharge Summary (Shared)
Postpartum Discharge Summary  Date of Service updated***     Patient Name: Sara Higgins DOB: [birth year] MRN: [id number]  Date of admission: 06/23/2022 Delivery date:06/24/2022  Delivering provider: Concepcion Living  Date of discharge: 06/25/2022  Admitting diagnosis: Gestational diabetes mellitus (GDM) [O24.419] Intrauterine pregnancy: [redacted]w[redacted]d     Secondary diagnosis:  Principal Problem:   Gestational diabetes mellitus (GDM) Active Problems:   Encounter for induction of labor   Supervision of other normal pregnancy, antepartum   Obesity affecting pregnancy, antepartum   Decreased fetal movement affecting management of mother, antepartum  Additional problems: ***    Discharge diagnosis: Term Pregnancy Delivered and GDM A1                                              Post partum procedures:{Postpartum procedures:23558} Augmentation: AROM, Pitocin, Cytotec, and IP Foley Complications: Intrauterine Inflammation or infection (Chorioamniotis)  Hospital course: Induction of Labor With Cesarean Section   32 y.o. yo [id number] at [redacted]w[redacted]d was admitted to the hospital 06/23/2022 for induction of labor. Patient had a labor course significant for progression to 7 cm.  Patient did develop chorioamnionitis.  Dilation stalled at 7 cm.  Patient was noted to start having occasional late decelerations.. The patient went for cesarean section due to Arrest of Dilation and Non-Reassuring FHR. Delivery details are as follows: Membrane Rupture Time/Date: 3:17 AM ,06/24/2022   Delivery Method:C-Section, Low Transverse  Details of operation can be found in separate operative Note.  Patient had a postpartum course complicated by***. She is ambulating, tolerating a regular diet, passing flatus, and urinating well.  Patient is discharged home in stable condition on 06/25/22.      Newborn Data: Birth date:06/24/2022  Birth time:11:09 PM  Gender:Female  Living status:Living  Apgars:1 ,6  Weight:3600 g                                 Magnesium Sulfate received: No BMZ received: No Rhophylac:N/A MMR:No T-DaP:Given prenatally Flu: [id number] Transfusion:{Transfusion received:30440034}  Physical exam  Vitals:   06/24/22 2101 06/24/22 2108 06/24/22 2131 06/25/22 0030  BP: 107/64  (!) 143/96 117/80  Pulse: 93   100  Resp:    19  Temp:  (!) 101.1 F (38.4 C)  97.7 F (36.5 C)  TempSrc:  Oral    SpO2:    96%  Weight:      Height:       General: {Exam; general:21111117} Lochia: {Desc; appropriate/inappropriate:30686::"appropriate"} Uterine Fundus: {Desc; firm/soft:30687} Incision: {Exam; incision:21111123} DVT Evaluation: {Exam; dvt:2111122} Labs: Lab Results  Component Value Date   WBC 9.0 06/23/2022   HGB 12.8 06/23/2022   HCT 40.3 06/23/2022   MCV 78.3 (L) 06/23/2022   PLT 186 06/23/2022      Latest Ref Rng & Units 06/24/2022    7:12 PM  CMP  Glucose 70 - 99 mg/dL 82   BUN 6 - 20 mg/dL 8   Creatinine 0.44 - 1.00 mg/dL 1.74   Sodium 135 - 145 mmol/L 127   Potassium 3.5 - 5.1 mmol/L 4.5   Chloride 98 - 111 mmol/L 101   CO2 22 - 32 mmol/L 17   Calcium 8.9 - 10.3 mg/dL 7.8   Total Protein 6.5 - 8.1 g/dL 5.7   Total Bilirubin 0.3 - 1.2  mg/dL 0.6   Alkaline Phos 38 - 126 U/L 157   AST 15 - 41 U/L 26   ALT 0 - 44 U/L 15    Edinburgh Score:     No data to display           After visit meds:  Allergies as of 06/25/2022   No Known Allergies   Med Rec must be completed prior to using this [facility]***        Discharge home in stable condition Infant Feeding: {Baby feeding:23562} Infant Disposition:{CHL IP OB HOME WITH DX:[id number] Discharge instruction: per After Visit Summary and Postpartum booklet. Activity: Advance as tolerated. Pelvic rest for 6 weeks.  Diet: {OB BY:630183 Future Appointments: Future Appointments  Date Time Provider [place]  07/01/2022 10:35 AM Chancy Milroy, MD [place] None   Follow up Visit: Message  sent  Please schedule this patient for a In person postpartum visit in 6 weeks with the following provider: Any provider. Additional Postpartum F/U:2 hour GTT and Incision check 1 week  High risk pregnancy complicated by: GDM Delivery mode:  C-Section, Low Transverse  Anticipated Birth Control:  POPs   06/25/2022 Concepcion Living, MD

## 2022-06-25 NOTE — Progress Notes (Signed)
low

## 2022-06-25 NOTE — Transfer of Care (Signed)
Immediate Anesthesia Transfer of Care Note  Patient: Sara Higgins  Procedure(s) Performed: CESAREAN SECTION (Abdomen)  Patient Location: PACU  Anesthesia Type:Epidural  Level of Consciousness: awake, alert , and oriented  Airway & Oxygen Therapy: Patient Spontanous Breathing  Post-op Assessment: Report given to RN and Post -op Vital signs reviewed and stable  Post vital signs: Reviewed and stable  Last Vitals:  Vitals Value Taken Time  BP    Temp    Pulse 112 06/25/22 0030  Resp 23 06/25/22 0030  SpO2 98 % 06/25/22 0030  Vitals shown include unvalidated device data.  Last Pain:  Vitals:   06/24/22 2108  TempSrc: Oral  PainSc:       Patients Stated Pain Goal: 0 ([id number])  Complications: No notable events documented.

## 2022-06-25 NOTE — Lactation Note (Signed)
This note was copied from a baby's chart. Lactation Consultation Note  Patient Name: Sara Higgins [id number] Date: 06/25/2022 Age:33 hours Reason for consult: Breastfeeding assistance;1st time breastfeeding;Term;Initial assessment LC assisted Birth Parent will doing reverse pressure softening to ever nipple outward prior to latch, Birth Parent latched infant on her right breast using the cross cradle hold, infant latches well but came off when Birth Parent would bring infant away from breast concern that infant can not breathe, LC did teaching and showed images of what a good latch looks like. When infant's nose was closed to breast, latch was good, audible swallowing observe, infant BF for 12 minutes. Birth Parent will continue to work towards latching infant at breast and continue to ask RN/LC for latch assistance. Birth Parent will continue to BF infant according to hunger cues, on demand, 8 to 12+ times within 24 hours, skin to skin. LC discussed infant's input and output. Importance of maternal rest, diet and hydration. Birth Parent was made aware of O/P services, breastfeeding support groups, community resources, and our phone # for post-discharge questions.    Maternal Data Has patient been taught Hand Expression?: Yes  Feeding Mother's Current Feeding Choice: Breast Milk  LATCH Score Latch: Repeated attempts needed to sustain latch, nipple held in mouth throughout feeding, stimulation needed to elicit sucking reflex.  Audible Swallowing: A few with stimulation  Type of Nipple: Flat  Comfort (Breast/Nipple): Soft / non-tender  Hold (Positioning): Assistance needed to correctly position infant at breast and maintain latch.  LATCH Score: 6   Lactation Tools Discussed/Used    Interventions Interventions: Breast feeding basics reviewed;Adjust position;Assisted with latch;Support pillows;Education;Position options;Skin to skin;Breast compression;Reverse pressure;Hand  express;Breast massage;LC Services brochure  Discharge Pump:  [facility] sent referal for Jonnie Kind)  Consult Status Consult Status: Follow-up Date: 06/25/22 Follow-up type: In-patient    Eulis Canner 06/25/2022, 2:48 AM

## 2022-06-26 LAB — CBC WITH DIFFERENTIAL/PLATELET
Abs Immature Granulocytes: 0.28 10*3/uL — ABNORMAL HIGH (ref 0.00–0.07)
Basophils Absolute: 0 10*3/uL (ref 0.0–0.1)
Basophils Relative: 0 %
Eosinophils Absolute: 0.1 10*3/uL (ref 0.0–0.5)
Eosinophils Relative: 0 %
HCT: 24.6 % — ABNORMAL LOW (ref 36.0–46.0)
Hemoglobin: 8 g/dL — ABNORMAL LOW (ref 12.0–15.0)
Immature Granulocytes: 1 %
Lymphocytes Relative: 7 %
Lymphs Abs: 1.7 10*3/uL (ref 0.7–4.0)
MCH: 25.6 pg — ABNORMAL LOW (ref 26.0–34.0)
MCHC: 32.5 g/dL (ref 30.0–36.0)
MCV: 78.8 fL — ABNORMAL LOW (ref 80.0–100.0)
Monocytes Absolute: 1.3 10*3/uL — ABNORMAL HIGH (ref 0.1–1.0)
Monocytes Relative: 6 %
Neutro Abs: 19.8 10*3/uL — ABNORMAL HIGH (ref 1.7–7.7)
Neutrophils Relative %: 86 %
Platelets: 153 10*3/uL (ref 150–400)
RBC: 3.12 MIL/uL — ABNORMAL LOW (ref 3.87–5.11)
RDW: 14.9 % (ref 11.5–15.5)
WBC: 23.2 10*3/uL — ABNORMAL HIGH (ref 4.0–10.5)
nRBC: 0 % (ref 0.0–0.2)

## 2022-06-26 LAB — BASIC METABOLIC PANEL
Anion gap: 14 (ref 5–15)
BUN: 9 mg/dL (ref 6–20)
CO2: 22 mmol/L (ref 22–32)
Calcium: 8.5 mg/dL — ABNORMAL LOW (ref 8.9–10.3)
Chloride: 100 mmol/L (ref 98–111)
Creatinine, Ser: 1.17 mg/dL — ABNORMAL HIGH (ref 0.44–1.00)
GFR, Estimated: >60 mL/min (ref >60)
Glucose, Bld: 21 mg/dL — CL (ref 70–99)
Potassium: 3.6 mmol/L (ref 3.5–5.1)
Sodium: 136 mmol/L (ref 135–145)

## 2022-06-26 LAB — GLUCOSE, CAPILLARY: Glucose-Capillary: 90 mg/dL (ref 70–99)

## 2022-06-26 MED ORDER — FERROUS SULFATE 325 (65 FE) MG PO TABS
325.0000 mg | ORAL_TABLET | ORAL | Status: DC
  Administered 2022-06-26: 325 mg via ORAL
  Filled 2022-06-26: qty 1

## 2022-06-26 NOTE — Progress Notes (Signed)
MOB was referred due to anxious presentation. CSW met with patient 06/25/22 and completed clinical mental health assessment. Per MOB's provider and RN, pt has presented as appropriate on 06/26/22.  Repeat referral screened out.   Please contact the Clinical Social Worker if needs arise, by [facility] request, or if MOB scores greater than 9/yes to question 10 on Edinburgh Postpartum Depression Screen.  Signed,  Berniece Salines, MSW, LCSWA, LCASA 06/26/2022 7:10 PM

## 2022-06-26 NOTE — Lactation Note (Signed)
This note was copied from a baby's chart. Lactation Consultation Note  Patient Name: Sara Higgins [id number] Date: 06/26/2022 Age:34 hours Reason for consult: Follow-up assessment;Nipple pain/trauma;Infant weight loss;1st time breastfeeding;Term;Breastfeeding assistance (6 % weight loss) Mom and RN requested LC  Baby more awake than yesterday,  LC offered to assist , mom receptive., LC 1st placed baby on the left breast and baby sleepy. LC switched to the right breast, more awake and latched with depth, fed 15 mins with swallows. Initially per mom some discomfort and then improved. Baby needed a lot of stimulation to stay in a good feeding pattern. More swallows today than yesterday.  LC did noted a short skin  notch under the mid section of the tongue when she was crying.  LC Plan:  Breast shells while awake between feedings due to soreness and coconut oil  Steps for latching - to enhance the volume to the baby.  Breast massage, hand express, pre- pump 10-20 strokes to prime the milk ducts to help the baby stay in a good feeding pattern.  Offer the 2nd breast after the 1st.  Post pump with the DEBP dad is bringing .  LC explained post pumping can be a slow process and its for extra stimulation.  LC recommended and encouraged to feed with feeding cues and by 3 hours STS, if the baby is sluggish due to age , may need to be supplemented.  LC provided report to [place]  Maternal Data Has patient been taught Hand Expression?: Yes  Feeding Mother's Current Feeding Choice: Breast Milk and Formula  LATCH Score Latch: Grasps breast easily, tongue down, lips flanged, rhythmical sucking.  Audible Swallowing: A few with stimulation (increase to 2)  Type of Nipple: Everted at rest and after stimulation (some areola edema , iimproved compared to yesterday)  Comfort (Breast/Nipple): Soft / non-tender  Hold (Positioning): Assistance needed to correctly position infant at breast and  maintain latch.  LATCH Score: 8   Lactation Tools Discussed/Used Tools: Pump;Flanges Flange Size: 24;21;Other (comment) (#24 F more comfortable per mom) Breast pump type: Manual Pump Education: Setup, frequency, and cleaning;Milk Storage Reason for Pumping: LC recomemmended prior to latching - breast massage, hand express, pre-pump and reverse pressure due to some areola edema and to help the baby to increase feeding pattern  Interventions Interventions: Breast feeding basics reviewed;Assisted with latch;Skin to skin;Breast massage;Hand express;Reverse pressure;Breast compression;Adjust position;Support pillows;Position options;Hand pump;Education  Discharge Pump: Personal;Hands Free (mokm had dad go to buy a DEBP - hands free)  Consult Status Consult Status: Follow-up Date: 06/26/22 Follow-up type: In-patient    [place] 06/26/2022, 9:13 AM

## 2022-06-26 NOTE — Progress Notes (Signed)
POSTPARTUM PROGRESS NOTE  POD #2  Subjective:  Sara Higgins is a 32 y.o. [id number] s/p pLTCS at [redacted]w[redacted]d.  She reports she doing well. No acute events overnight. She reports she is doing well. She denies any problems with ambulating, voiding or po intake. Denies nausea or vomiting. She has passed flatus. Pain is moderately controlled.  Lochia is appropriate.  Objective: Blood pressure 128/88, pulse (!) 110, temperature 98.5 F (36.9 C), temperature source Oral, resp. rate 18, height 5\' 1"  (1.549 m), weight 89.4 kg, last menstrual period 09/24/2021, SpO2 100 %, unknown if currently breastfeeding.  Physical Exam:  General: alert, cooperative and no distress Chest: no respiratory distress Heart:regular rate, distal pulses intact Abdomen: soft, nontender,  Uterine Fundus: firm, appropriately tender DVT Evaluation: No calf swelling or tenderness Extremities: +2 edema Skin: warm, dry; incision clean/dry/intact w/ pressure dressing in place  Recent Labs    06/25/22 0413 06/26/22 0423  HGB 9.7* 8.0*  HCT 29.5* 24.6*    Assessment/Plan: Sara Higgins is a 32 y.o. [id number] s/p pLTCS at [redacted]w[redacted]d for arrest of dilation .  POD#2 - Doing welll; pain moderately controlled. H/H appropriate  Routine postpartum care  OOB, ambulated  Lovenox for VTE prophylaxis Anemia: asymptomatic  Start po ferrous sulfate BID  Contraception: POPs Feeding: breast  Dispo: Plan for discharge tomorrow.   LOS: 3 days   Gifford Shave, MD  OB Fellow  06/26/2022, 7:48 AM

## 2022-06-26 NOTE — Lactation Note (Signed)
This note was copied from a baby's chart. Lactation Consultation Note  Patient Name: Sara Higgins [id number] Date: 06/26/2022 Age:34 hours Reason for consult: Mother's request;Term Mom needed assistance in obtaining deep latch. Demonstrated lip flange and chin tug. Mom stated helpful. Noted milk transfer from breast. Mom excited. Mom very sleepy from all of the breast feeding. Baby is cluster feeding. Answered the questions mom had. Praised mom for good BF. Assisted in latching, breast massage, hand expression. Newborn feeding habits, behavior, STS, I&O, reviewed.  Encouraged to call for assistance as needed.   Maternal Data Has patient been taught Hand Expression?: Yes  Feeding    LATCH Score Latch: Grasps breast easily, tongue down, lips flanged, rhythmical sucking.  Audible Swallowing: A few with stimulation  Type of Nipple: Everted at rest and after stimulation  Comfort (Breast/Nipple): Soft / non-tender  Hold (Positioning): Assistance needed to correctly position infant at breast and maintain latch.  LATCH Score: 8   Lactation Tools Discussed/Used    Interventions Interventions: Breast feeding basics reviewed;Adjust position;Assisted with latch;Support pillows;Skin to skin;Position options;Hand express;Breast compression  Discharge    Consult Status Consult Status: Follow-up Date: 06/26/22 (in pm) Follow-up type: In-patient    Theodoro Kalata 06/26/2022, 2:45 AM

## 2022-06-27 LAB — BASIC METABOLIC PANEL
Anion gap: 8 (ref 5–15)
BUN: 7 mg/dL (ref 6–20)
CO2: 21 mmol/L — ABNORMAL LOW (ref 22–32)
Calcium: 8.4 mg/dL — ABNORMAL LOW (ref 8.9–10.3)
Chloride: 106 mmol/L (ref 98–111)
Creatinine, Ser: 1.01 mg/dL — ABNORMAL HIGH (ref 0.44–1.00)
GFR, Estimated: >60 mL/min (ref >60)
Glucose, Bld: 109 mg/dL — ABNORMAL HIGH (ref 70–99)
Potassium: 3.7 mmol/L (ref 3.5–5.1)
Sodium: 135 mmol/L (ref 135–145)

## 2022-06-27 MED ORDER — FUROSEMIDE 20 MG PO TABS
20.0000 mg | ORAL_TABLET | Freq: Every day | ORAL | Status: DC
  Administered 2022-06-27: 20 mg via ORAL
  Filled 2022-06-27: qty 1

## 2022-06-27 MED ORDER — IBUPROFEN 600 MG PO TABS
600.0000 mg | ORAL_TABLET | Freq: Four times a day (QID) | ORAL | Status: DC | PRN
  Administered 2022-06-27: 600 mg via ORAL
  Filled 2022-06-27: qty 1

## 2022-06-27 MED ORDER — ACETAMINOPHEN 500 MG PO TABS: 1000.0000 mg | ORAL_TABLET | Freq: Four times a day (QID) | ORAL | 0 refills | Status: DC | PRN

## 2022-06-27 MED ORDER — OXYCODONE HCL 5 MG PO TABS: 5.0000 mg | ORAL_TABLET | Freq: Four times a day (QID) | ORAL | 0 refills | Status: DC | PRN

## 2022-06-27 MED ORDER — FUROSEMIDE 20 MG PO TABS: 20.0000 mg | ORAL_TABLET | Freq: Every day | ORAL | 0 refills | Status: DC

## 2022-06-27 MED ORDER — IBUPROFEN 600 MG PO TABS: 600.0000 mg | ORAL_TABLET | Freq: Four times a day (QID) | ORAL | 0 refills | Status: DC | PRN

## 2022-06-27 NOTE — Progress Notes (Addendum)
Rn called Dr Allena Katz for bp parameters. Parameters to call MD are  160/100.

## 2022-06-28 ENCOUNTER — Other Ambulatory Visit: Payer: Self-pay | Admitting: *Deleted

## 2022-06-28 LAB — SURGICAL PATHOLOGY

## 2022-06-28 NOTE — Progress Notes (Signed)
BP 129/86 and "non compliance" reported by [employer] system. BP is not out of range.

## 2022-06-30 ENCOUNTER — Telehealth: Payer: Self-pay

## 2022-06-30 ENCOUNTER — Other Ambulatory Visit (INDEPENDENT_AMBULATORY_CARE_PROVIDER_SITE_OTHER): Payer: Self-pay | Admitting: Advanced Practice Midwife

## 2022-06-30 NOTE — Telephone Encounter (Signed)
S/w pt and advised that provider reviewed picture of incision and it does not appear to be open, appears to show normal healing. Advised to keep incision clean and dry Pt has in office appt tomorrow

## 2022-06-30 NOTE — Telephone Encounter (Signed)
S/w pt and she advised that BP today is 152/87 but pt expressed a lot of anxiety. Advised pt to sit and relax for at least 10 minutes and re take BP and call back, pt agreed.

## 2022-06-30 NOTE — Telephone Encounter (Signed)
Called pt to f/u on babyscripts BP alert yesterday of 140/89, no answer, left vm

## 2022-06-30 NOTE — Telephone Encounter (Signed)
Returned call, no answer, left vm 

## 2022-06-30 NOTE — Telephone Encounter (Signed)
Pt returned call and stated that her BP is 130/87 and pt states that she thinks her incision is opening. Advised pt to send a picture of incision in mychart to see she should be evlauted today or tomorrow at her appointment. Pt agreed.

## 2022-07-01 ENCOUNTER — Encounter: Payer: Medicaid Other | Admitting: Obstetrics and Gynecology

## 2022-07-01 ENCOUNTER — Ambulatory Visit: Payer: Medicaid Other | Admitting: *Deleted

## 2022-07-01 VITALS — BP 130/91 | HR 83 | Wt 181.0 lb

## 2022-07-01 DIAGNOSIS — G8918 Other acute postprocedural pain: Secondary | ICD-10-CM

## 2022-07-01 MED ORDER — OXYCODONE-ACETAMINOPHEN 5-325 MG PO TABS: 1.0000 | ORAL_TABLET | ORAL | 0 refills | Status: DC | PRN

## 2022-07-01 NOTE — Progress Notes (Signed)
[employer] contacted on pt's behalf. They were able to identify and error that occurred on there end. The problem was corrected and they requested and were granted approval by pt's insurance. The order for the breast pump was received in the office, signed by Dr. Rip Harbour and faxed back to [employer]. Pt notified via TC and advised to expect and respond to email from [employer]. Pt notified pump would be sent promptly once product was selected. Pt encouraged to reach out to [place] for any difficulties with pumping or feeding and we would assist or refer her as appropriate.

## 2022-07-01 NOTE — Progress Notes (Signed)
Pt reports difficulty with breastfeeding and latching infant. Has appt scheduled for in home lactation support next week. Does not have breast pump. Reports problem with getting pump through [employer]. RN to contact [employer] for clarification and resolution. If unable to provide pump for pt then [facility] at [facility] will be contacted for further direction. Pt encouraged to continue to put baby to breast frequently and hand express as needed. Pt verbalized understanding.

## 2022-07-01 NOTE — Progress Notes (Signed)
Subjective:     Sara Higgins is a 32 y.o. female who presents to the clinic 1 weeks status post low uterine, transverse cesarean section. Pt reports incision is healing well.      Objective:    Wt 181 lb (82.1 kg)   LMP 09/24/2021   BMI 34.20 kg/m  General:  alert, well appearing, in no apparent distress, oriented to person, place and time  Incision:   healing well, no drainage, no hernia, no seroma, no swelling, well approximated, slight edema and erythema noted distal right side of incision, no dehiscence, incision well approximated     Assessment:    Doing well postoperatively. Reports pain that interferes with ability to move around freely and care for infant. Was advised to only take 2 Ibuprofen in 24 hour period. Has been taking  oxy ir 1 during the day and 2 at night but is now out of med. Consulted Dr. Jodi Mourning. He will RX #20 additional oxy ir.   Plan:    1. Continue any current medications. 2. Wound care discussed. 3. Follow up: as scheduled.   Penny Pia, RN

## 2022-07-03 ENCOUNTER — Inpatient Hospital Stay (HOSPITAL_COMMUNITY): Payer: Medicaid Other

## 2022-07-07 ENCOUNTER — Encounter: Payer: Self-pay | Admitting: Obstetrics

## 2022-07-08 ENCOUNTER — Telehealth (HOSPITAL_COMMUNITY): Payer: Self-pay

## 2022-07-08 NOTE — Telephone Encounter (Signed)
Chart review.

## 2022-07-18 IMAGING — US US PELVIS COMPLETE WITH TRANSVAGINAL
1 series · 14 of 25 positions shown · non-contrast
Comparison: None

CLINICAL DATA: Pelvic pain in female of unspecified duration, LMP
09/08/2020

EXAM:
TRANSABDOMINAL AND TRANSVAGINAL ULTRASOUND OF PELVIS
TECHNIQUE: Both transabdominal and transvaginal ultrasound examinations of the
pelvis were performed. Transabdominal technique was performed for
global imaging of the pelvis including uterus, ovaries, adnexal
regions, and pelvic cul-de-sac. It was necessary to proceed with
endovaginal exam following the transabdominal exam to visualize the
endometrium and ovaries.

[Series 1: us pelvis complete with transvaginal · 14 of 70 slices shown]
[im 1/70]
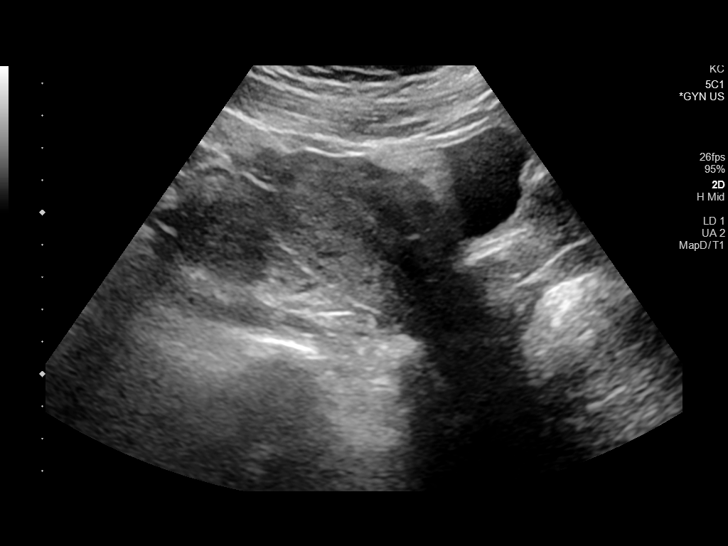
[im 6/70]
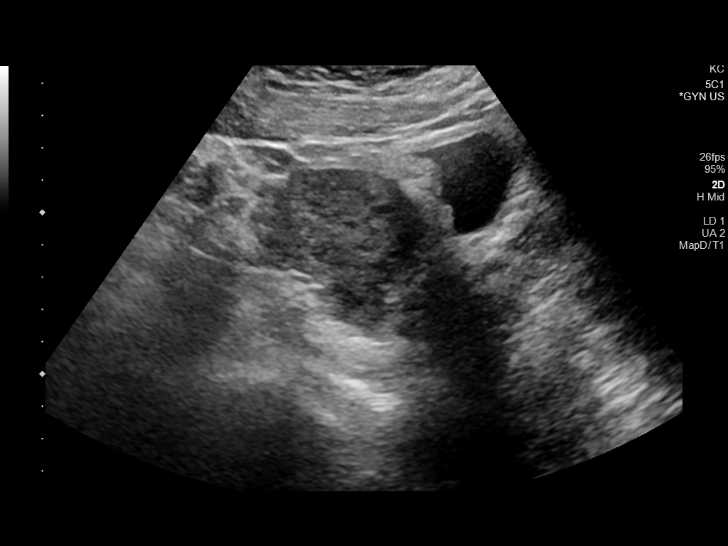
[im 12/70]
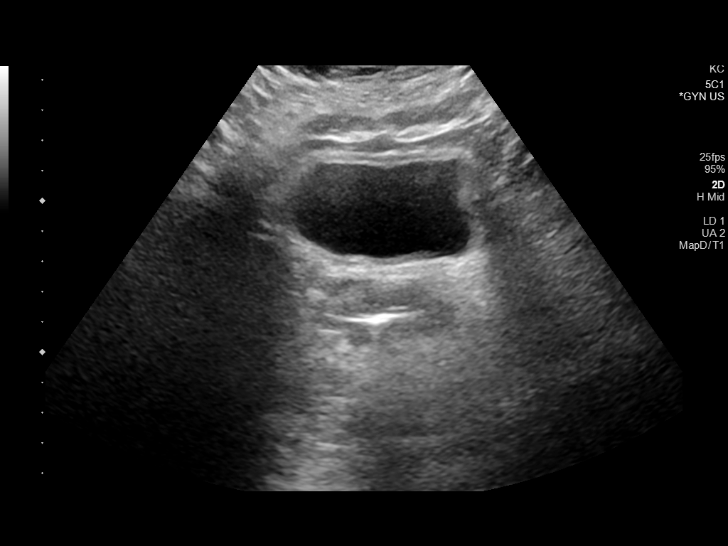
[im 18/70]
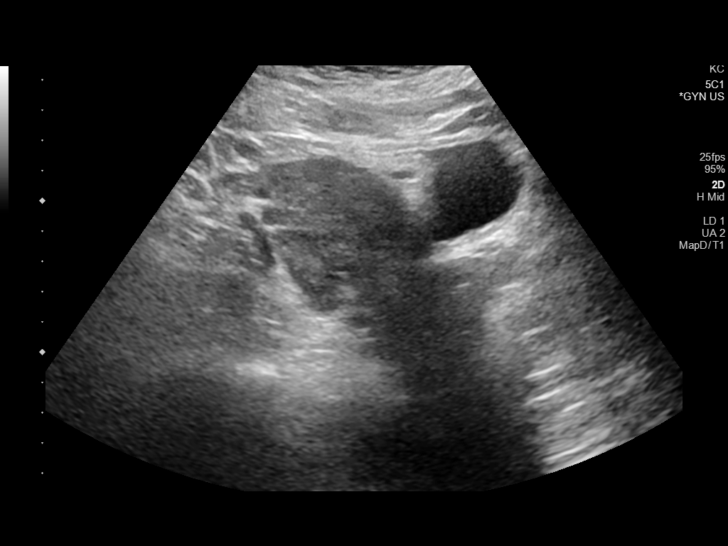
[im 24/70]
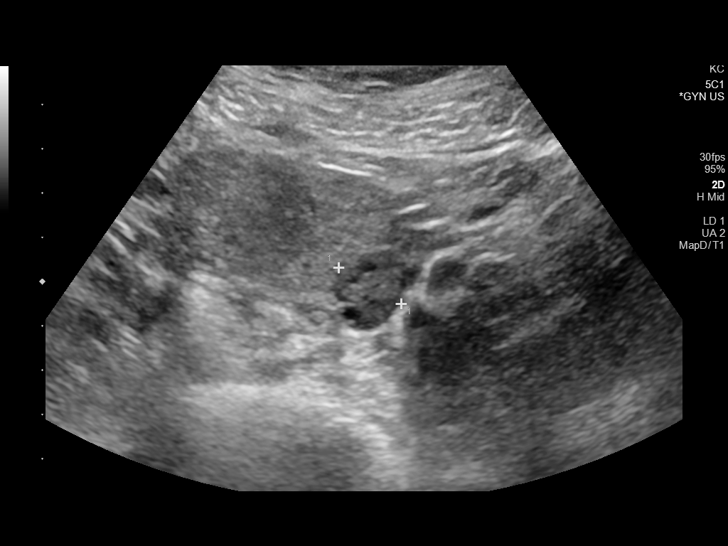
[im 26/70]
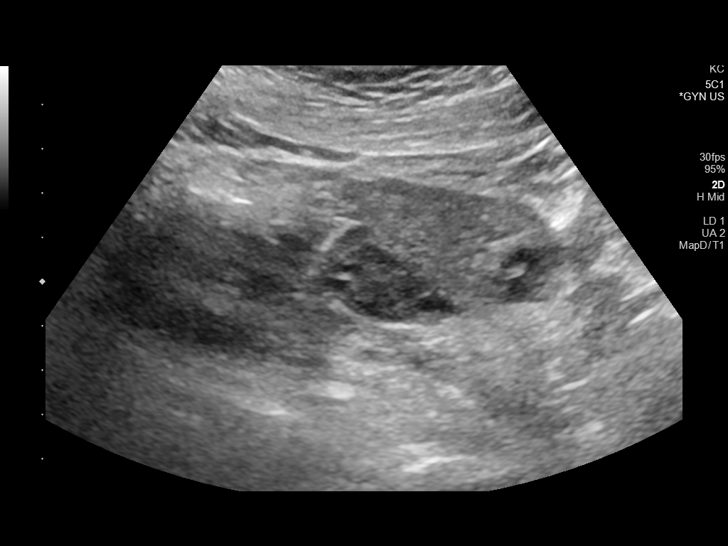
[im 32/70]
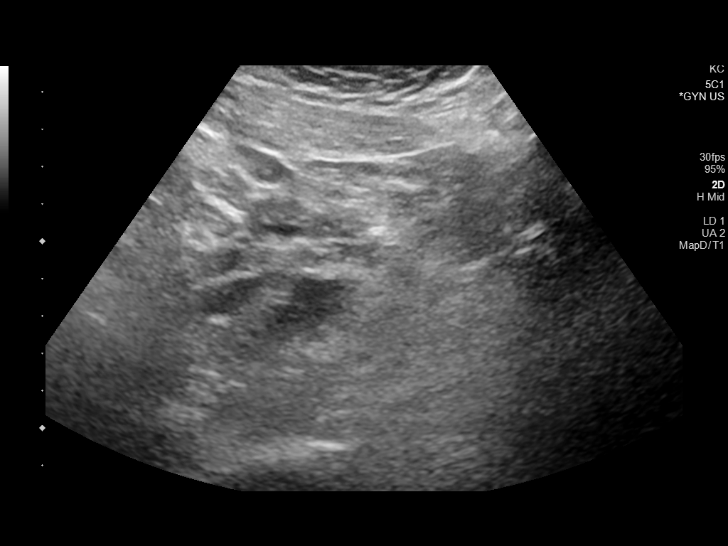
[im 38/70]
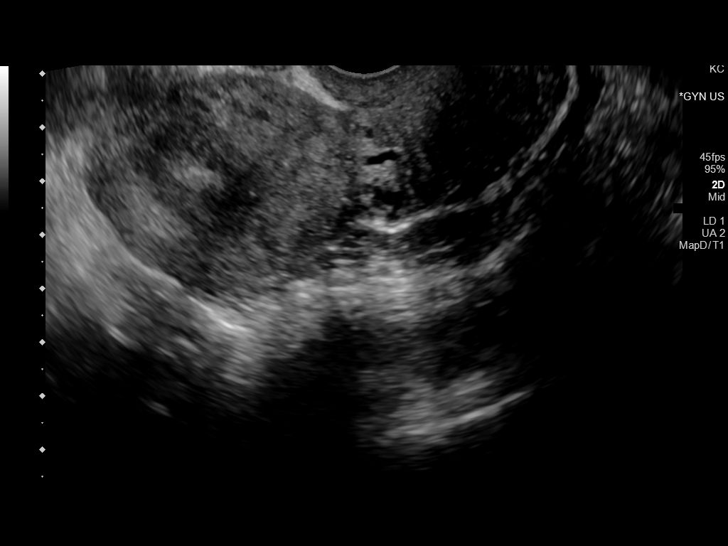
[im 44/70]
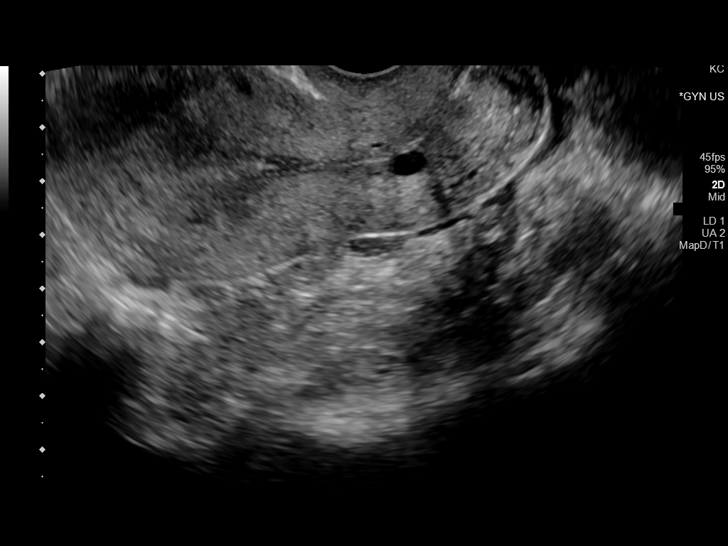
[im 47/70]
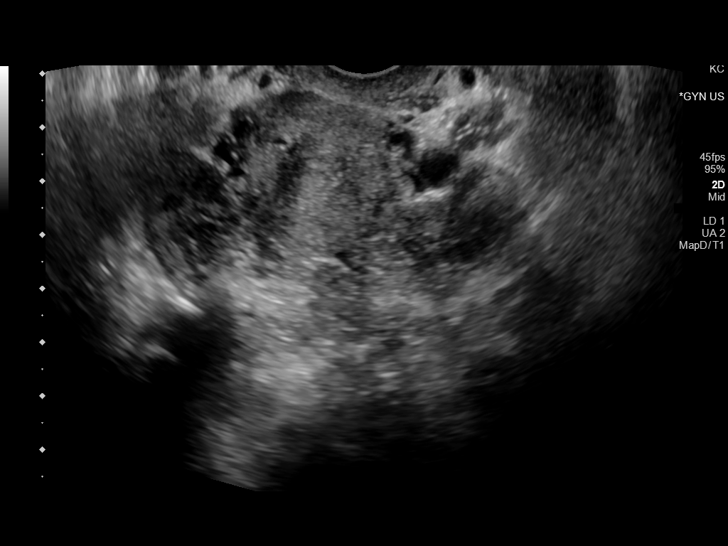
[im 52/70]
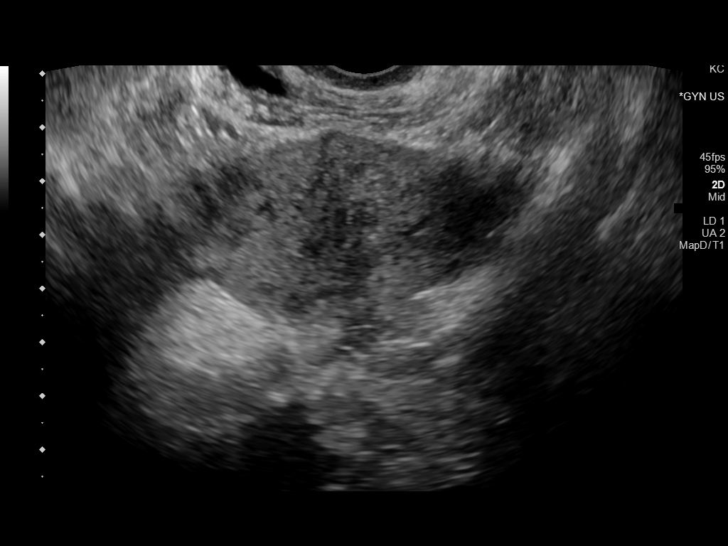
[im 58/70]
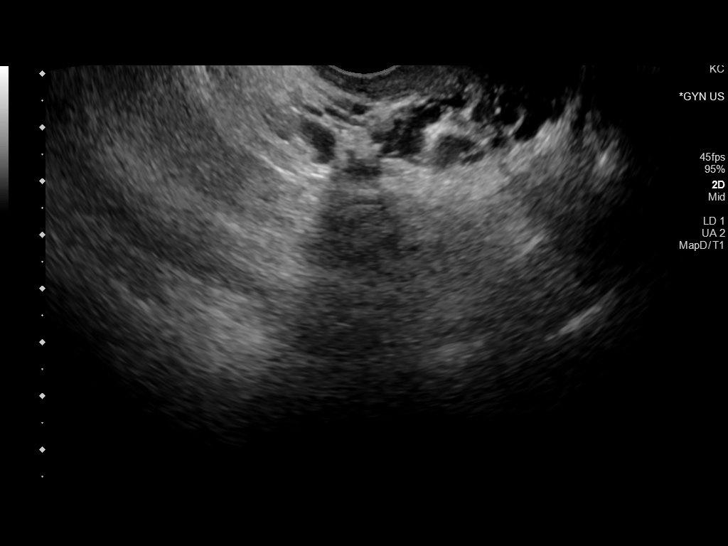
[im 64/70]
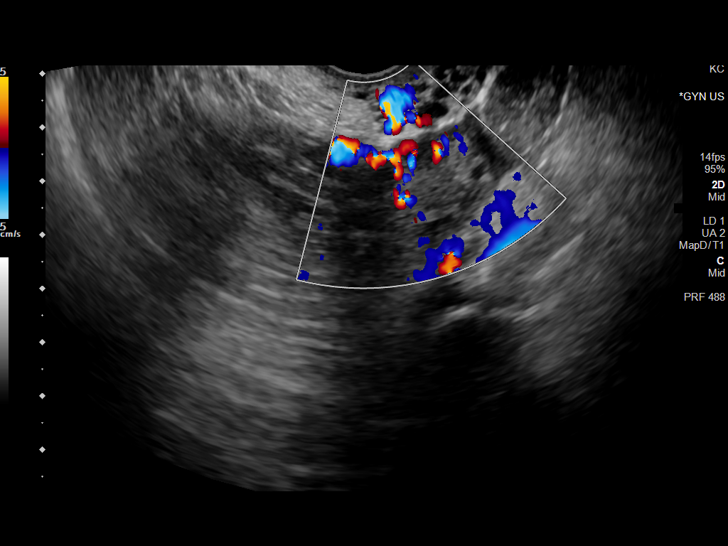
[im 70/70]
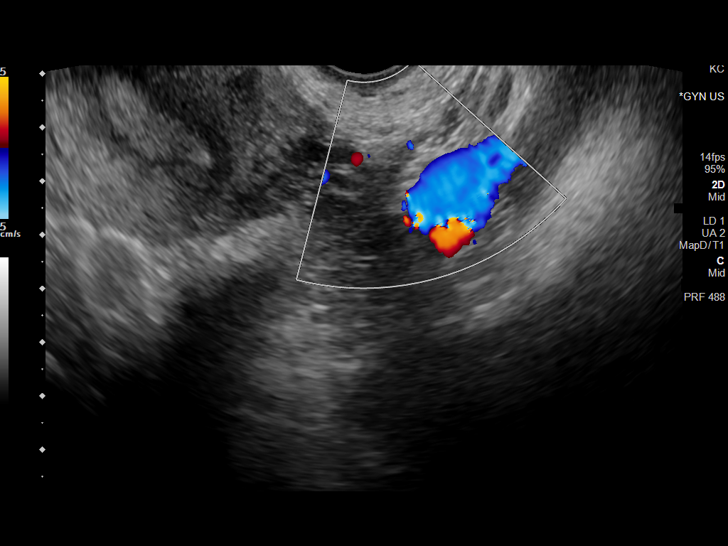

[14 of 25 positions shown; findings below may reference images not displayed]

FINDINGS: Uterus

Measurements: 9.6 x 4.2 x 5.0 cm = volume: 105 mL. Anteverted.
Heterogeneous myometrium. No focal mass.

Endometrium

Thickness: 11 mm. Nonspecific trace endometrial fluid. No focal
mass.

Right ovary

Measurements: 3.6 x 2.2 x 2.2 cm = volume: 9.1 mL. Normal morphology
without mass

Left ovary

Measurements: 3.8 x 2.2 x 1.8 cm = volume: 7.6 mL. Normal morphology
without mass

Other findings

No free pelvic fluid.  No adnexal masses.
IMPRESSION: Trace nonspecific endometrial fluid.

Otherwise negative exam.

## 2022-07-24 ENCOUNTER — Other Ambulatory Visit: Payer: Self-pay | Admitting: Obstetrics and Gynecology

## 2022-07-24 DIAGNOSIS — O26891 Other specified pregnancy related conditions, first trimester: Secondary | ICD-10-CM

## 2022-08-09 ENCOUNTER — Ambulatory Visit: Payer: Medicaid Other | Admitting: Obstetrics and Gynecology

## 2022-08-09 ENCOUNTER — Other Ambulatory Visit: Payer: Medicaid Other

## 2022-08-18 ENCOUNTER — Other Ambulatory Visit: Payer: Medicaid Other

## 2022-08-18 ENCOUNTER — Ambulatory Visit: Payer: Medicaid Other | Admitting: Obstetrics and Gynecology

## 2022-09-16 ENCOUNTER — Ambulatory Visit (INDEPENDENT_AMBULATORY_CARE_PROVIDER_SITE_OTHER): Payer: Medicaid Other | Admitting: Obstetrics & Gynecology

## 2022-09-16 VITALS — BP 105/73 | HR 71 | Ht 61.0 in | Wt 179.6 lb

## 2022-09-16 DIAGNOSIS — Z30011 Encounter for initial prescription of contraceptive pills: Secondary | ICD-10-CM

## 2022-09-16 MED ORDER — NORGESTIM-ETH ESTRAD TRIPHASIC 0.18/0.215/0.25 MG-25 MCG PO TABS: 1.0000 | ORAL_TABLET | Freq: Every day | ORAL | 11 refills | Status: DC

## 2022-09-16 NOTE — Progress Notes (Signed)
    Post Partum Visit Note  Sara Higgins is a 32 y.o. G20P2012 female who presents for a postpartum visit. She is  12  weeks postpartum following a primary cesarean section.  I have fully reviewed the prenatal and intrapartum course. The delivery was at 39 gestational weeks.  Anesthesia: epidural. Postpartum course has been uncomplicated. Baby is doing well/ yes. Baby is feeding by bottle - Similac Advance. Bleeding no bleeding. Bowel function is normal. Bladder function is normal. Patient is sexually active. Contraception method is condoms. Postpartum depression screening: negative.   The pregnancy intention screening data noted above was reviewed. Potential methods of contraception were discussed. The patient elected to proceed with No data recorded.    Health Maintenance Due  Topic Date Due   COVID-19 Vaccine (1) Never done    The following portions of the patient's history were reviewed and updated as appropriate: allergies, current medications, past family history, past medical history, past social history, past surgical history, and problem list.  Review of Systems Pertinent items are noted in HPI.  Objective:  LMP 09/24/2021    General:  alert, cooperative, and no distress   Breasts:  not indicated  Lungs:   Heart:  regular rate and rhythm  Abdomen: soft, non-tender; bowel sounds normal; no masses,  no organomegaly   Wound well approximated incision  GU exam:  not indicated       Assessment:    There are no diagnoses linked to this encounter.  Normal postpartum exam.   Plan:   Essential components of care per ACOG recommendations:  1.  Mood and well being: Patient with negative depression screening today. Reviewed local resources for support.  - Patient tobacco use? No.   - hx of drug use? No.    2. Infant care and feeding:  -Patient currently breastmilk feeding? No.  -Social determinants of health (SDOH) reviewed in EPIC. No concerns  3. Sexuality,  contraception and birth spacing - Patient does not want a pregnancy in the next year.  Desired family size is 3 children.  - Reviewed reproductive life planning. Reviewed contraceptive methods based on pt preferences and effectiveness.  Patient desired Oral Contraceptive today.   - Discussed birth spacing of 18 months  4. Sleep and fatigue -Encouraged family/partner/community support of 4 hrs of uninterrupted sleep to help with mood and fatigue  5. Physical Recovery  - Discussed patients delivery and complications. She describes her labor as mixed. - Patient had a C-section failure to progress. - Patient has urinary incontinence? No. - Patient is safe to resume physical and sexual activity  6.  Health Maintenance - HM due items addressed Yes - Last pap smear  Diagnosis  Date Value Ref Range Status  10/21/2020   Final   - Negative for Intraepithelial Lesions or Malignancy (NILM)  10/21/2020 - Benign reactive/reparative changes  Final   Pap smear not done at today's visit.  -Breast Cancer screening indicated? No.   7. Chronic Disease/Pregnancy Condition follow up: Gestational Diabetes  - PCP follow up  Adam Phenix, MD [facility], [facility]

## 2022-10-11 ENCOUNTER — Other Ambulatory Visit: Payer: Medicaid Other

## 2022-12-22 ENCOUNTER — Ambulatory Visit (INDEPENDENT_AMBULATORY_CARE_PROVIDER_SITE_OTHER): Payer: Medicaid Other | Admitting: Obstetrics and Gynecology

## 2022-12-22 VITALS — BP 123/81 | HR 83 | Ht 61.0 in | Wt 181.3 lb

## 2022-12-22 DIAGNOSIS — Z3009 Encounter for other general counseling and advice on contraception: Secondary | ICD-10-CM | POA: Diagnosis not present

## 2022-12-22 DIAGNOSIS — Z3202 Encounter for pregnancy test, result negative: Secondary | ICD-10-CM | POA: Diagnosis not present

## 2022-12-22 LAB — POCT URINE PREGNANCY: Preg Test, Ur: NEGATIVE

## 2022-12-22 MED ORDER — TWIRLA 120-30 MCG/24HR TD PTWK: MEDICATED_PATCH | TRANSDERMAL | 11 refills | Status: AC

## 2022-12-22 NOTE — Patient Instructions (Signed)

## 2022-12-22 NOTE — Progress Notes (Signed)
   GYNECOLOGY PROGRESS NOTE  History:  32 y.o. [id number] presents to [facility] office today for contraception. She reports she was considering IUD, but unsure. Concerned about birth control d/t it taking so long to get pregnant after depo last time.  The following portions of the patient's history were reviewed and updated as appropriate: allergies, current medications, past family history, past medical history, past social history, past surgical history and problem list. Last pap smear on 10/21/20 was normal  Health Maintenance Due  Topic Date Due   INFLUENZA VACCINE  Never done   COVID-19 Vaccine (1 - 2023-24 season) Never done     Review of Systems:  Pertinent items are noted in HPI.   Objective:  Physical Exam Blood pressure 123/81, pulse 83, height 5\' 1"  (1.549 m), weight 181 lb 4.8 oz (82.2 kg), not currently breastfeeding. VS reviewed, nursing note reviewed,  Constitutional: well developed, well nourished, no distress HEENT: normocephalic CV: normal rate Pulm/chest wall: normal effort Breast Exam: deferred Abdomen: soft Neuro: alert and oriented  Skin: warm, dry Psych: affect normal Pelvic exam: deferred  Assessment & Plan:   1. Birth control counseling Went through hormonal and non hormonal options, discussed future fertility. Would like to do patch. Discussed initiation, how often to change, back up method  - POCT urine pregnancy  Future Appointments  Date Time Provider Department Center  12/28/2022 10:40 AM Avanell Shackleton, NP-C [facility] None  01/18/2023  9:35 AM Hermina Staggers, MD [facility] None     Albertine Grates, FNP

## 2022-12-28 ENCOUNTER — Ambulatory Visit: Payer: Medicaid Other | Admitting: Family Medicine

## 2022-12-28 ENCOUNTER — Encounter: Payer: Self-pay | Admitting: Family Medicine

## 2022-12-28 VITALS — BP 96/62 | HR 80 | Temp 97.6°F | Ht 61.0 in | Wt 181.0 lb

## 2022-12-28 DIAGNOSIS — R5383 Other fatigue: Secondary | ICD-10-CM | POA: Diagnosis not present

## 2022-12-28 DIAGNOSIS — Z8632 Personal history of gestational diabetes: Secondary | ICD-10-CM

## 2022-12-28 DIAGNOSIS — Z8349 Family history of other endocrine, nutritional and metabolic diseases: Secondary | ICD-10-CM

## 2022-12-28 DIAGNOSIS — Z8639 Personal history of other endocrine, nutritional and metabolic disease: Secondary | ICD-10-CM | POA: Diagnosis not present

## 2022-12-28 DIAGNOSIS — E669 Obesity, unspecified: Secondary | ICD-10-CM

## 2022-12-28 DIAGNOSIS — K219 Gastro-esophageal reflux disease without esophagitis: Secondary | ICD-10-CM

## 2022-12-28 LAB — CBC WITH DIFFERENTIAL/PLATELET
Basophils Absolute: 0.1 10*3/uL (ref 0.0–0.1)
Basophils Relative: 0.8 % (ref 0.0–3.0)
Eosinophils Absolute: 0.5 10*3/uL (ref 0.0–0.7)
Eosinophils Relative: 6.9 % — ABNORMAL HIGH (ref 0.0–5.0)
HCT: 41.3 % (ref 36.0–46.0)
Hemoglobin: 13 g/dL (ref 12.0–15.0)
Lymphocytes Relative: 28.8 % (ref 12.0–46.0)
Lymphs Abs: 2.2 10*3/uL (ref 0.7–4.0)
MCHC: 31.5 g/dL (ref 30.0–36.0)
MCV: 78.6 fl (ref 78.0–100.0)
Monocytes Absolute: 0.7 10*3/uL (ref 0.1–1.0)
Monocytes Relative: 9.8 % (ref 3.0–12.0)
Neutro Abs: 4.1 10*3/uL (ref 1.4–7.7)
Neutrophils Relative %: 53.7 % (ref 43.0–77.0)
Platelets: 301 10*3/uL (ref 150.0–400.0)
RBC: 5.26 Mil/uL — ABNORMAL HIGH (ref 3.87–5.11)
RDW: 15.2 % (ref 11.5–15.5)
WBC: 7.5 10*3/uL (ref 4.0–10.5)

## 2022-12-28 LAB — COMPREHENSIVE METABOLIC PANEL
ALT: 15 U/L (ref 0–35)
AST: 12 U/L (ref 0–37)
Albumin: 4 g/dL (ref 3.5–5.2)
Alkaline Phosphatase: 67 U/L (ref 39–117)
BUN: 9 mg/dL (ref 6–23)
CO2: 28 mEq/L (ref 19–32)
Calcium: 9.3 mg/dL (ref 8.4–10.5)
Chloride: 104 mEq/L (ref 96–112)
Creatinine, Ser: 0.95 mg/dL (ref 0.40–1.20)
GFR: 78.58 mL/min (ref >60.00)
Glucose, Bld: 86 mg/dL (ref 70–99)
Potassium: 3.7 mEq/L (ref 3.5–5.1)
Sodium: 139 mEq/L (ref 135–145)
Total Bilirubin: 0.4 mg/dL (ref 0.2–1.2)
Total Protein: 7.2 g/dL (ref 6.0–8.3)

## 2022-12-28 LAB — VITAMIN B12: Vitamin B-12: 382 pg/mL (ref 211–911)

## 2022-12-28 LAB — T3, FREE: T3, Free: 2.9 pg/mL (ref 2.3–4.2)

## 2022-12-28 LAB — T4, FREE: Free T4: 0.77 ng/dL (ref 0.60–1.60)

## 2022-12-28 LAB — VITAMIN D 25 HYDROXY (VIT D DEFICIENCY, FRACTURES): VITD: 16.44 ng/mL — ABNORMAL LOW (ref 30.00–100.00)

## 2022-12-28 LAB — FOLATE: Folate: 9.2 ng/mL (ref >5.9)

## 2022-12-28 LAB — HEMOGLOBIN A1C: Hgb A1c MFr Bld: 6 % (ref 4.6–6.5)

## 2022-12-28 LAB — TSH: TSH: 0.34 u[IU]/mL — ABNORMAL LOW (ref 0.35–5.50)

## 2022-12-28 NOTE — Progress Notes (Signed)
New Patient Office Visit  Subjective    Patient ID: Sara Higgins, female    DOB: September 26, 1990  Age: 32 y.o. MRN: [id number]  CC:  Chief Complaint  Patient presents with   Establish Care    No previous pcp    HPI Sara Higgins presents to establish care  No PCP OB/GYN- [facility]   C/o feeling tired.   Reports hx of abnormal thyroid function. States her mother had to have her thyroid removed.   She has a 38 month old.  She is not breastfeeding.   Periods are regular.   2 kids, oldest one is 8 years old  Engaged   She is in school for business administration.  Licensed nail tech  Outpatient Encounter Medications as of 12/28/2022  Medication Sig   famotidine (PEPCID) 20 MG tablet TAKE 1 TABLET(20 MG) BY MOUTH TWICE DAILY   TWIRLA 120-30 MCG/24HR PTWK Apply one new patch on the same day each week. Followed by one week that is patch free.   [DISCONTINUED] cyclobenzaprine (FLEXERIL) 5 MG tablet Take 1-2 tablets (5-10 mg total) by mouth 3 (three) times daily as needed for muscle spasms. (Patient not taking: Reported on 12/22/2022)   [DISCONTINUED] furosemide (LASIX) 20 MG tablet Take 1 tablet (20 mg total) by mouth daily for 4 days.   [DISCONTINUED] ibuprofen (ADVIL) 600 MG tablet Take 1 tablet (600 mg total) by mouth every 6 (six) hours as needed. (Patient not taking: Reported on 12/22/2022)   [DISCONTINUED] oxyCODONE (OXY IR/ROXICODONE) 5 MG immediate release tablet Take 1-2 tablets (5-10 mg total) by mouth every 6 (six) hours as needed for moderate pain. (Patient not taking: Reported on 12/22/2022)   [DISCONTINUED] oxyCODONE-acetaminophen (PERCOCET/ROXICET) 5-325 MG tablet Take 1 tablet by mouth every 4 (four) hours as needed for severe pain.   [DISCONTINUED] Prenatal MV & Min w/FA-DHA (PRENATAL ADULT GUMMY/DHA/FA) 0.4-25 MG CHEW Chew 1 Units by mouth daily.   No facility-administered encounter medications on file as of 12/28/2022.    Past Medical History:  Diagnosis  Date   Acid reflux    History of chlamydia 07/2014   History of trichomoniasis 07/2014   Vaginal Pap smear, abnormal     Past Surgical History:  Procedure Laterality Date   CESAREAN SECTION N/A 06/24/2022   Procedure: CESAREAN SECTION;  Surgeon: Warden Fillers, MD;  Location: [facility] LD ORS;  Service: Obstetrics;  Laterality: N/A;   WISDOM TOOTH EXTRACTION      Family History  Problem Relation Age of Onset   Diabetes Paternal Grandmother    Hypertension Paternal Grandmother    Cancer Paternal Grandmother        Ovarian   Hypertension Other    Asthma Neg Hx    Heart disease Neg Hx    Stroke Neg Hx     Social History   Socioeconomic History   Marital status: Single    Spouse name: Not on file   Number of children: Not on file   Years of education: Not on file   Highest education level: Not on file  Occupational History   Not on file  Tobacco Use   Smoking status: Never   Smokeless tobacco: Never  Vaping Use   Vaping status: Former   Substances: Nicotine, Flavoring  Substance and Sexual Activity   Alcohol use: Not Currently   Drug use: Never   Sexual activity: Not Currently    Birth control/protection: None  Other Topics Concern   Not on file  Social  History Narrative   Not on file   Social Determinants of Health   Financial Resource Strain: Not on file  Food Insecurity: No Food Insecurity (06/23/2022)   Hunger Vital Sign    Worried About Running Out of Food in the Last Year: Never true    Ran Out of Food in the Last Year: Never true  Transportation Needs: No Transportation Needs (06/23/2022)   PRAPARE - Administrator, Civil Service (Medical): No    Lack of Transportation (Non-Medical): No  Physical Activity: Sufficiently Active (02/15/2017)   Exercise Vital Sign    Days of Exercise per Week: 5 days    Minutes of Exercise per Session: 30 min  Stress: Stress Concern Present (02/15/2017)   [employer] of Occupational Health - Occupational  Stress Questionnaire    Feeling of Stress : To some extent  Social Connections: Unknown (11/27/2021)   Received from [facility]   Social Network    Social Network: Not on file  Intimate Partner Violence: Not At Risk (06/23/2022)   Humiliation, Afraid, Rape, and Kick questionnaire    Fear of Current or Ex-Partner: No    Emotionally Abused: No    Physically Abused: No    Sexually Abused: No    Review of Systems  Constitutional:  Negative for chills and fever.  Respiratory:  Negative for shortness of breath.   Cardiovascular:  Negative for chest pain, palpitations and leg swelling.  Gastrointestinal:  Negative for abdominal pain, constipation, diarrhea, nausea and vomiting.  Genitourinary:  Negative for dysuria, frequency and urgency.  Neurological:  Negative for dizziness, focal weakness and headaches.  Psychiatric/Behavioral:  Negative for depression. The patient is not nervous/anxious.         Objective    BP 96/62 (BP Location: Left Arm, Patient Position: Sitting, Cuff Size: Large)   Pulse 80   Temp 97.6 F (36.4 C) (Temporal)   Ht 5\' 1"  (1.549 m)   Wt 181 lb (82.1 kg)   SpO2 100%   BMI 34.20 kg/m   Physical Exam Constitutional:      General: She is not in acute distress.    Appearance: She is not ill-appearing.  Eyes:     Extraocular Movements: Extraocular movements intact.     Conjunctiva/sclera: Conjunctivae normal.  Cardiovascular:     Rate and Rhythm: Normal rate.  Pulmonary:     Effort: Pulmonary effort is normal.  Musculoskeletal:     Cervical back: Normal range of motion and neck supple.  Skin:    General: Skin is warm and dry.  Neurological:     General: No focal deficit present.     Mental Status: She is alert and oriented to person, place, and time.  Psychiatric:        Mood and Affect: Mood normal.        Behavior: Behavior normal.        Thought Content: Thought content normal.         Assessment & Plan:   Problem List  Items Addressed This Visit   None Visit Diagnoses     Fatigue, unspecified type    -  Primary   Relevant Orders   CBC with Differential/Platelet (Completed)   Comprehensive metabolic panel (Completed)   Folate (Completed)   TSH (Completed)   T4, free (Completed)   T3, free (Completed)   Vitamin B12 (Completed)   VITAMIN D 25 Hydroxy (Vit-D Deficiency, Fractures) (Completed)   Iron, TIBC and Ferritin Panel  Obesity (BMI 30-39.9)       Relevant Orders   TSH (Completed)   T4, free (Completed)   T3, free (Completed)   Hemoglobin A1c (Completed)   History of thyroid disorder       Relevant Orders   TSH (Completed)   T4, free (Completed)   T3, free (Completed)   Family history of thyroid disease in mother       Relevant Orders   TSH (Completed)   T4, free (Completed)   T3, free (Completed)   Gastroesophageal reflux disease, unspecified whether esophagitis present       History of gestational diabetes       Relevant Orders   CBC with Differential/Platelet (Completed)   Comprehensive metabolic panel (Completed)   Hemoglobin A1c (Completed)      Pleasant new patient. Check labs due to hx of thyroid disorder. Look for underlying etiology for fatigue. Continue prn Pepcid for GERD. Lifestyle measures recommended.  Cut sugar and carbohydrates for weight loss.  On OCPs. Is not breastfeeding. Follow up pending labs.   Return for pending labs.   Hetty Blend, NP-C

## 2022-12-28 NOTE — Patient Instructions (Signed)
Please go downstairs for labs and we will be in touch with your results.

## 2022-12-29 LAB — IRON,TIBC AND FERRITIN PANEL
%SAT: 23 % (calc) (ref 16–45)
Ferritin: 35 ng/mL (ref 16–154)
Iron: 96 ug/dL (ref 40–190)
TIBC: 413 mcg/dL (calc) (ref 250–450)

## 2022-12-29 NOTE — Progress Notes (Signed)
Please see my note and schedule her a 3 month follow up ov. Fasting preferable.

## 2023-01-18 ENCOUNTER — Ambulatory Visit: Payer: Medicaid Other | Admitting: Obstetrics and Gynecology

## 2023-01-20 ENCOUNTER — Ambulatory Visit: Payer: Medicaid Other | Admitting: Family Medicine
